# Patient Record
Sex: Female | Born: 1985 | Race: Black or African American | Hispanic: No | Smoking: Former smoker
Health system: Southern US, Community
[De-identification: ages and names within clinical notes are randomized; demographics above are authoritative.]

## PROBLEM LIST (undated history)

## (undated) ENCOUNTER — Inpatient Hospital Stay (HOSPITAL_COMMUNITY): Payer: Managed Care, Other (non HMO)

## (undated) DIAGNOSIS — F419 Anxiety disorder, unspecified: Secondary | ICD-10-CM

## (undated) DIAGNOSIS — F32A Depression, unspecified: Secondary | ICD-10-CM

## (undated) DIAGNOSIS — R569 Unspecified convulsions: Secondary | ICD-10-CM

## (undated) DIAGNOSIS — O139 Gestational [pregnancy-induced] hypertension without significant proteinuria, unspecified trimester: Secondary | ICD-10-CM

## (undated) DIAGNOSIS — F319 Bipolar disorder, unspecified: Secondary | ICD-10-CM

## (undated) DIAGNOSIS — F209 Schizophrenia, unspecified: Secondary | ICD-10-CM

## (undated) HISTORY — PX: ADENOIDECTOMY: SUR15

## (undated) HISTORY — DX: Schizophrenia, unspecified: F20.9

## (undated) HISTORY — DX: Bipolar disorder, unspecified: F31.9

---
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
## 2007-03-05 ENCOUNTER — Emergency Department (HOSPITAL_COMMUNITY): Admission: EM | Admit: 2007-03-05 | Discharge: 2007-03-05 | Payer: Self-pay | Admitting: Emergency Medicine

## 2007-08-20 ENCOUNTER — Emergency Department (HOSPITAL_COMMUNITY): Admission: EM | Admit: 2007-08-20 | Discharge: 2007-08-21 | Payer: Self-pay | Admitting: Emergency Medicine

## 2008-01-02 ENCOUNTER — Inpatient Hospital Stay (HOSPITAL_COMMUNITY): Admission: AD | Admit: 2008-01-02 | Discharge: 2008-01-02 | Payer: Self-pay | Admitting: Obstetrics & Gynecology

## 2008-08-13 ENCOUNTER — Inpatient Hospital Stay (HOSPITAL_COMMUNITY): Admission: AD | Admit: 2008-08-13 | Discharge: 2008-08-13 | Payer: Self-pay | Admitting: Obstetrics

## 2008-08-18 ENCOUNTER — Inpatient Hospital Stay (HOSPITAL_COMMUNITY): Admission: AD | Admit: 2008-08-18 | Discharge: 2008-08-19 | Payer: Self-pay | Admitting: Obstetrics

## 2008-08-19 ENCOUNTER — Inpatient Hospital Stay (HOSPITAL_COMMUNITY): Admission: AD | Admit: 2008-08-19 | Discharge: 2008-08-22 | Payer: Self-pay | Admitting: Obstetrics

## 2008-09-04 ENCOUNTER — Inpatient Hospital Stay (HOSPITAL_COMMUNITY): Admission: AD | Admit: 2008-09-04 | Discharge: 2008-09-08 | Payer: Self-pay | Admitting: Obstetrics

## 2009-02-24 ENCOUNTER — Emergency Department (HOSPITAL_COMMUNITY): Admission: EM | Admit: 2009-02-24 | Discharge: 2009-02-24 | Payer: Self-pay | Admitting: Emergency Medicine

## 2009-03-22 ENCOUNTER — Emergency Department (HOSPITAL_COMMUNITY): Admission: EM | Admit: 2009-03-22 | Discharge: 2009-03-22 | Payer: Self-pay | Admitting: Emergency Medicine

## 2009-06-02 ENCOUNTER — Emergency Department (HOSPITAL_COMMUNITY): Admission: EM | Admit: 2009-06-02 | Discharge: 2009-06-02 | Payer: Self-pay | Admitting: Emergency Medicine

## 2009-07-02 ENCOUNTER — Emergency Department (HOSPITAL_COMMUNITY): Admission: EM | Admit: 2009-07-02 | Discharge: 2009-07-02 | Payer: Self-pay | Admitting: Emergency Medicine

## 2009-08-22 ENCOUNTER — Emergency Department (HOSPITAL_COMMUNITY): Admission: EM | Admit: 2009-08-22 | Discharge: 2009-08-22 | Payer: Self-pay | Admitting: Emergency Medicine

## 2009-10-18 ENCOUNTER — Emergency Department (HOSPITAL_COMMUNITY): Admission: EM | Admit: 2009-10-18 | Discharge: 2009-10-18 | Payer: Self-pay | Admitting: Emergency Medicine

## 2010-03-23 ENCOUNTER — Emergency Department (HOSPITAL_COMMUNITY): Admission: EM | Admit: 2010-03-23 | Discharge: 2010-03-23 | Payer: Self-pay | Admitting: Emergency Medicine

## 2010-06-28 ENCOUNTER — Emergency Department (HOSPITAL_COMMUNITY): Admission: EM | Admit: 2010-06-28 | Discharge: 2010-06-28 | Payer: Self-pay | Admitting: Emergency Medicine

## 2010-08-21 ENCOUNTER — Emergency Department (HOSPITAL_COMMUNITY): Admission: EM | Admit: 2010-08-21 | Discharge: 2010-08-21 | Payer: Self-pay | Admitting: Emergency Medicine

## 2010-09-13 ENCOUNTER — Emergency Department (HOSPITAL_COMMUNITY): Admission: EM | Admit: 2010-09-13 | Discharge: 2010-09-13 | Payer: Self-pay | Admitting: Emergency Medicine

## 2010-10-14 ENCOUNTER — Emergency Department (HOSPITAL_COMMUNITY)
Admission: EM | Admit: 2010-10-14 | Discharge: 2010-10-14 | Payer: Self-pay | Source: Home / Self Care | Admitting: Emergency Medicine

## 2010-11-07 ENCOUNTER — Emergency Department (HOSPITAL_COMMUNITY)
Admission: EM | Admit: 2010-11-07 | Discharge: 2010-11-07 | Payer: Self-pay | Source: Home / Self Care | Admitting: Emergency Medicine

## 2010-12-11 ENCOUNTER — Emergency Department (HOSPITAL_COMMUNITY)
Admission: EM | Admit: 2010-12-11 | Discharge: 2010-12-11 | Discharge status: Against Medical Advice | Payer: Self-pay | Attending: Emergency Medicine | Admitting: Emergency Medicine

## 2010-12-11 DIAGNOSIS — R197 Diarrhea, unspecified: Secondary | ICD-10-CM | POA: Insufficient documentation

## 2011-01-05 ENCOUNTER — Emergency Department (HOSPITAL_COMMUNITY)
Admission: EM | Admit: 2011-01-05 | Discharge: 2011-01-06 | Disposition: A | Discharge status: Home / Self Care | Payer: Self-pay | Attending: Emergency Medicine | Admitting: Emergency Medicine

## 2011-01-05 DIAGNOSIS — Z0389 Encounter for observation for other suspected diseases and conditions ruled out: Secondary | ICD-10-CM | POA: Insufficient documentation

## 2011-01-14 LAB — RAPID STREP SCREEN (MED CTR MEBANE ONLY): Streptococcus, Group A Screen (Direct): NEGATIVE

## 2011-03-15 NOTE — H&P (Signed)
NAME:  Kathy Bird, Kathy Bird                  ACCOUNT NO.:  [id number]   MEDICAL RECORD NO.:  [id number]          PATIENT TYPE:  INP   LOCATION:  9156                          FACILITY:  [facility]   PHYSICIAN:  Roseanna Rainbow, M.D.DATE OF BIRTH:  20-Dec-1985   DATE OF ADMISSION:  09/04/2008  DATE OF DISCHARGE:                              HISTORY & PHYSICAL   CHIEF COMPLAINT:  The patient is a 25 year old several days post  discharge from her recent delivery, now with a new-onset headache.   HISTORY OF PRESENT ILLNESS:  Please see the above.  She denies any other  neurological symptoms.  Her recent delivery was uncomplicated.   PAST MEDICAL HISTORY:  There is a history of a seizure disorder.   SOCIAL HISTORY:  She is a current smoker.  She denies any drug abuse or  alcohol use.   PAST GYNECOLOGIC HISTORY:  Noncontributory.   MEDICATIONS:  Please see the medication reconciliation form.   REVIEW OF SYSTEMS:  NEUROLOGIC:  Headache.  No visual disturbances.  GI:  No epigastric pain.  PULMONARY:  No shortness of breath.   PHYSICAL EXAMINATION:  VITAL SIGNS:  Blood pressures 150-160/100-110.  GENERAL:  Mild distress.   PIH panel normal.   ASSESSMENT:  Postpartum pregnancy-induced hypertension with blood  pressure criteria for severe pregnancy-induced hypertension.   PLAN:  Admission.  Magnesium sulfate for seizure prophylaxis.  We will  start p.o. labetalol and give IV labetalol as needed for severe blood  pressure elevations.  Modified bed rest.      Roseanna Rainbow, M.D.  Electronically Signed     LAJ/MEDQ  D:  09/05/2008  T:  09/05/2008  Job:  [id number]   cc:   Kathreen Cosier, M.D.  Fax: [phone]

## 2011-03-18 NOTE — Discharge Summary (Signed)
NAME:  Kathy Bird, Kathy Bird                  ACCOUNT NO.:  [id number]   MEDICAL RECORD NO.:  [id number]          PATIENT TYPE:  INP   LOCATION:  9306                          FACILITY:  [facility]   PHYSICIAN:  Kathreen Cosier, M.D.DATE OF BIRTH:  23-Dec-1985   DATE OF ADMISSION:  09/04/2008  DATE OF DISCHARGE:  09/08/2008                               DISCHARGE SUMMARY   The patient is a 25 year old who was delivered and then readmitted  because of elevated blood pressures.  On admission, her blood pressures  were 150 to 160 over 100 to 110 and the PIH panel was normal.  She was  admitted and received magnesium sulfate 4 g loading, 2 g per hour.  She  was started on p.o. labetalol 200 mg p.o. b.i.d.  By September 05, 2008,  highest diastolic was 89 and labetalol was increased the night before it  was 3 times a day.  Magnesium was discontinued on September 06, 2008, and  highest blood pressure was 140/82.  On September 07, 2008, her blood  pressure was 129/59 to 158/99 and she was restarted on her Norvasc 10 mg  p.o. daily that she had been taking prior to pregnancy.  By September 08, 2008, her diastolics ranged between 71 to 84.  She was discharged home  on Norvasc 10 mg p.o. daily, labetalol 200 mg every 8 hours, to see me  in 1 week for blood pressure check.   DISCHARGE DIAGNOSIS:  Status post readmission for pregnancy-induced  hypertension postdelivery.           ______________________________  Kathreen Cosier, M.D.     BAM/MEDQ  D:  10/08/2008  T:  10/08/2008  Job:  [id number]

## 2011-04-29 ENCOUNTER — Inpatient Hospital Stay (HOSPITAL_COMMUNITY)
Admission: AD | Admit: 2011-04-29 | Discharge: 2011-04-29 | Disposition: A | Discharge status: Home / Self Care | Payer: Self-pay | Source: Ambulatory Visit | Attending: Obstetrics & Gynecology | Admitting: Obstetrics & Gynecology

## 2011-04-29 DIAGNOSIS — O210 Mild hyperemesis gravidarum: Secondary | ICD-10-CM

## 2011-04-29 DIAGNOSIS — R197 Diarrhea, unspecified: Secondary | ICD-10-CM | POA: Insufficient documentation

## 2011-04-29 LAB — URINALYSIS, ROUTINE W REFLEX MICROSCOPIC
Nitrite: NEGATIVE
Protein, ur: NEGATIVE mg/dL
Specific Gravity, Urine: 1.02 (ref 1.005–1.030)
Urobilinogen, UA: 1 mg/dL (ref 0.0–1.0)

## 2011-04-29 LAB — URINE MICROSCOPIC-ADD ON

## 2011-04-29 LAB — POCT PREGNANCY, URINE
Preg Test, Ur: POSITIVE
Preg Test, Ur: POSITIVE

## 2011-05-24 ENCOUNTER — Inpatient Hospital Stay (INDEPENDENT_AMBULATORY_CARE_PROVIDER_SITE_OTHER)
Admission: RE | Admit: 2011-05-24 | Discharge: 2011-05-24 | Disposition: A | Discharge status: Home / Self Care | Payer: Self-pay | Source: Ambulatory Visit | Attending: Emergency Medicine | Admitting: Emergency Medicine

## 2011-05-24 DIAGNOSIS — R109 Unspecified abdominal pain: Secondary | ICD-10-CM

## 2011-06-13 ENCOUNTER — Encounter (HOSPITAL_COMMUNITY): Payer: Self-pay

## 2011-06-13 ENCOUNTER — Inpatient Hospital Stay (HOSPITAL_COMMUNITY)
Admission: AD | Admit: 2011-06-13 | Discharge: 2011-06-13 | Disposition: A | Discharge status: Home / Self Care | Payer: Self-pay | Source: Ambulatory Visit | Attending: Obstetrics & Gynecology | Admitting: Obstetrics & Gynecology

## 2011-06-13 DIAGNOSIS — F411 Generalized anxiety disorder: Secondary | ICD-10-CM | POA: Insufficient documentation

## 2011-06-13 DIAGNOSIS — F419 Anxiety disorder, unspecified: Secondary | ICD-10-CM

## 2011-06-13 HISTORY — DX: Unspecified convulsions: R56.9

## 2011-06-13 LAB — CBC
HCT: 35.3 % — ABNORMAL LOW (ref 36.0–46.0)
Hemoglobin: 11 g/dL — ABNORMAL LOW (ref 12.0–15.0)
MCH: 26.4 pg (ref 26.0–34.0)
MCHC: 31.2 g/dL (ref 30.0–36.0)
MCV: 84.9 fL (ref 78.0–100.0)
RDW: 14.3 % (ref 11.5–15.5)

## 2011-06-13 LAB — COMPREHENSIVE METABOLIC PANEL
Albumin: 4 g/dL (ref 3.5–5.2)
Alkaline Phosphatase: 65 U/L (ref 39–117)
BUN: 10 mg/dL (ref 6–23)
Creatinine, Ser: 0.6 mg/dL (ref 0.50–1.10)
GFR calc Af Amer: >60 mL/min (ref >60)
Glucose, Bld: 92 mg/dL (ref 70–99)
Potassium: 3.8 mEq/L (ref 3.5–5.1)
Total Protein: 7.9 g/dL (ref 6.0–8.3)

## 2011-06-13 LAB — POCT PREGNANCY, URINE: Preg Test, Ur: NEGATIVE

## 2011-06-13 MED ORDER — ALPRAZOLAM 0.5 MG PO TABS: 0.5000 mg | ORAL_TABLET | Freq: Every evening | ORAL | Status: AC | PRN

## 2011-06-13 MED ORDER — SERTRALINE HCL 50 MG PO TABS: 50.0000 mg | ORAL_TABLET | Freq: Every day | ORAL | Status: DC

## 2011-06-13 NOTE — Progress Notes (Signed)
Pt states she had a terminated pregnancy on 7-5. States she did not go back for her follow up appointment. Pt states that every morning she wakes up with a rapid pulse, hearing things, anxiety and nervous and "feels like she is having a heart attack". No pain. Has had some spotting off and on.

## 2011-06-13 NOTE — ED Provider Notes (Signed)
History     No chief complaint on file.  HPI  Pt had TAB on 05/05/11. Since that time has been waking up with feeling of racing heartbeat and shortness of breath accompanied by feeling of anxiety, "I feel like I'm having a having a heart attack". Also happens when out around a lot of people. Is not having these symptoms now. States she has been feeling "down" since abortion. Did not go to follow up appointment because she "didn't want to go back to that place". No bleeding or pain.   OB History    Grav Para Term Preterm Abortions TAB SAB Ect Mult Living   2 1 1  0 1 1 0 0 0 1      Past Medical History  Diagnosis Date  . 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 last seizure when 39yrs old    History reviewed. No pertinent past surgical history.  No family history on file.  History  Substance Use Topics  . Smoking status: Current Everyday Smoker -- 0.2 packs/day for 5 years    Types: Cigarettes  . Smokeless tobacco: Never Used  . Alcohol Use: Yes     socially    Allergies: No Known Allergies  No prescriptions prior to admission    ROS  Negative except as noted in HPI Physical Exam   Blood pressure 132/93, pulse 107, temperature 97.9 F (36.6 C), temperature source Oral, resp. rate 16, height 5' 4.5" (1.638 m), weight 65.681 kg (144 lb 12.8 oz), last menstrual period 06/07/2011, SpO2 100.00%.  Physical Exam  Constitutional: She is oriented to person, place, and time. She appears well-developed and well-nourished. No distress.  Cardiovascular: Normal rate, regular rhythm and normal heart sounds.  Exam reveals no gallop and no friction rub.   No murmur heard. Respiratory: Effort normal and breath sounds normal. No respiratory distress.  Musculoskeletal: Normal range of motion.  Neurological: She is alert and oriented to person, place, and time.  Skin: Skin is warm and dry.  Psychiatric: She has a normal mood and affect. Her behavior is normal. Judgment and thought content normal.     MAU Course  Procedures   Results for orders placed during the hospital encounter of 06/13/11 (from the past 24 hour(s))  CBC     Status: Abnormal   Collection Time   06/13/11  7:07 PM      Component Value Range   WBC 5.6  4.0 - 10.5 (K/uL)   RBC 4.16  3.87 - 5.11 (MIL/uL)   Hemoglobin 11.0 (*) 12.0 - 15.0 (g/dL)   HCT 16.1 (*) 09.6 - 46.0 (%)   MCV 84.9  78.0 - 100.0 (fL)   MCH 26.4  26.0 - 34.0 (pg)   MCHC 31.2  30.0 - 36.0 (g/dL)   RDW 04.5  40.9 - 81.1 (%)   Platelets 326  150 - 400 (K/uL)  COMPREHENSIVE METABOLIC PANEL     Status: Normal   Collection Time   06/13/11  7:07 PM      Component Value Range   Sodium 140  135 - 145 (mEq/L)   Potassium 3.8  3.5 - 5.1 (mEq/L)   Chloride 105  96 - 112 (mEq/L)   CO2 27  19 - 32 (mEq/L)   Glucose, Bld 92  70 - 99 (mg/dL)   BUN 10  6 - 23 (mg/dL)   Creatinine, Ser 9.14  0.50 - 1.10 (mg/dL)   Calcium 78.2  8.4 - 10.5 (mg/dL)   Total Protein  7.9  6.0 - 8.3 (g/dL)   Albumin 4.0  3.5 - 5.2 (g/dL)   AST 14  0 - 37 (U/L)   ALT 10  0 - 35 (U/L)   Alkaline Phosphatase 65  39 - 117 (U/L)   Total Bilirubin 0.4  0.3 - 1.2 (mg/dL)   GFR calc non Af Amer >60  >60 (mL/min)   GFR calc Af Amer >60  >60 (mL/min)  POCT PREGNANCY, URINE     Status: Normal   Collection Time   06/13/11  8:09 PM      Component Value Range   Preg Test, Ur NEGATIVE       Assessment and Plan  Complete Termination Anxiety: Rx Zoloft 50mg  po Qhs, Xanax .5mg  TID prn, bridge to therapeutic FU with me in [facility] 4-6 weeks  Lucilla Petrenko E. 06/13/2011, 8:20 PM

## 2011-06-13 NOTE — ED Provider Notes (Signed)
History     No chief complaint on file.  HPI Pt had TAB on 05/05/11. Since that time has been waking up with feeling of racing heartbeat and shortness of breath accompanied by feeling of anxiety, "I feel like I'm having a having a heart attack". Also happens when out around a lot of people. Is not having these symptoms now. States she has been feeling "down" since abortion. Did not go to follow up appointment because she "didn't want to go back to that place". No bleeding or pain.   OB History    Grav Para Term Preterm Abortions TAB SAB Ect Mult Living   2 1 1  0 1 1 0 0 0 1      Past Medical History  Diagnosis Date  . 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 last seizure when 40yrs old    History reviewed. No pertinent past surgical history.  No family history on file.  History  Substance Use Topics  . Smoking status: Current Everyday Smoker -- 0.2 packs/day for 5 years    Types: Cigarettes  . Smokeless tobacco: Never Used  . Alcohol Use: Yes     socially    Allergies: No Known Allergies  No prescriptions prior to admission    ROS Negative except as noted in HPI Physical Exam   Blood pressure 126/76, pulse 83, temperature 97.9 F (36.6 C), temperature source Oral, resp. rate 20, height 5' 4.5" (1.638 m), weight 65.681 kg (144 lb 12.8 oz), last menstrual period 06/07/2011, SpO2 100.00%.  Physical Exam  Constitutional: She is oriented to person, place, and time. She appears well-developed and well-nourished. No distress.  Cardiovascular: Normal rate, regular rhythm and normal heart sounds.  Exam reveals no gallop and no friction rub.   No murmur heard. Respiratory: Effort normal and breath sounds normal. No respiratory distress.  Musculoskeletal: Normal range of motion.  Neurological: She is alert and oriented to person, place, and time.  Skin: Skin is warm and dry.  Psychiatric: She has a normal mood and affect. Her behavior is normal. Judgment and thought content normal.    MAU  Course  Procedures  Results for orders placed during the hospital encounter of 06/13/11 (from the past 24 hour(s))  CBC     Status: Abnormal   Collection Time   06/13/11  7:07 PM      Component Value Range   WBC 5.6  4.0 - 10.5 (K/uL)   RBC 4.16  3.87 - 5.11 (MIL/uL)   Hemoglobin 11.0 (*) 12.0 - 15.0 (g/dL)   HCT 16.1 (*) 09.6 - 46.0 (%)   MCV 84.9  78.0 - 100.0 (fL)   MCH 26.4  26.0 - 34.0 (pg)   MCHC 31.2  30.0 - 36.0 (g/dL)   RDW 04.5  40.9 - 81.1 (%)   Platelets 326  150 - 400 (K/uL)  COMPREHENSIVE METABOLIC PANEL     Status: Normal   Collection Time   06/13/11  7:07 PM      Component Value Range   Sodium 140  135 - 145 (mEq/L)   Potassium 3.8  3.5 - 5.1 (mEq/L)   Chloride 105  96 - 112 (mEq/L)   CO2 27  19 - 32 (mEq/L)   Glucose, Bld 92  70 - 99 (mg/dL)   BUN 10  6 - 23 (mg/dL)   Creatinine, Ser 9.14  0.50 - 1.10 (mg/dL)   Calcium 78.2  8.4 - 10.5 (mg/dL)   Total Protein 7.9  6.0 -  8.3 (g/dL)   Albumin 4.0  3.5 - 5.2 (g/dL)   AST 14  0 - 37 (U/L)   ALT 10  0 - 35 (U/L)   Alkaline Phosphatase 65  39 - 117 (U/L)   Total Bilirubin 0.4  0.3 - 1.2 (mg/dL)   GFR calc non Af Amer >60  >60 (mL/min)   GFR calc Af Amer >60  >60 (mL/min)  POCT PREGNANCY, URINE     Status: Normal   Collection Time   06/13/11  8:09 PM      Component Value Range   Preg Test, Ur NEGATIVE       Assessment and Plan  Discussed anxiety/panic attacks with patient - encouraged mental health follow up Care transferred to [facility], CNM  Sanaa Zilberman 06/14/2011, 5:02 PM

## 2011-07-14 ENCOUNTER — Encounter: Payer: Self-pay | Admitting: Obstetrics and Gynecology

## 2011-07-25 LAB — WET PREP, GENITAL

## 2011-07-25 LAB — URINALYSIS, ROUTINE W REFLEX MICROSCOPIC
Glucose, UA: NEGATIVE
Hgb urine dipstick: NEGATIVE
Ketones, ur: NEGATIVE
Protein, ur: NEGATIVE
Urobilinogen, UA: 0.2

## 2011-07-25 LAB — HCG, QUANTITATIVE, PREGNANCY: hCG, Beta Chain, Quant, S: 115832 — ABNORMAL HIGH

## 2011-07-25 LAB — CBC
HCT: 33.4 — ABNORMAL LOW
MCV: 78.3
Platelets: 306
RDW: 17 — ABNORMAL HIGH

## 2011-07-25 LAB — POCT PREGNANCY, URINE: Preg Test, Ur: POSITIVE

## 2011-08-01 LAB — CBC
HCT: 24.7 — ABNORMAL LOW
Hemoglobin: 10.1 — ABNORMAL LOW
Hemoglobin: 7.9 — CL
MCV: 79.3
RBC: 3.12 — ABNORMAL LOW
RBC: 4.09
WBC: 8.6

## 2011-08-02 LAB — CBC
HCT: 36.1
Hemoglobin: 11.1 — ABNORMAL LOW
MCHC: 30.7
MCV: 79.7
RBC: 4.53
RDW: 19.2 — ABNORMAL HIGH

## 2011-08-02 LAB — URINALYSIS, ROUTINE W REFLEX MICROSCOPIC
Nitrite: NEGATIVE
Protein, ur: NEGATIVE
Specific Gravity, Urine: 1.025
Urobilinogen, UA: 0.2

## 2011-08-02 LAB — COMPREHENSIVE METABOLIC PANEL
ALT: 15
BUN: 6
CO2: 20
Calcium: 8.9
Creatinine, Ser: 0.51
GFR calc non Af Amer: >60
Glucose, Bld: 92
Sodium: 134 — ABNORMAL LOW
Total Protein: 7.3

## 2011-08-02 LAB — URINE MICROSCOPIC-ADD ON

## 2011-08-02 LAB — LACTATE DEHYDROGENASE: LDH: 257 — ABNORMAL HIGH

## 2011-08-02 LAB — URIC ACID: Uric Acid, Serum: 4.6

## 2011-08-10 LAB — URINALYSIS, ROUTINE W REFLEX MICROSCOPIC
Ketones, ur: NEGATIVE
Nitrite: NEGATIVE
Protein, ur: NEGATIVE
pH: 6.5

## 2011-08-10 LAB — URINE CULTURE

## 2011-08-10 LAB — WET PREP, GENITAL
Trich, Wet Prep: NONE SEEN
WBC, Wet Prep HPF POC: NONE SEEN

## 2011-09-20 ENCOUNTER — Encounter (HOSPITAL_COMMUNITY): Payer: Self-pay | Admitting: Emergency Medicine

## 2011-09-20 ENCOUNTER — Encounter (HOSPITAL_COMMUNITY): Payer: Self-pay | Admitting: *Deleted

## 2011-09-20 ENCOUNTER — Emergency Department (HOSPITAL_COMMUNITY)
Admission: EM | Admit: 2011-09-20 | Discharge: 2011-09-20 | Disposition: A | Discharge status: Home / Self Care | Payer: Self-pay | Attending: Emergency Medicine | Admitting: Emergency Medicine

## 2011-09-20 ENCOUNTER — Emergency Department (HOSPITAL_COMMUNITY)
Admission: EM | Admit: 2011-09-20 | Discharge: 2011-09-20 | Discharge status: Against Medical Advice | Payer: Self-pay | Attending: Emergency Medicine | Admitting: Emergency Medicine

## 2011-09-20 DIAGNOSIS — X58XXXA Exposure to other specified factors, initial encounter: Secondary | ICD-10-CM | POA: Insufficient documentation

## 2011-09-20 DIAGNOSIS — K0889 Other specified disorders of teeth and supporting structures: Secondary | ICD-10-CM

## 2011-09-20 DIAGNOSIS — S025XXA Fracture of tooth (traumatic), initial encounter for closed fracture: Secondary | ICD-10-CM | POA: Insufficient documentation

## 2011-09-20 DIAGNOSIS — S032XXA Dislocation of tooth, initial encounter: Secondary | ICD-10-CM

## 2011-09-20 DIAGNOSIS — K089 Disorder of teeth and supporting structures, unspecified: Secondary | ICD-10-CM | POA: Insufficient documentation

## 2011-09-20 DIAGNOSIS — R6884 Jaw pain: Secondary | ICD-10-CM | POA: Insufficient documentation

## 2011-09-20 MED ORDER — OXYCODONE-ACETAMINOPHEN 5-325 MG PO TABS: ORAL_TABLET | ORAL | Status: AC

## 2011-09-20 MED ORDER — OXYCODONE-ACETAMINOPHEN 5-325 MG PO TABS
2.0000 | ORAL_TABLET | Freq: Once | ORAL | Status: AC
  Administered 2011-09-20: 2 via ORAL
  Filled 2011-09-20: qty 2

## 2011-09-20 MED ORDER — PENICILLIN V POTASSIUM 500 MG PO TABS: 500.0000 mg | ORAL_TABLET | Freq: Four times a day (QID) | ORAL | Status: AC

## 2011-09-20 NOTE — ED Provider Notes (Signed)
History     CSN: [id number] Arrival date & time: 09/20/2011  6:06 AM   First MD Initiated Contact with Patient 09/20/11 [phone]      Chief Complaint  Patient presents with  . Dental Pain    generalized jaw pain     (Consider location/radiation/quality/duration/timing/severity/associated sxs/prior treatment) HPI Comments: Patient states she was unable to sleep last evening because of pain in her upper molar area bilaterally. She states that her molars broke through her skin and she was supposed to get them extracted in the interim her insurance has been dropped gets a job change and she is unable to get her extractions. Since then pieces of both molars have fallen off. She states excruciating throbbing pain is causing her to have a headache. She is able to swallow has no trismus and her palate is soft. Patient denies fevers, night sweats, chills. She has no other current complaints.  Patient is a 25 y.o. female presenting with tooth pain. The history is provided by the patient.  Dental PainPrimary symptoms do not include sore throat.  Additional symptoms do not include: facial swelling, trouble swallowing, drooling and ear pain.    Past Medical History  Diagnosis Date  . 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 last seizure when 64yrs old    History reviewed. No pertinent past surgical history.  History reviewed. No pertinent family history.  History  Substance Use Topics  . Smoking status: Current Everyday Smoker -- 0.2 packs/day for 5 years    Types: Cigarettes  . Smokeless tobacco: Never Used  . Alcohol Use: Yes     socially    OB History    Grav Para Term Preterm Abortions TAB SAB Ect Mult Living   2 1 1  0 1 1 0 0 0 1      Review of Systems  HENT: Positive for dental problem. Negative for ear pain, congestion, sore throat, facial swelling, drooling, mouth sores, trouble swallowing, neck pain, neck stiffness and voice change.   All other systems reviewed and are  negative.    Allergies  Review of patient's allergies indicates no known allergies.  Home Medications   Current Outpatient Rx  Name Route Sig Dispense Refill  . IBUPROFEN 200 MG PO TABS Oral Take 400 mg by mouth every 6 (six) hours as needed. As needed for pain     . NAPROXEN SODIUM 220 MG PO TABS Oral Take 220 mg by mouth 2 (two) times daily with a meal. For pain     . OVER THE COUNTER MEDICATION  OTC Dayquil product     . SERTRALINE HCL 50 MG PO TABS Oral Take 1 tablet (50 mg total) by mouth daily. 30 tablet 1    BP 128/84  Pulse 90  Temp(Src) 97.5 F (36.4 C) (Oral)  Resp 20  SpO2 100%  Physical Exam  Nursing note and vitals reviewed. Constitutional: She is oriented to person, place, and time. She appears well-developed and well-nourished. No distress.  HENT:  Head: Normocephalic and atraumatic. No trismus in the jaw.  Mouth/Throat: Uvula is midline, oropharynx is clear and moist and mucous membranes are normal. Abnormal dentition. No dental abscesses or uvula swelling. No oropharyngeal exudate, posterior oropharyngeal edema, posterior oropharyngeal erythema or tonsillar abscesses.    Eyes: Conjunctivae and EOM are normal.  Neck: Normal range of motion and full passive range of motion without pain. Neck supple.  Cardiovascular: Normal rate and regular rhythm.   Pulmonary/Chest: Effort normal and breath sounds normal.  No stridor.  Musculoskeletal: Normal range of motion.  Lymphadenopathy:       Head (right side): No submental, no submandibular, no tonsillar, no preauricular and no posterior auricular adenopathy present.       Head (left side): No submental, no submandibular, no tonsillar, no preauricular and no posterior auricular adenopathy present.    She has no cervical adenopathy.  Neurological: She is alert and oriented to person, place, and time.  Skin: Skin is warm and dry. No rash noted. She is not diaphoretic.    ED Course  Procedures (including critical care  time)  Labs Reviewed - No data to display No results found.   No diagnosis found.    MDM  Dental pain        Jaci Carrel, [place] 09/20/11 [phone]

## 2011-09-20 NOTE — ED Notes (Signed)
Pt in c/o toothache x2 days, pt states pain is on both sides of her upper jaw in the back

## 2011-09-20 NOTE — ED Provider Notes (Signed)
Medical screening examination/treatment/procedure(s) were performed by non-physician practitioner and as supervising physician I was immediately available for consultation/collaboration.   Vida Roller, MD 09/20/11 [phone]

## 2011-11-21 ENCOUNTER — Emergency Department (HOSPITAL_COMMUNITY)
Admission: EM | Admit: 2011-11-21 | Discharge: 2011-11-21 | Disposition: A | Discharge status: Home / Self Care | Payer: Medicaid Other | Attending: Emergency Medicine | Admitting: Emergency Medicine

## 2011-11-21 ENCOUNTER — Encounter (HOSPITAL_COMMUNITY): Payer: Self-pay

## 2011-11-21 DIAGNOSIS — M542 Cervicalgia: Secondary | ICD-10-CM | POA: Insufficient documentation

## 2011-11-21 DIAGNOSIS — R51 Headache: Secondary | ICD-10-CM | POA: Insufficient documentation

## 2011-11-21 DIAGNOSIS — R6884 Jaw pain: Secondary | ICD-10-CM | POA: Insufficient documentation

## 2011-11-21 DIAGNOSIS — J329 Chronic sinusitis, unspecified: Secondary | ICD-10-CM

## 2011-11-21 DIAGNOSIS — K029 Dental caries, unspecified: Secondary | ICD-10-CM | POA: Insufficient documentation

## 2011-11-21 DIAGNOSIS — R569 Unspecified convulsions: Secondary | ICD-10-CM | POA: Insufficient documentation

## 2011-11-21 MED ORDER — HYDROCODONE-ACETAMINOPHEN 5-325 MG PO TABS
1.0000 | ORAL_TABLET | Freq: Once | ORAL | Status: AC
  Administered 2011-11-21: 1 via ORAL
  Filled 2011-11-21: qty 1

## 2011-11-21 MED ORDER — AMOXICILLIN-POT CLAVULANATE 875-125 MG PO TABS
1.0000 | ORAL_TABLET | ORAL | Status: AC
  Administered 2011-11-21: 1 via ORAL
  Filled 2011-11-21 (×2): qty 1

## 2011-11-21 MED ORDER — AMOXICILLIN-POT CLAVULANATE 875-125 MG PO TABS: 1.0000 | ORAL_TABLET | Freq: Two times a day (BID) | ORAL | Status: AC

## 2011-11-21 MED ORDER — HYDROCODONE-ACETAMINOPHEN 5-325 MG PO TABS: 1.0000 | ORAL_TABLET | ORAL | Status: AC | PRN

## 2011-11-21 NOTE — ED Provider Notes (Signed)
History     CSN: [id number]  Arrival date & time 11/21/11  0039   First MD Initiated Contact with Patient 11/21/11 0047      Chief Complaint  Patient presents with  . Jaw Pain    right side  . Neck Pain    right side    (Consider location/radiation/quality/duration/timing/severity/associated sxs/prior treatment) HPI Comments: Patient here with a several month history of left sided facial and neck pain - states that initially she thought the pain was related to a dental issue - states the pain now involves her entire face (forehead sparing) - she denies throat pain, fever, chills, ear pain or drainage - reports she does have some cavities but has not been able to see a dentist because of insurance issues   Patient is a 26 y.o. female presenting with neck pain. The history is provided by the patient. No language interpreter was used.  Neck Pain  This is a recurrent problem. The current episode started more than 1 week ago. The problem occurs constantly. The problem has not changed since onset.The pain is associated with nothing. There has been no fever. The pain is present in the left side. The quality of the pain is described as stabbing. The pain is at a severity of 10/10. The pain is severe. The pain is the same all the time. Stiffness is present all day. Pertinent negatives include no photophobia, no visual change, no chest pain, no syncope, no numbness, no weight loss, no headaches, no bowel incontinence, no bladder incontinence, no leg pain, no paresis, no tingling and no weakness. She has tried NSAIDs for the symptoms. The treatment provided no relief.    Past Medical History  Diagnosis Date  . 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 last seizure when 76yrs old    History reviewed. No pertinent past surgical history.  No family history on file.  History  Substance Use Topics  . Smoking status: Current Everyday Smoker -- 0.2 packs/day for 5 years    Types: Cigarettes  . Smokeless tobacco:  Never Used  . Alcohol Use: Yes     socially    OB History    Grav Para Term Preterm Abortions TAB SAB Ect Mult Living   2 1 1  0 1 1 0 0 0 1      Review of Systems  Constitutional: Negative for weight loss.  HENT: Positive for congestion, neck pain and sinus pressure. Negative for ear pain, facial swelling, trouble swallowing and dental problem.   Eyes: Negative for photophobia.  Cardiovascular: Negative for chest pain and syncope.  Gastrointestinal: Negative for bowel incontinence.  Genitourinary: Negative for bladder incontinence.  Neurological: Negative for tingling, weakness, numbness and headaches.  All other systems reviewed and are negative.    Allergies  Review of patient's allergies indicates no known allergies.  Home Medications   Current Outpatient Rx  Name Route Sig Dispense Refill  . IBUPROFEN 200 MG PO TABS Oral Take 400 mg by mouth every 6 (six) hours as needed. As needed for pain     . OVER THE COUNTER MEDICATION  OTC Dayquil product     . SERTRALINE HCL 50 MG PO TABS Oral Take 1 tablet (50 mg total) by mouth daily. 30 tablet 1    BP 133/81  Pulse 78  Temp(Src) 97.8 F (36.6 C) (Oral)  Resp 16  SpO2 100%  LMP 11/12/2011  Physical Exam  Nursing note and vitals reviewed. Constitutional: She is oriented to person,  place, and time. She appears well-developed and well-nourished. No distress.  HENT:  Head: Normocephalic and atraumatic.  Right Ear: External ear normal.  Left Ear: External ear normal.  Nose: Mucosal edema present. Right sinus exhibits no maxillary sinus tenderness and no frontal sinus tenderness. Left sinus exhibits maxillary sinus tenderness. Left sinus exhibits no frontal sinus tenderness.  Mouth/Throat: Oropharynx is clear and moist. Dental caries present.         Dental caries  Neck: Normal range of motion. Neck supple.       Left anterior cervical lymphadenopathy  Cardiovascular: Normal rate, regular rhythm and normal heart sounds.   Exam reveals no gallop and no friction rub.   No murmur heard. Pulmonary/Chest: Effort normal and breath sounds normal. No respiratory distress. She exhibits no tenderness.  Abdominal: Soft. Bowel sounds are normal. There is no tenderness.  Musculoskeletal: Normal range of motion.  Lymphadenopathy:    She has cervical adenopathy.  Neurological: She is alert and oriented to person, place, and time. No cranial nerve deficit.  Skin: Skin is warm and dry.  Psychiatric: She has a normal mood and affect. Her behavior is normal. Judgment and thought content normal.    ED Course  Procedures (including critical care time)  Labs Reviewed - No data to display No results found.   Sinusitis    MDM  Though the patient has dental caries, I believe this to be more maxiallary sinusitis.       Izola [place], [place] 11/21/11 0109

## 2011-11-21 NOTE — ED Notes (Signed)
Pt states that she has been here multiple times and dx w/ dental problems. Pt states that this is not a dental problem

## 2011-11-21 NOTE — ED Provider Notes (Signed)
Medical screening examination/treatment/procedure(s) were performed by non-physician practitioner and as supervising physician I was immediately available for consultation/collaboration.  Ethelda Chick, MD 11/21/11 [phone]

## 2011-12-22 ENCOUNTER — Emergency Department (INDEPENDENT_AMBULATORY_CARE_PROVIDER_SITE_OTHER): Payer: Medicaid Other

## 2011-12-22 ENCOUNTER — Emergency Department (INDEPENDENT_AMBULATORY_CARE_PROVIDER_SITE_OTHER)
Admission: EM | Admit: 2011-12-22 | Discharge: 2011-12-22 | Disposition: A | Discharge status: Home / Self Care | Payer: Self-pay | Source: Home / Self Care | Attending: Emergency Medicine | Admitting: Emergency Medicine

## 2011-12-22 ENCOUNTER — Encounter (HOSPITAL_COMMUNITY): Payer: Self-pay | Admitting: Emergency Medicine

## 2011-12-22 DIAGNOSIS — J310 Chronic rhinitis: Secondary | ICD-10-CM

## 2011-12-22 DIAGNOSIS — K047 Periapical abscess without sinus: Secondary | ICD-10-CM

## 2011-12-22 DIAGNOSIS — K044 Acute apical periodontitis of pulpal origin: Secondary | ICD-10-CM

## 2011-12-22 DIAGNOSIS — R51 Headache: Secondary | ICD-10-CM

## 2011-12-22 MED ORDER — DOXYCYCLINE HYCLATE 100 MG PO TABS: 100.0000 mg | ORAL_TABLET | Freq: Two times a day (BID) | ORAL | Status: AC

## 2011-12-22 MED ORDER — TRAMADOL HCL 50 MG PO TABS: 100.0000 mg | ORAL_TABLET | Freq: Three times a day (TID) | ORAL | Status: AC | PRN

## 2011-12-22 MED ORDER — PREDNISONE 5 MG PO KIT: PACK | ORAL | Status: DC

## 2011-12-22 MED ORDER — LORATADINE 10 MG PO TABS: 10.0000 mg | ORAL_TABLET | Freq: Every day | ORAL | Status: DC

## 2011-12-22 NOTE — ED Provider Notes (Signed)
Chief Complaint  Patient presents with  . Facial Pain    History of Present Illness:  Kathy Bird has had a five-month history of recurring left hemicranial pain. The pain of the entire side of the head, face, ear, jaw, and left neck. When this first began in October she went to the emergency room. She was diagnosed with a dental infection and given amoxicillin. She felt a little bit better after that. She was back at the emergency room in December and diagnosed with sinusitis, again given amoxicillin felt better for a while but for the past week she's again felt worse. The pain is worse if she chews, lies on her side, and is tender to touch. Smells also make it worse such as colognes or perfumes the pain is constant and she also notes rhinorrhea, dry nose, with yellowish drainage with sometimes some blood. She denies any diplopia or blurred vision. She hasn't had any numbness, tingling, or weakness of the face, arms, or legs. No difficulty with speech or swallowing. She denies any trouble with ambulation.  Review of Systems:  Other than noted above, the patient denies any of the following symptoms: Systemic:  No fever, chills, fatigue, photophobia, stiff neck. Eye:  No redness, eye pain, discharge, blurred vision, or diplopia. ENT:  No nasal congestion, rhinorrhea, sinus pressure or pain, sneezing, earache, or sore throat. Neuro:  No paresthesias, loss of consciousness, seizure activity, muscle weakness, trouble with coordination or gait, trouble speaking or swallowing. Psych:  No depression, anxiety or trouble sleeping.  PMFSH:  Past medical history, family history, social history, meds, and allergies were reviewed.  Physical Exam:   Vital signs:  BP 129/83  Pulse 88  Temp(Src) 98.2 F (36.8 C) (Oral)  Resp 16  SpO2 100%  LMP 12/02/2011 General:  Alert and oriented.  In no distress. Eye:  Lids and conjunctivas normal.  PERRL,  Full EOMs.  Fundi benign with normal discs and vessels. ENT:  The  entire left hemicranium was tender to palpation including the face, jaw, ear, anterior, and posterior neck. There was no swelling, no masses, no rash.  TMs and canals clear.  No intra oral lesions, pharynx clear, mucous membranes moist, she has poor dentition with multiple carious teeth which are all tender to touch. There is no swelling inside the mouth or of the floor the mouth. Neck:  Supple, full ROM, no tenderness to palpation.  No adenopathy or mass. Neuro:  Alert and orented times 3.  Speech was clear, fluent, and appropriate.  Cranial nerves intact. No pronator drift, muscle strength normal. Finger to nose normal.  DTRs 2+ . Psych:  Normal affect.  Dg Sinuses Complete  12/22/2011  *RADIOLOGY REPORT*  Clinical Data: Sinus pain, pressure.  PARANASAL SINUSES - COMPLETE 3 + VIEW  Comparison: None.  Findings: Paranasal sinuses appear clear.  No air fluid levels to suggest acute sinusitis.  Bony structures unremarkable.  IMPRESSION: No evidence of sinusitis.  Original Report Authenticated By: Cyndie Chime, M.D.     Assessment:   Diagnoses that have been ruled out:  None  Diagnoses that are still under consideration:  None  Final diagnoses:  Dental infection  Rhinitis  Headache - headache could be multifactorial. Her sinus films were negative, but I'm not certain that part of it isn't due to rhinitis, partially to dental infection, and also possibly partially to facial or cranial neuritis. I think she needs followup with a dentist and a neurologist.     Plan:  1.  The following meds were prescribed:   New Prescriptions   DOXYCYCLINE (VIBRA-TABS) 100 MG TABLET    Take 1 tablet (100 mg total) by mouth 2 (two) times daily.   LORATADINE (CLARITIN) 10 MG TABLET    Take 1 tablet (10 mg total) by mouth daily.   PREDNISONE 5 MG KIT    Prednisone 5 mg 6 day dosepack.  Take as directed.   TRAMADOL (ULTRAM) 50 MG TABLET    Take 2 tablets (100 mg total) by mouth every 8 (eight) hours as needed for  pain.   2.  The patient was instructed in symptomatic care and handouts were given. 3.  The patient was told to return if becoming worse in any way, if no better in 3 or 4 days, and given some red flag symptoms that would indicate earlier return.  Follow up:  The patient was told to follow up she was given the name of Dr. Yolonda Kida, a neurologist to followup with, and also from dentistry Dr. Lucky Cowboy.     Roque Lias, MD 12/22/11 2226

## 2011-12-22 NOTE — ED Notes (Signed)
PT HERE WITH LEFT SIDE FACIAL PAIN RADIATING TO EAR AND NECK THAT STARTED X 1 WEEK AGO.PT WAS SEEN HERE X 1 MNTH AGO AND DIAG WITH SINUS INFECTION AND PRESCRIBED ATB AND PAIN MEDS BUT PT STATES NOTHING IS WORKING.PT IS TAKING OTC SUDAFED AND ALEVE CONGESTION MED BUT NO RELIEF.NO N/V/ OR FEVERS REPORTED.

## 2011-12-22 NOTE — Discharge Instructions (Signed)

## 2012-06-17 ENCOUNTER — Encounter (HOSPITAL_COMMUNITY): Payer: Self-pay | Admitting: *Deleted

## 2012-06-17 ENCOUNTER — Emergency Department (HOSPITAL_COMMUNITY)
Admission: EM | Admit: 2012-06-17 | Discharge: 2012-06-18 | Disposition: A | Discharge status: Home / Self Care | Payer: Self-pay | Attending: Emergency Medicine | Admitting: Emergency Medicine

## 2012-06-17 DIAGNOSIS — K029 Dental caries, unspecified: Secondary | ICD-10-CM | POA: Insufficient documentation

## 2012-06-17 DIAGNOSIS — F17200 Nicotine dependence, unspecified, uncomplicated: Secondary | ICD-10-CM | POA: Insufficient documentation

## 2012-06-17 NOTE — ED Notes (Signed)
Pt states that her left upper wisdom tooth has been hurting for the past 2 weeks. Pt tooth does appear blackened.

## 2012-06-18 ENCOUNTER — Emergency Department (HOSPITAL_COMMUNITY)
Admission: EM | Admit: 2012-06-18 | Discharge: 2012-06-18 | Disposition: A | Discharge status: Home / Self Care | Payer: Medicaid Other | Attending: Emergency Medicine | Admitting: Emergency Medicine

## 2012-06-18 DIAGNOSIS — Z0389 Encounter for observation for other suspected diseases and conditions ruled out: Secondary | ICD-10-CM | POA: Insufficient documentation

## 2012-06-18 MED ORDER — PENICILLIN V POTASSIUM 250 MG PO TABS
500.0000 mg | ORAL_TABLET | Freq: Once | ORAL | Status: AC
  Administered 2012-06-18: 500 mg via ORAL
  Filled 2012-06-18: qty 2

## 2012-06-18 MED ORDER — PENICILLIN V POTASSIUM 500 MG PO TABS: 500.0000 mg | ORAL_TABLET | Freq: Four times a day (QID) | ORAL | Status: AC

## 2012-06-18 MED ORDER — TRAMADOL HCL 50 MG PO TABS
50.0000 mg | ORAL_TABLET | Freq: Once | ORAL | Status: AC
  Administered 2012-06-18: 50 mg via ORAL
  Filled 2012-06-18: qty 1

## 2012-06-18 MED ORDER — TRAMADOL HCL 50 MG PO TABS: 50.0000 mg | ORAL_TABLET | Freq: Once | ORAL | Status: AC

## 2012-06-18 NOTE — ED Provider Notes (Signed)
History     CSN: [id number]  Arrival date & time 06/17/12  2345   First MD Initiated Contact with Patient 06/18/12 0009      Chief Complaint  Patient presents with  . Dental Pain    (Consider location/radiation/quality/duration/timing/severity/associated sxs/prior treatment) HPI Comments: Patient is a large cavity in her left lower second molar has become painful over the last  Patient is a 26 y.o. female presenting with tooth pain. The history is provided by the patient.  Dental PainThe primary symptoms include mouth pain. Primary symptoms do not include dental injury, headaches, fever or sore throat. The symptoms began 2 days ago. The symptoms are unchanged.  Additional symptoms do not include: trouble swallowing.    Past Medical History  Diagnosis Date  . 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 last seizure when 35yrs old    History reviewed. No pertinent past surgical history.  History reviewed. No pertinent family history.  History  Substance Use Topics  . Smoking status: Current Everyday Smoker -- 0.2 packs/day for 5 years    Types: Cigarettes  . Smokeless tobacco: Never Used  . Alcohol Use: No     socially    OB History    Grav Para Term Preterm Abortions TAB SAB Ect Mult Living   2 1 1  0 1 1 0 0 0 1      Review of Systems  Constitutional: Negative for fever.  HENT: Positive for dental problem. Negative for sore throat and trouble swallowing.   Neurological: Negative for dizziness and headaches.    Allergies  Review of patient's allergies indicates no known allergies.  Home Medications   Current Outpatient Rx  Name Route Sig Dispense Refill  . IBUPROFEN 200 MG PO TABS Oral Take 800 mg by mouth every 6 (six) hours as needed. As needed for pain      BP 125/82  Pulse 78  Temp 98.1 F (36.7 C) (Oral)  Resp 16  SpO2 100%  Physical Exam  Constitutional: She appears well-developed and well-nourished.  HENT:  Head: Normocephalic.  Mouth/Throat:    Eyes:  Pupils are equal, round, and reactive to light.  Neck: Normal range of motion.  Cardiovascular: Normal rate.   Pulmonary/Chest: Effort normal.  Musculoskeletal: Normal range of motion.  Neurological: She is alert.  Skin: Skin is warm.    ED Course  Procedures (including critical care time)  Labs Reviewed - No data to display No results found.   No diagnosis found.    MDM   Patient really recently had a similar situation with her lower teeth on the right side, and she had a dentist to extract these approximately 2 weeks, ago.  I encouraged her to make an appointment with his dentist in about one week        Arman Filter, NP 06/25/12 [phone]

## 2012-06-18 NOTE — ED Notes (Signed)
The patient is AOx4 and comfortable with her discharge instructions. 

## 2012-06-26 NOTE — ED Provider Notes (Signed)
Medical screening examination/treatment/procedure(s) were performed by non-physician practitioner and as supervising physician I was immediately available for consultation/collaboration.   Tobin Chad, MD 06/26/12 [phone]

## 2012-06-30 ENCOUNTER — Encounter (HOSPITAL_COMMUNITY): Payer: Self-pay | Admitting: Emergency Medicine

## 2012-06-30 ENCOUNTER — Emergency Department (HOSPITAL_COMMUNITY)
Admission: EM | Admit: 2012-06-30 | Discharge: 2012-06-30 | Disposition: A | Discharge status: Home / Self Care | Payer: 59 | Attending: Emergency Medicine | Admitting: Emergency Medicine

## 2012-06-30 DIAGNOSIS — T40605A Adverse effect of unspecified narcotics, initial encounter: Secondary | ICD-10-CM | POA: Insufficient documentation

## 2012-06-30 DIAGNOSIS — T7840XA Allergy, unspecified, initial encounter: Secondary | ICD-10-CM | POA: Insufficient documentation

## 2012-06-30 DIAGNOSIS — Z87891 Personal history of nicotine dependence: Secondary | ICD-10-CM | POA: Insufficient documentation

## 2012-06-30 MED ORDER — PREDNISONE 20 MG PO TABS
60.0000 mg | ORAL_TABLET | Freq: Once | ORAL | Status: AC
Start: 2012-06-30 — End: 2012-06-30
  Administered 2012-06-30: 60 mg via ORAL
  Filled 2012-06-30: qty 3

## 2012-06-30 MED ORDER — DIPHENHYDRAMINE HCL 25 MG PO TABS: 25.0000 mg | ORAL_TABLET | Freq: Four times a day (QID) | ORAL | Status: DC

## 2012-06-30 MED ORDER — RANITIDINE HCL 150 MG PO TABS: 150.0000 mg | ORAL_TABLET | Freq: Two times a day (BID) | ORAL | Status: DC

## 2012-06-30 MED ORDER — IBUPROFEN 400 MG PO TABS
600.0000 mg | ORAL_TABLET | Freq: Once | ORAL | Status: AC
  Administered 2012-06-30: 600 mg via ORAL
  Filled 2012-06-30: qty 1

## 2012-06-30 MED ORDER — EPINEPHRINE 0.3 MG/0.3ML IJ DEVI: 0.3000 mg | INTRAMUSCULAR | Status: DC | PRN

## 2012-06-30 MED ORDER — RANITIDINE HCL 150 MG/10ML PO SYRP
150.0000 mg | ORAL_SOLUTION | ORAL | Status: AC
  Administered 2012-06-30: 150 mg via ORAL
  Filled 2012-06-30: qty 10

## 2012-06-30 MED ORDER — PREDNISONE 50 MG PO TABS: 50.0000 mg | ORAL_TABLET | Freq: Every day | ORAL | Status: DC

## 2012-06-30 MED ORDER — DIPHENHYDRAMINE HCL 25 MG PO CAPS
50.0000 mg | ORAL_CAPSULE | Freq: Once | ORAL | Status: AC
  Administered 2012-06-30: 50 mg via ORAL
  Filled 2012-06-30: qty 2

## 2012-06-30 MED ORDER — HYDROCODONE-ACETAMINOPHEN 5-325 MG PO TABS: 2.0000 | ORAL_TABLET | ORAL | Status: AC | PRN

## 2012-06-30 MED ORDER — IBUPROFEN 600 MG PO TABS: 600.0000 mg | ORAL_TABLET | Freq: Four times a day (QID) | ORAL | Status: AC | PRN

## 2012-06-30 NOTE — ED Notes (Addendum)
Pt reports seen here for toothache recently and given prescription medications. 2 days ago pt began with itching, rash, swelling to top lip and tongue. Pt denies shortness of breath or tongue swelling at present. Pt talking in complete sentences without difficulty at present. Pt also reports sinus pain onset yesterday. Pt has been taking sudafed without relief.

## 2012-06-30 NOTE — ED Provider Notes (Signed)
History  This chart was scribed for [name], MD by Erskine Emery. This patient was seen in room TR09C/TR09C and the patient's care was started at 13:10.   CSN: [id number]  Arrival date & time 06/30/12  1217   None     Chief Complaint  Patient presents with  . Rash  . Allergic Reaction  . Oral Swelling    (Consider location/radiation/quality/duration/timing/severity/associated sxs/prior treatment) The history is provided by the patient. No language interpreter was used.  Kathy Bird is a 26 y.o. female who presents to the Emergency Department complaining of an allergic reaction to Tramadol. Pt was seen here for a dental problem last week, and was prescribed Tramadol and pencilin. Pt reports she has been taking the penicillin and that is fine but she reports as soon as she started taking the Tramadol her skin started itching. When she stopped taking the Tramadol her symptoms went away until last night when she began taking it again and now is experiencing some lip swelling, hives on the arms, tongue itching, and facial pain and swelling (mostly on the right) but no difficulty breathing. Pt reports she last took the tramadol at work today and took benadryl around 4 am with only mild relief from symptoms. Pt claims she is having her tooth pulled in 2 weeks. Pt is not driving home but does have work this afternoon.   Past Medical History  Diagnosis Date  . 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 last seizure when 39yrs old    History reviewed. No pertinent past surgical history.  History reviewed. No pertinent family history.  History  Substance Use Topics  . Smoking status: Former Smoker -- 0.2 packs/day for 5 years    Types: Cigarettes    Quit date: 06/23/2012  . Smokeless tobacco: Never Used  . Alcohol Use: No     socially    OB History    Grav Para Term Preterm Abortions TAB SAB Ect Mult Living   2 1 1  0 1 1 0 0 0 1      Review of Systems  Constitutional: Negative for fever and  chills.  HENT: Positive for facial swelling, dental problem and sinus pressure. Negative for trouble swallowing.   Respiratory: Negative for apnea and shortness of breath.     Allergies  Review of patient's allergies indicates no known allergies.  Home Medications   Current Outpatient Rx  Name Route Sig Dispense Refill  . IBUPROFEN 200 MG PO TABS Oral Take 800 mg by mouth every 6 (six) hours as needed. As needed for pain      BP 128/85  Pulse 82  Temp 98.4 F (36.9 C) (Oral)  Resp 18  SpO2 99%  LMP 06/24/2012  Physical Exam  Nursing note and vitals reviewed. Constitutional: She is oriented to person, place, and time. She appears well-developed and well-nourished. No distress.  HENT:  Head: Normocephalic and atraumatic.       Musosa looks normal, no swelling. No tonsolar or uvula swelling. Left lower molar, some denitition abnormalities with hyperpigmentation. No gingiva sweling on the buccal side. No swelling on the lingual side.   Eyes: EOM are normal.  Neck: Neck supple. No tracheal deviation present.  Cardiovascular: Normal rate.   Pulmonary/Chest: Effort normal. No respiratory distress.       Lungs are clean to asuc bilat, no rhonchim, rales, or wheezing. RRR-murmors, no stridor.  ENTL right side of maxcillary sinus is mildly swollen.   Musculoskeletal: Normal range of  motion.  Neurological: She is alert and oriented to person, place, and time.  Skin: Skin is warm and dry.  Psychiatric: She has a normal mood and affect. Her behavior is normal.    ED Course  Procedures (including critical care time) DIAGNOSTIC STUDIES: Oxygen Saturation is 99% on room air, normal by my interpretation.    COORDINATION OF CARE: 13:10--I evaluated the patient and we discussed a treatment plan including pain medication and cessation of tramadol to which the pt agreed. I told the pt that it looks like this is an allergic reaction to Tramadol and she should remember that.    Labs  Reviewed - No data to display No results found.   No diagnosis found.    MDM  Pt comes in with cc of allergic reaction. Right top lip is swollen, but the mucosal exam is unremarkable and there is no respiratory distress or wheezing, stridors. Will treat as allergic rxn.   Medical screening examination/treatment/procedure(s) were conducted by me as a physician. Scribe utilized for documentation purposes only.  [name], MD 06/30/12 1415

## 2012-08-06 ENCOUNTER — Other Ambulatory Visit: Payer: Self-pay | Admitting: General Practice

## 2012-08-08 ENCOUNTER — Inpatient Hospital Stay: Admission: RE | Admit: 2012-08-08 | Payer: 59 | Source: Ambulatory Visit

## 2012-08-12 ENCOUNTER — Encounter (HOSPITAL_COMMUNITY): Payer: Self-pay | Admitting: *Deleted

## 2012-08-12 ENCOUNTER — Emergency Department (HOSPITAL_COMMUNITY)
Admission: EM | Admit: 2012-08-12 | Discharge: 2012-08-12 | Disposition: A | Discharge status: Home / Self Care | Payer: Self-pay | Attending: Emergency Medicine | Admitting: Emergency Medicine

## 2012-08-12 DIAGNOSIS — H9209 Otalgia, unspecified ear: Secondary | ICD-10-CM | POA: Insufficient documentation

## 2012-08-12 DIAGNOSIS — J329 Chronic sinusitis, unspecified: Secondary | ICD-10-CM | POA: Insufficient documentation

## 2012-08-12 DIAGNOSIS — Z87891 Personal history of nicotine dependence: Secondary | ICD-10-CM | POA: Insufficient documentation

## 2012-08-12 MED ORDER — IBUPROFEN 600 MG PO TABS: 600.0000 mg | ORAL_TABLET | Freq: Three times a day (TID) | ORAL | Status: DC | PRN

## 2012-08-12 MED ORDER — IBUPROFEN 400 MG PO TABS
600.0000 mg | ORAL_TABLET | Freq: Once | ORAL | Status: AC
  Administered 2012-08-12: 600 mg via ORAL
  Filled 2012-08-12: qty 1

## 2012-08-12 MED ORDER — SALINE NASAL SPRAY 0.65 % NA SOLN: 1.0000 | NASAL | Status: DC | PRN

## 2012-08-12 MED ORDER — AMOXICILLIN 250 MG PO CAPS: 500.0000 mg | ORAL_CAPSULE | Freq: Two times a day (BID) | ORAL | Status: DC

## 2012-08-12 NOTE — ED Notes (Signed)
Pt is here with right sided sinus issues

## 2012-08-12 NOTE — ED Provider Notes (Signed)
History   This chart was scribed for Kathy Kaplan, MD by Kathy Bird . Kathy patient was seen in room TR09C/TR09C. Patient's care was started at 1332.   CSN: [id number] Arrival date & time 08/12/12  1310  First MD Initiated Contact with Patient 08/12/12 1332      Chief Complaint  Patient presents with  . sinus pain on right side    HPI Comments: Kathy Bird Kathy a 26 y.o. female who presents to [facility] complaining of right sided sinus pain and pressure for Kathy past week. She reports associated right ear pain, headache, neck pain, congestion, rhinorrhea. She reports yellow/green nasal mucous. She states her symptoms are worsening. She denies any coughing. She has taken Sudafed with short-lived relief. No prior medical hx.   Patient Kathy a 26 y.o. female presenting with URI. Kathy history Kathy provided by Kathy patient. No language interpreter was used.  URI Kathy primary symptoms include ear pain. Primary symptoms do not include fever, cough, nausea or vomiting. Kathy current episode started 6 to 7 days ago. This Kathy a new problem. Kathy problem has been gradually worsening.  Kathy right ear Kathy affected.  Symptoms associated with Kathy illness include facial pain, sinus pressure, congestion and rhinorrhea. Kathy illness Kathy not associated with chills. Kathy following treatments were addressed: A decongestant was ineffective.    Past Medical History  Diagnosis Date  . 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 last seizure when 19yrs old    History reviewed. No pertinent past surgical history.  No family history on file.  History  Substance Use Topics  . Smoking status: Former Smoker -- 0.2 packs/day for 5 years    Types: Cigarettes    Quit date: 06/23/2012  . Smokeless tobacco: Never Used  . Alcohol Use: No     socially    OB History    Grav Para Term Preterm Abortions TAB SAB Ect Mult Living   2 1 1  0 1 1 0 0 0 1      Review of Systems  Constitutional: Negative for fever and chills.    HENT: Positive for ear pain, congestion, rhinorrhea, neck pain and sinus pressure.   Respiratory: Negative for cough and shortness of breath.   Gastrointestinal: Negative for nausea and vomiting.  Neurological: Negative for weakness.  All other systems reviewed and are negative.    Allergies  Review of patient's allergies indicates no known allergies.  Home Medications   Current Outpatient Rx  Name Route Sig Dispense Refill  . EPINEPHRINE 0.3 MG/0.3ML IJ DEVI Intramuscular Inject 0.3 mLs (0.3 mg total) into Kathy muscle as needed. 2 Device 1  . IBUPROFEN 200 MG PO TABS Oral Take 800 mg by mouth every 6 (six) hours as needed. As needed for pain    . PSEUDOEPHEDRINE HCL 30 MG PO TABS Oral Take 30 mg by mouth every 4 (four) hours as needed. For congestion    . DIPHENHYDRAMINE HCL 25 MG PO TABS Oral Take 1 tablet (25 mg total) by mouth every 6 (six) hours. 20 tablet 0    BP 132/87  Pulse 94  Temp 97.8 F (36.6 C) (Oral)  Resp 18  SpO2 100%  LMP 07/17/2012  Physical Exam  Nursing note and vitals reviewed. Constitutional: She Kathy oriented to person, place, and time. She appears well-developed and well-nourished. No distress.  HENT:  Head: Normocephalic and atraumatic.  Nose: Rhinorrhea present.  Mouth/Throat: Posterior oropharyngeal erythema present.  Bilaterally ear exam, positive light reflex. No erythema or exudates. Mild tonsillar erythema with swelling. No tonsillar exudates. Left nare has purulent discharge. Right nare Kathy clear.   Eyes: EOM are normal. Pupils are equal, round, and reactive to light.  Neck: Neck supple. No tracheal deviation present.       Anterior cervical adenopathy.   Cardiovascular: Normal rate, regular rhythm and normal heart sounds.   No murmur heard. Pulmonary/Chest: Effort normal and breath sounds normal. No respiratory distress. She has no wheezes. She has no rhonchi. She has no rales.  Musculoskeletal: Normal range of motion.  Lymphadenopathy:     She has cervical adenopathy.  Neurological: She Kathy alert and oriented to person, place, and time.  Skin: Skin Kathy warm and dry.  Psychiatric: She has a normal mood and affect. Her behavior Kathy normal.    ED Course  Procedures (including critical care time)  DIAGNOSTIC STUDIES: Oxygen Saturation Kathy 100% on room air, normal by my interpretation.    COORDINATION OF CARE:  13:35-Discussed planned course of treatment with Kathy patient including Motrin and antibiotics, who Kathy agreeable at this time.   14:00-Medication Orders: Ibuprofen (Advil, Motrin) tablet 600 mg-once.   No diagnosis found.    MDM  Medical screening examination/treatment/procedure(s) were performed by me as Kathy supervising physician. Scribe service was utilized for documentation only.  Pt comes in with cc of sinus pain. Has right sided sinus pain, over Kathy maxilla as well. She also has some ear pain, with no exam evidence of fluid in Kathy middle ear. Likely a sinusitis, possibly bacterial given mucopurulent discharge, now for a week. Will given AB.   Kathy Kaplan, MD 08/12/12 1357

## 2012-08-13 ENCOUNTER — Emergency Department (HOSPITAL_COMMUNITY)
Admission: EM | Admit: 2012-08-13 | Discharge: 2012-08-13 | Disposition: A | Discharge status: Home / Self Care | Payer: 59

## 2012-08-13 NOTE — ED Notes (Signed)
Called patient again and looked outside. No answer. RN [facility] notified.

## 2012-08-13 NOTE — ED Notes (Signed)
Called and checked waiting room and outside. Pt was not around.

## 2012-08-13 NOTE — ED Notes (Signed)
Called for patient again. Still no response. RN Rolly Salter notified.

## 2012-08-18 ENCOUNTER — Emergency Department (HOSPITAL_COMMUNITY)
Admission: EM | Admit: 2012-08-18 | Discharge: 2012-08-18 | Disposition: A | Discharge status: Home / Self Care | Payer: 59 | Attending: Emergency Medicine | Admitting: Emergency Medicine

## 2012-08-18 ENCOUNTER — Ambulatory Visit
Admission: RE | Admit: 2012-08-18 | Discharge: 2012-08-18 | Disposition: A | Discharge status: Home / Self Care | Payer: 59 | Source: Ambulatory Visit | Attending: General Practice | Admitting: General Practice

## 2012-08-18 ENCOUNTER — Encounter (HOSPITAL_COMMUNITY): Payer: Self-pay | Admitting: *Deleted

## 2012-08-18 DIAGNOSIS — Z79899 Other long term (current) drug therapy: Secondary | ICD-10-CM | POA: Insufficient documentation

## 2012-08-18 DIAGNOSIS — J392 Other diseases of pharynx: Secondary | ICD-10-CM | POA: Insufficient documentation

## 2012-08-18 DIAGNOSIS — R51 Headache: Secondary | ICD-10-CM | POA: Insufficient documentation

## 2012-08-18 MED ORDER — METOCLOPRAMIDE HCL 5 MG/ML IJ SOLN
10.0000 mg | Freq: Once | INTRAMUSCULAR | Status: AC
  Administered 2012-08-18: 10 mg via INTRAVENOUS
  Filled 2012-08-18: qty 2

## 2012-08-18 MED ORDER — KETOROLAC TROMETHAMINE 30 MG/ML IJ SOLN
30.0000 mg | Freq: Once | INTRAMUSCULAR | Status: AC
  Administered 2012-08-18: 30 mg via INTRAVENOUS
  Filled 2012-08-18: qty 1

## 2012-08-18 MED ORDER — SODIUM CHLORIDE 0.9 % IV BOLUS (SEPSIS)
1000.0000 mL | Freq: Once | INTRAVENOUS | Status: AC
  Administered 2012-08-18: 1000 mL via INTRAVENOUS

## 2012-08-18 NOTE — ED Notes (Signed)
Pt reports frontal headache x 1 week, states was seen and given rx for antibiotics and pain medication for sinus infection. Pt reports blurred vision to right eye. States had MRI done this afternoon, does not have results. Reports it feels like her sinuses feel dry. Denies and drainage. Denies fever.

## 2012-08-18 NOTE — ED Provider Notes (Signed)
History     CSN: [id number]  Arrival date & time 08/18/12  1653   First MD Initiated Contact with Patient 08/18/12 1924      Chief Complaint  Patient presents with  . Headache    (Consider location/radiation/quality/duration/timing/severity/associated sxs/prior treatment) HPI Pt with about 3 weeks of persistent severe throbbing R frontal headache, radiating into R neck. Seen in the ED a week ago for same and felt to have sinusitis. No improvement with Abx. Seen by a PCP earlier this week who ordered and MRI which was done this morning. She does not know results. Pain is associated with photophobia and scotoma in the R eye.   Past Medical History  Diagnosis Date  . 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 last seizure when 75yrs old    History reviewed. No pertinent past surgical history.  History reviewed. No pertinent family history.  History  Substance Use Topics  . Smoking status: Former Smoker -- 0.2 packs/day for 5 years    Types: Cigarettes    Quit date: 06/23/2012  . Smokeless tobacco: Never Used  . Alcohol Use: No     socially    OB History    Grav Para Term Preterm Abortions TAB SAB Ect Mult Living   2 1 1  0 1 1 0 0 0 1      Review of Systems All other systems reviewed and are negative except as noted in HPI.   Allergies  Review of patient's allergies indicates no known allergies.  Home Medications   Current Outpatient Rx  Name Route Sig Dispense Refill  . AMOXICILLIN 250 MG PO CAPS Oral Take 2 capsules (500 mg total) by mouth 2 (two) times daily. 28 capsule 0  . EPINEPHRINE 0.3 MG/0.3ML IJ DEVI Intramuscular Inject 0.3 mLs (0.3 mg total) into the muscle as needed. 2 Device 1  . IBUPROFEN 600 MG PO TABS Oral Take 1 tablet (600 mg total) by mouth every 8 (eight) hours as needed for pain. 30 tablet 0  . PANTOPRAZOLE SODIUM 40 MG PO TBEC Oral Take 40 mg by mouth daily.    Marland Kitchen PSEUDOEPHEDRINE HCL 30 MG PO TABS Oral Take 30 mg by mouth every 4 (four) hours as needed. For  congestion    . SALINE NASAL SPRAY 0.65 % NA SOLN Nasal Place 1 spray into the nose as needed for congestion. 30 mL 12  . DIPHENHYDRAMINE HCL 25 MG PO TABS Oral Take 1 tablet (25 mg total) by mouth every 6 (six) hours. 20 tablet 0    BP 110/78  Pulse 81  Temp 98 F (36.7 C) (Oral)  Resp 17  SpO2 100%  LMP 07/17/2012  Physical Exam  Nursing note and vitals reviewed. Constitutional: She is oriented to person, place, and time. She appears well-developed and well-nourished.  HENT:  Head: Normocephalic and atraumatic.  Eyes: EOM are normal. Pupils are equal, round, and reactive to light.  Neck: Normal range of motion. Neck supple.  Cardiovascular: Normal rate, normal heart sounds and intact distal pulses.   Pulmonary/Chest: Effort normal and breath sounds normal.  Abdominal: Bowel sounds are normal. She exhibits no distension. There is no tenderness.  Musculoskeletal: Normal range of motion. She exhibits no edema and no tenderness.  Neurological: She is alert and oriented to person, place, and time. She has normal strength. No cranial nerve deficit or sensory deficit.  Skin: Skin is warm and dry. No rash noted.  Psychiatric: She has a normal mood and affect.  ED Course  Procedures (including critical care time)  Labs Reviewed - No data to display Mr Brain Wo Contrast  08/18/2012  *RADIOLOGY REPORT*  Clinical Data: Severe headache  MRI HEAD WITHOUT CONTRAST  Technique:  Multiplanar, multiecho pulse sequences of the brain and surrounding structures were obtained according to standard protocol without intravenous contrast.  Comparison: None.  Findings: Prominent soft tissue in the posterior nasopharynx measures 12 x 24 mm.  This may be a mass lesion or adenoid hypertrophy.  Direct  visualization and  possible biopsy is suggested to rule out nasal pharyngeal carcinoma.  Ventricle size is normal.  Negative for Chiari malformation. Pituitary is normal.  Negative for acute or chronic  infarct.  Cerebral white matter is normal.  Negative for intracranial hemorrhage.  Paranasal sinuses are clear. Mastoid sinuses are clear bilaterally.  IMPRESSION: Normal MRI of the brain.  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  Recommend direct visualization and possible biopsy.   Original Report Authenticated By: Camelia Phenes, M.D.      No diagnosis found.    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 No concern for [facility] or meningitis. Discussed MRI findings with patient and recommended ENT followup.   10:01 PM Headache resolved. Advised ENT followup.       Charles B. Bernette Mayers, MD 08/18/12 2201

## 2012-08-18 NOTE — ED Notes (Signed)
Pt. States "I feel a lot better!". Denies pain. Laughing and talking with friend in room.

## 2012-08-18 NOTE — ED Notes (Signed)
Pt. Here for headache x3 weeks. States had an MRI this afternoon but does not know results. Seen here x2 weeks ago for same, given antibiotics and ibuprofen for sinus infection. States pain in now in occipital region radiating to behind right ear and to right eye. States when HA gets severe, blurred vision in right eye.

## 2012-08-18 NOTE — ED Notes (Signed)
Pt. Alert and oriented x4. Denies pain. Stable condition.

## 2012-08-25 ENCOUNTER — Encounter (HOSPITAL_COMMUNITY): Payer: Self-pay | Admitting: Physical Medicine and Rehabilitation

## 2012-08-25 ENCOUNTER — Emergency Department (HOSPITAL_COMMUNITY)
Admission: EM | Admit: 2012-08-25 | Discharge: 2012-08-26 | Disposition: A | Discharge status: Home / Self Care | Payer: 59 | Attending: Emergency Medicine | Admitting: Emergency Medicine

## 2012-08-25 DIAGNOSIS — R51 Headache: Secondary | ICD-10-CM

## 2012-08-25 DIAGNOSIS — Z8669 Personal history of other diseases of the nervous system and sense organs: Secondary | ICD-10-CM | POA: Insufficient documentation

## 2012-08-25 DIAGNOSIS — Z87891 Personal history of nicotine dependence: Secondary | ICD-10-CM | POA: Insufficient documentation

## 2012-08-25 DIAGNOSIS — J329 Chronic sinusitis, unspecified: Secondary | ICD-10-CM

## 2012-08-25 DIAGNOSIS — Z79899 Other long term (current) drug therapy: Secondary | ICD-10-CM | POA: Insufficient documentation

## 2012-08-25 MED ORDER — DIPHENHYDRAMINE HCL 50 MG/ML IJ SOLN
25.0000 mg | Freq: Once | INTRAMUSCULAR | Status: AC
  Administered 2012-08-25: 25 mg via INTRAVENOUS
  Filled 2012-08-25: qty 1

## 2012-08-25 MED ORDER — IBUPROFEN 200 MG PO TABS
400.0000 mg | ORAL_TABLET | Freq: Once | ORAL | Status: AC
  Administered 2012-08-25: 400 mg via ORAL
  Filled 2012-08-25: qty 2

## 2012-08-25 MED ORDER — KETOROLAC TROMETHAMINE 30 MG/ML IJ SOLN
30.0000 mg | Freq: Once | INTRAMUSCULAR | Status: AC
  Administered 2012-08-25: 30 mg via INTRAVENOUS
  Filled 2012-08-25: qty 1

## 2012-08-25 MED ORDER — METOCLOPRAMIDE HCL 5 MG/ML IJ SOLN
10.0000 mg | Freq: Once | INTRAMUSCULAR | Status: AC
  Administered 2012-08-25: 10 mg via INTRAVENOUS
  Filled 2012-08-25: qty 2

## 2012-08-25 MED ORDER — SALINE SPRAY 0.65 % NA SOLN
1.0000 | NASAL | Status: DC | PRN
  Filled 2012-08-25: qty 44

## 2012-08-25 NOTE — ED Provider Notes (Signed)
History     CSN: [id number]  Arrival date & time 08/25/12  1801   First MD Initiated Contact with Patient 08/25/12 2322      Chief Complaint  Patient presents with  . Headache    (Consider location/radiation/quality/duration/timing/severity/associated sxs/prior treatment) HPI Comments: Patient reports that she has had problems with headaches which are thought to be migraine headaches, treated here in the emergency department previously. She has also been suffering with sinus problems which he describes pressure over the right side of her face and around her on not associated with any visual disturbances. She reports that it is a dry sensation especially on the right side of her nose and down her throat. She describes some discomfort on the right side of her neck and face. She reports she is able to breathe through her nose pretty effectively. She denies a stiff neck or rash. She has been taking ibuprofen with minimal relief. She reports she thinks to sinus problems has trigger a migraine which actually affects more in the left side by her indication. In review of prior ED visits, the patient has been seen for generalized headache as well as sinus problems and recommended to take pseudoephedrine and ibuprofen in the past. Patient is otherwise healthy, has apparently a history of seizures many years ago. She probably does not take any antiepileptics at this time. She denies any trauma, fevers, nasal drainage, sore throat, nausea or vomiting.  Patient is a 26 y.o. female presenting with headaches. The history is provided by the patient.  Headache  Pertinent negatives include no fever, no nausea and no vomiting.    Past Medical History  Diagnosis Date  . 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 last seizure when 19yrs old    History reviewed. No pertinent past surgical history.  History reviewed. No pertinent family history.  History  Substance Use Topics  . Smoking status: Former Smoker -- 0.2 packs/day  for 5 years    Types: Cigarettes    Quit date: 06/23/2012  . Smokeless tobacco: Never Used  . Alcohol Use: No     socially    OB History    Grav Para Term Preterm Abortions TAB SAB Ect Mult Living   2 1 1  0 1 1 0 0 0 1      Review of Systems  Constitutional: Negative for fever and chills.  HENT: Positive for sinus pressure. Negative for congestion, sore throat, rhinorrhea, trouble swallowing and voice change.   Eyes: Negative for photophobia and visual disturbance.  Gastrointestinal: Negative for nausea and vomiting.  Neurological: Positive for headaches.  All other systems reviewed and are negative.    Allergies  Review of patient's allergies indicates no known allergies.  Home Medications   Current Outpatient Rx  Name Route Sig Dispense Refill  . DIPHENHYDRAMINE HCL 25 MG PO CAPS Oral Take 25 mg by mouth every 6 (six) hours as needed. For hives or itching    . EPINEPHRINE 0.3 MG/0.3ML IJ DEVI Intramuscular Inject 0.3 mg into the muscle once. For allergic reaction    . IBUPROFEN 200 MG PO TABS Oral Take 600 mg by mouth every 6 (six) hours as needed. For pain    . PANTOPRAZOLE SODIUM 40 MG PO TBEC Oral Take 40 mg by mouth daily.      BP 133/84  Pulse 77  Temp 98.1 F (36.7 C) (Oral)  Resp 20  SpO2 100%  LMP 07/17/2012  Physical Exam  Nursing note and vitals reviewed. Constitutional:  She is oriented to person, place, and time. She appears well-developed and well-nourished.  HENT:  Head: Normocephalic and atraumatic.  Nose: No mucosal edema or rhinorrhea. Right sinus exhibits maxillary sinus tenderness.  Eyes: EOM are normal. Pupils are equal, round, and reactive to light. No scleral icterus.  Neck: Normal range of motion. Neck supple. No rigidity. Normal range of motion present. No Brudzinski's sign and no Kernig's sign noted.  Cardiovascular: Normal rate.   Pulmonary/Chest: Effort normal. No respiratory distress.  Abdominal: Soft.  Lymphadenopathy:    She has  no cervical adenopathy.  Neurological: She is alert and oriented to person, place, and time. No cranial nerve deficit.  Skin: Skin is warm. No rash noted.    ED Course  Procedures (including critical care time)  Labs Reviewed - No data to display No results found.   1. Headache   2. Sinusitis     Room air saturation is 100% and I interpret this to be normal.   12:31 AM Pt lightly sleepnig, easily rousable, reports HA is much improved.  HA is gone.  Pt is ok going home.    MDM  I reviewed prior epic notes. Patient is not toxic in appearance, smiling appears to be in no apparent distress. My plan is to give her IV medications to treat a suspected migraine headache. An IV of hard he been established by nursing. She'll be given Benadryl, Toradol, Reglan. Otherwise she does not have a fever, purulent nasal drainage, and only mild right maxillary facial tenderness. I think taking over-the-counter decongestants will be appropriate. I will also give her a saline nasal spray for dry sensation that she is complaining of.        Gavin Pound. [place] Fritze, MD 08/26/12 0040

## 2012-08-25 NOTE — ED Notes (Signed)
Pt presents to department for evaluation of R sided headache. Onset Friday afternoon @4 :00pm. Pt states pressure behind R eye. States "I feel like the right side of my head is going to explode." 10/10 pain upon arrival. She is conscious alert and oriented x4. No signs of acute distress noted.

## 2012-08-26 NOTE — Discharge Instructions (Signed)
General Headache Without Cause A headache is pain or discomfort felt around the head or neck area. The specific cause of a headache may not be found. There are many causes and types of headaches. A few common ones are:  Tension headaches.  Migraine headaches.  Cluster headaches.  Chronic daily headaches. HOME CARE INSTRUCTIONS   Keep all follow-up appointments with your caregiver or any specialist referral.  Only take over-the-counter or prescription medicines for pain or discomfort as directed by your caregiver.  Lie down in a dark, quiet room when you have a headache.  Keep a headache journal to find out what may trigger your migraine headaches. For example, write down:  What you eat and drink.  How much sleep you get.  Any change to your diet or medicines.  Try massage or other relaxation techniques.  Put ice packs or heat on the head and neck. Use these 3 to 4 times per day for 15 to 20 minutes each time, or as needed.  Limit stress.  Sit up straight, and do not tense your muscles.  Quit smoking if you smoke.  Limit alcohol use.  Decrease the amount of caffeine you drink, or stop drinking caffeine.  Eat and sleep on a regular schedule.  Get 7 to 9 hours of sleep, or as recommended by your caregiver.  Keep lights dim if bright lights bother you and make your headaches worse. SEEK MEDICAL CARE IF:   You have problems with the medicines you were prescribed.  Your medicines are not working.  You have a change from the usual headache.  You have nausea or vomiting. SEEK IMMEDIATE MEDICAL CARE IF:   Your headache becomes severe.  You have a fever.  You have a stiff neck.  You have loss of vision.  You have muscular weakness or loss of muscle control.  You start losing your balance or have trouble walking.  You feel faint or pass out.  You have severe symptoms that are different from your first symptoms. MAKE SURE YOU:   Understand these  instructions.  Will watch your condition.  Will get help right away if you are not doing well or get worse. Document Released: 10/17/2005 Document Revised: 01/09/2012 Document Reviewed: 11/02/2011 Exit[employer] Patient Information 2013 [employer].  

## 2012-09-04 ENCOUNTER — Emergency Department (HOSPITAL_COMMUNITY)
Admission: EM | Admit: 2012-09-04 | Discharge: 2012-09-04 | Discharge status: Against Medical Advice | Payer: 59 | Attending: Emergency Medicine | Admitting: Emergency Medicine

## 2012-09-04 ENCOUNTER — Encounter (HOSPITAL_COMMUNITY): Payer: Self-pay | Admitting: Physical Medicine and Rehabilitation

## 2012-09-04 DIAGNOSIS — R51 Headache: Secondary | ICD-10-CM | POA: Insufficient documentation

## 2012-09-04 NOTE — ED Notes (Signed)
Pt unable to locate at the time.

## 2012-09-04 NOTE — ED Notes (Signed)
Pt presents to department for evaluation of headache and "pressure" sensation behind nose and eyes. Has been seen recently for same, no relief from pain at home with medications. 9/10 pain upon arrival. Pt is conscious alert and oriented x4. No signs of acute distress noted at the time.

## 2012-09-13 ENCOUNTER — Other Ambulatory Visit: Payer: Self-pay | Admitting: Otolaryngology

## 2013-02-19 ENCOUNTER — Encounter (HOSPITAL_COMMUNITY): Payer: Self-pay

## 2013-02-19 ENCOUNTER — Emergency Department (HOSPITAL_COMMUNITY)
Admission: EM | Admit: 2013-02-19 | Discharge: 2013-02-19 | Disposition: A | Discharge status: Home / Self Care | Payer: 59 | Attending: Emergency Medicine | Admitting: Emergency Medicine

## 2013-02-19 DIAGNOSIS — Z8669 Personal history of other diseases of the nervous system and sense organs: Secondary | ICD-10-CM | POA: Insufficient documentation

## 2013-02-19 DIAGNOSIS — K029 Dental caries, unspecified: Secondary | ICD-10-CM | POA: Insufficient documentation

## 2013-02-19 DIAGNOSIS — R1013 Epigastric pain: Secondary | ICD-10-CM

## 2013-02-19 DIAGNOSIS — Z79899 Other long term (current) drug therapy: Secondary | ICD-10-CM | POA: Insufficient documentation

## 2013-02-19 DIAGNOSIS — K089 Disorder of teeth and supporting structures, unspecified: Secondary | ICD-10-CM | POA: Insufficient documentation

## 2013-02-19 DIAGNOSIS — Z87891 Personal history of nicotine dependence: Secondary | ICD-10-CM | POA: Insufficient documentation

## 2013-02-19 MED ORDER — TRAMADOL HCL 50 MG PO TABS: 50.0000 mg | ORAL_TABLET | Freq: Four times a day (QID) | ORAL | Status: DC | PRN

## 2013-02-19 MED ORDER — SUCRALFATE 1 G PO TABS: 1.0000 g | ORAL_TABLET | Freq: Four times a day (QID) | ORAL | Status: DC

## 2013-02-19 MED ORDER — SUCRALFATE 1 G PO TABS
1.0000 g | ORAL_TABLET | Freq: Once | ORAL | Status: AC
  Administered 2013-02-19: 1 g via ORAL
  Filled 2013-02-19: qty 1

## 2013-02-19 NOTE — ED Notes (Signed)
Taking Ibuprofen for dental pain x 4 days. Presents with constant ache in epigastric area of abdomen. No distention assessed.

## 2013-02-19 NOTE — ED Notes (Signed)
Pt was taking lots of ibuprofen for dental pain and now has abd pain for about one week

## 2013-02-19 NOTE — ED Provider Notes (Signed)
History     CSN: [id number]  Arrival date & time 02/19/13  0500   First MD Initiated Contact with Patient 02/19/13 [phone]      Chief Complaint  Patient presents with  . Abdominal Pain  . Dental Pain    (Consider location/radiation/quality/duration/timing/severity/associated sxs/prior treatment) HPI Comments: This is a 27 year old female, who has been taking ibuprofen on a regular basis for the last several weeks to do, to bilateral lower wisdom tooth.  Cavities, now presenting with epigastric pain.  She has tried over-the-counter Pepto-Bismol without relief.  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 female presenting with abdominal pain and tooth pain. The history is provided by the patient.  Abdominal Pain Pain location:  Epigastric Pain quality: aching   Pain radiates to:  Does not radiate Associated symptoms: no chills, no fever, no nausea and no vomiting   Dental PainPrimary symptoms do not include fever.    Past Medical History  Diagnosis Date  . 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 last seizure when 49yrs old    History reviewed. No pertinent past surgical history.  History reviewed. No pertinent family history.  History  Substance Use Topics  . Smoking status: Former Smoker -- 0.25 packs/day for 5 years    Types: Cigarettes    Quit date: 06/23/2012  . Smokeless tobacco: Never Used  . Alcohol Use: No     Comment: socially    OB History   Grav Para Term Preterm Abortions TAB SAB Ect Mult Living   2 1 1  0 1 1 0 0 0 1      Review of Systems  Constitutional: Negative for fever and chills.  HENT: Positive for dental problem.   Gastrointestinal: Positive for abdominal pain. Negative for nausea and vomiting.  All other systems reviewed and are negative.    Allergies  Peanut-containing drug products  Home Medications   Current Outpatient Rx  Name  Route  Sig  Dispense  Refill  . bismuth  subsalicylate (PEPTO BISMOL) 262 MG chewable tablet   Oral   Chew 524 mg by mouth as needed for indigestion.         Marland Kitchen EPINEPHrine (EPIPEN) 0.3 mg/0.3 mL DEVI   Intramuscular   Inject 0.3 mg into the muscle once. For allergic reaction         . ibuprofen (ADVIL,MOTRIN) 200 MG tablet   Oral   Take 600 mg by mouth every 6 (six) hours as needed. For pain         . pantoprazole (PROTONIX) 40 MG tablet   Oral   Take 40 mg by mouth daily.         . sucralfate (CARAFATE) 1 G tablet   Oral   Take 1 tablet (1 g total) by mouth 4 (four) times daily.   12 tablet   0   . traMADol (ULTRAM) 50 MG tablet   Oral   Take 1 tablet (50 mg total) by mouth every 6 (six) hours as needed for pain.   15 tablet   0     BP 123/76  Pulse 87  Temp(Src) 98.6 F (37 C) (Oral)  Resp 18  SpO2 100%  LMP 02/05/2013  Physical Exam  Constitutional: She appears well-developed and well-nourished.  HENT:  Head: Normocephalic.  Mouth/Throat:    Eyes: Pupils are equal, round, and reactive to light.  Neck: Normal  range of motion.  Cardiovascular: Normal rate.   Pulmonary/Chest: Effort normal.  Abdominal: Soft. She exhibits no distension. There is no tenderness.  Musculoskeletal: Normal range of motion.  Neurological: She is alert.  Skin: Skin is warm.    ED Course  Procedures (including critical care time)  Labs Reviewed - No data to display No results found.   1. Epigastric discomfort   2. Dental cavities       MDM  Will give Carafate for her epigastric discomfort         Arman Filter, NP 02/19/13 2004

## 2013-02-20 NOTE — ED Provider Notes (Signed)
Medical screening examination/treatment/procedure(s) were performed by non-physician practitioner and as supervising physician I was immediately available for consultation/collaboration.  Nikaela Coyne, MD 02/20/13 0614 

## 2013-07-28 ENCOUNTER — Encounter (HOSPITAL_COMMUNITY): Payer: Self-pay | Admitting: Emergency Medicine

## 2013-07-28 ENCOUNTER — Emergency Department (HOSPITAL_COMMUNITY)
Admission: EM | Admit: 2013-07-28 | Discharge: 2013-07-28 | Disposition: A | Discharge status: Home / Self Care | Payer: 59 | Attending: Emergency Medicine | Admitting: Emergency Medicine

## 2013-07-28 DIAGNOSIS — K047 Periapical abscess without sinus: Secondary | ICD-10-CM

## 2013-07-28 DIAGNOSIS — K0889 Other specified disorders of teeth and supporting structures: Secondary | ICD-10-CM

## 2013-07-28 DIAGNOSIS — Z8669 Personal history of other diseases of the nervous system and sense organs: Secondary | ICD-10-CM | POA: Insufficient documentation

## 2013-07-28 DIAGNOSIS — Z87891 Personal history of nicotine dependence: Secondary | ICD-10-CM | POA: Insufficient documentation

## 2013-07-28 DIAGNOSIS — R110 Nausea: Secondary | ICD-10-CM | POA: Insufficient documentation

## 2013-07-28 DIAGNOSIS — K044 Acute apical periodontitis of pulpal origin: Secondary | ICD-10-CM | POA: Insufficient documentation

## 2013-07-28 MED ORDER — AMOXICILLIN 500 MG PO CAPS: 500.0000 mg | ORAL_CAPSULE | Freq: Three times a day (TID) | ORAL | Status: DC

## 2013-07-28 NOTE — ED Notes (Signed)
Pt lt tooth pain for 2 weeks now. Seen pcp given norco and thinks that it is infected and did not get antibiotics.

## 2013-07-28 NOTE — ED Provider Notes (Signed)
CSN: [id number]     Arrival date & time 07/28/13  1237 History  This chart was scribed for non-physician practitioner, Johnnette Gourd, PA-C working with Flint Melter, MD by Greggory Stallion, ED scribe. This patient was seen in room WTR5/WTR5 and the patient's care was started at 12:47 PM.   Chief Complaint  Patient presents with  . Dental Pain   The history is provided by the patient. No language interpreter was used.   HPI Comments: Kathy Bird is a 27 y.o. female who presents to the Emergency Department complaining of gradual onset, constant left lower dental pain that radiates into her head that started 2 weeks ago. Chewing worsens the pain. Pt went to her PCP and was given Norco and she states it makes her nauseous. She has tried ibuprofen with no relief. Pt denies difficulty swallowing, fever and chills. She does not have a Education officer, community.   Past Medical History  Diagnosis Date  . 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 last seizure when 39yrs old   No past surgical history on file. No family history on file. History  Substance Use Topics  . Smoking status: Former Smoker -- 0.25 packs/day for 5 years    Types: Cigarettes    Quit date: 06/23/2012  . Smokeless tobacco: Never Used  . Alcohol Use: No     Comment: socially   OB History   Grav Para Term Preterm Abortions TAB SAB Ect Mult Living   2 1 1  0 1 1 0 0 0 1     Review of Systems  Constitutional: Negative for fever and chills.  HENT: Positive for dental problem. Negative for trouble swallowing.   All other systems reviewed and are negative.    Allergies  Peanut-containing drug products  Home Medications   Current Outpatient Rx  Name  Route  Sig  Dispense  Refill  . bismuth subsalicylate (PEPTO BISMOL) 262 MG chewable tablet   Oral   Chew 524 mg by mouth as needed for indigestion.         Marland Kitchen EPINEPHrine (EPIPEN) 0.3 mg/0.3 mL DEVI   Intramuscular   Inject 0.3 mg into the muscle once. For allergic reaction         . ibuprofen  (ADVIL,MOTRIN) 200 MG tablet   Oral   Take 600 mg by mouth every 6 (six) hours as needed. For pain         . pantoprazole (PROTONIX) 40 MG tablet   Oral   Take 40 mg by mouth daily.         . sucralfate (CARAFATE) 1 G tablet   Oral   Take 1 tablet (1 g total) by mouth 4 (four) times daily.   12 tablet   0   . traMADol (ULTRAM) 50 MG tablet   Oral   Take 1 tablet (50 mg total) by mouth every 6 (six) hours as needed for pain.   15 tablet   0    BP 117/62  Pulse 88  Temp(Src) 98.2 F (36.8 C) (Oral)  Resp 20  SpO2 99%  Physical Exam  Nursing note and vitals reviewed. Constitutional: She is oriented to person, place, and time. She appears well-developed and well-nourished. No distress.  HENT:  Head: Normocephalic and atraumatic.  Mouth/Throat: Oropharynx is clear and moist.  Left third molar has marked tooth decay with mild surrounding erythema and edema. No abscess. Tender to palpation.   Eyes: Conjunctivae and EOM are normal.  Neck: Normal range of  motion. Neck supple.  Left submandibular adenopathy.   Cardiovascular: Normal rate, regular rhythm and normal heart sounds.   Pulmonary/Chest: Effort normal and breath sounds normal. No respiratory distress.  Musculoskeletal: Normal range of motion. She exhibits no edema.  Neurological: She is alert and oriented to person, place, and time. No sensory deficit.  Skin: Skin is warm and dry.  Psychiatric: She has a normal mood and affect. Her behavior is normal.    ED Course  Procedures (including critical care time)  DIAGNOSTIC STUDIES: Oxygen Saturation is 99% on RA, normal by my interpretation.    COORDINATION OF CARE: 12:50 PM-Discussed treatment plan which includes antibiotic and continuing pain medication at home with pt at bedside and pt agreed to plan. Advised pt to follow up with a dentist.   Labs Review Labs Reviewed - No data to display Imaging Review No results found.  MDM   1. Dental infection   2.  Pain, dental     Dental pain associated with dental infection. No evidence of dental abscess. Patient is afebrile, non toxic appearing and swallowing secretions well. I gave patient referral to dentist and stressed the importance of dental follow up for ultimate management of dental pain. Rx for amoxicillin. She has norco at home. Patient voices understanding and is agreeable to plan.   I personally performed the services described in this documentation, which was scribed in my presence. The recorded information has been reviewed and is accurate.   Trevor Mace, PA-C 07/28/13 1256

## 2013-07-28 NOTE — ED Provider Notes (Signed)
Medical screening examination/treatment/procedure(s) were performed by non-physician practitioner and as supervising physician I was immediately available for consultation/collaboration.  Flint Melter, MD 07/28/13 1556

## 2013-07-28 NOTE — ED Notes (Signed)
Pt escorted to discharge window. Pt verbalized understanding discharge instructions. In no acute distress.  

## 2013-11-11 ENCOUNTER — Encounter (HOSPITAL_BASED_OUTPATIENT_CLINIC_OR_DEPARTMENT_OTHER): Payer: Self-pay | Admitting: Emergency Medicine

## 2013-11-11 ENCOUNTER — Emergency Department (HOSPITAL_BASED_OUTPATIENT_CLINIC_OR_DEPARTMENT_OTHER)
Admission: EM | Admit: 2013-11-11 | Discharge: 2013-11-11 | Disposition: A | Discharge status: Home / Self Care | Payer: 59 | Attending: Emergency Medicine | Admitting: Emergency Medicine

## 2013-11-11 DIAGNOSIS — Z8669 Personal history of other diseases of the nervous system and sense organs: Secondary | ICD-10-CM | POA: Insufficient documentation

## 2013-11-11 DIAGNOSIS — Z79899 Other long term (current) drug therapy: Secondary | ICD-10-CM | POA: Insufficient documentation

## 2013-11-11 DIAGNOSIS — F17200 Nicotine dependence, unspecified, uncomplicated: Secondary | ICD-10-CM | POA: Insufficient documentation

## 2013-11-11 DIAGNOSIS — J029 Acute pharyngitis, unspecified: Secondary | ICD-10-CM | POA: Insufficient documentation

## 2013-11-11 DIAGNOSIS — K120 Recurrent oral aphthae: Secondary | ICD-10-CM | POA: Insufficient documentation

## 2013-11-11 DIAGNOSIS — Z792 Long term (current) use of antibiotics: Secondary | ICD-10-CM | POA: Insufficient documentation

## 2013-11-11 MED ORDER — MAGIC MOUTHWASH W/LIDOCAINE: 5.0000 mL | Freq: Four times a day (QID) | ORAL | Status: DC | PRN

## 2013-11-11 MED ORDER — DEXAMETHASONE SODIUM PHOSPHATE 10 MG/ML IJ SOLN
10.0000 mg | Freq: Once | INTRAMUSCULAR | Status: AC
  Administered 2013-11-11: 10 mg via INTRAMUSCULAR
  Filled 2013-11-11: qty 1

## 2013-11-11 NOTE — Discharge Instructions (Signed)
Oral Ulcers °Oral ulcers are painful, shallow sores around the lining of the mouth. They can affect the gums, the inside of the lips and the cheeks (sores on the outside of the lips and on the face are different). They typically first occur in school aged children and teenagers. Oral ulcers may also be called canker sores or cold sores. °CAUSES  °Canker sores and cold sores can be caused by many factors including: °· Infection. °· Injury. °· Sun exposure. °· Medications. °· Emotional stress. °· Food allergies. °· Vitamin deficiencies. °· Toothpastes containing sodium lauryl sulfate. °The Herpes Virus can be the cause of mouth ulcers. The first infection can be severe and cause 10 or more ulcers on the gums, tongue and lips with fever and difficulty in swallowing. This infection usually occurs between the ages of 1 and 3 years.  °SYMPTOMS  °The typical sore is about ¼ inch (6 mm) in size, is an oval or round ulcer with red borders. °DIAGNOSIS  °Your caregiver can diagnose simple oral ulcers by examination. Additional testing is usually not required.  °TREATMENT  °Treatment is aimed at pain relief. Generally, oral ulcers resolve by themselves within 1 to 2 weeks without medication and are not contagious unless caused by Herpes (and other viruses). Antibiotics are not effective with mouth sores. Avoid direct contact with others until the ulcer is completely healed. See your caregiver for follow-up care as recommended. Also: °· Offer a soft diet. °· Encourage plenty of fluids to prevent dehydration. Popsicles and milk shakes can be helpful. °· Avoid acidic and salty foods and drinks such as orange juice. °· Infants and young children will often refuse to drink because of pain. Using a teaspoon, cup or syringe to give small amounts of fluids frequently can help prevent dehydration. °· Cold compresses on the face may help reduce pain. °· Pain medication can help control soreness. °· A solution of diphenhydramine mixed  with a liquid antacid can be useful to decrease the soreness of ulcers. Consult a caregiver for the dosing. °· Liquids or ointments with a numbing ingredient may be helpful when used as recommended. °· Older children and teenagers can rinse their mouth with a salt-water mixture (1/2 teaspoonof salt in 8 ounces of water) four times a day. This treatment is uncomfortable but may reduce the time the ulcers are present. °· There are many over the counter throat lozenges and medications available for oral ulcers. There effectiveness has not been studied. °· Consult your medical caregiver prior to using homeopathic treatments for oral ulcers. °SEEK MEDICAL CARE IF:  °· You think your child needs to be seen. °· The pain worsens and you cannot control it. °· There are 4 or more ulcers. °· The lips and gums begin to bleed and crust. °· A single mouth ulcer is near a tooth that is causing a toothache or pain. °· Your child has a fever, swollen face, or swollen glands. °· The ulcers began after starting a medication. °· Mouth ulcers keep re-occurring or last more than 2 weeks. °· You think your child is not taking adequate fluids. °SEEK IMMEDIATE MEDICAL CARE IF:  °· Your child has a high fever. °· Your child is unable to swallow or becomes dehydrated. °· Your child looks or acts very ill. °· An ulcer caused by a chemical your child accidentally put in their mouth. °Document Released: 11/24/2004 Document Revised: 01/09/2012 Document Reviewed: 07/09/2009 °[employer]® Patient Information ©2014 [employer]. ° °

## 2013-11-11 NOTE — ED Notes (Signed)
Pt c/o ate scallops yesterday and had tongue swelling , pt c/o today of mouth tenderness

## 2013-11-11 NOTE — ED Notes (Signed)
PA at bedside.

## 2013-11-11 NOTE — ED Provider Notes (Signed)
CSN: [id number]     Arrival date & time 11/11/13  1742 History   First MD Initiated Contact with Patient 11/11/13 1803     Chief Complaint  Patient presents with  . mouth pain    (Consider location/radiation/quality/duration/timing/severity/associated sxs/prior Treatment) HPI Comments: Patient is otherwise healthy 28 year old female who presents to the ED after eating scallops yesterday.  She states that she has the sensation that her tongue was swelling and she took some benadryl which has not helped.  She reports now with several painful lesions on her tongue and mouth soreness.  She denies fever, chills, throat swelling, chest pain, shortness of breath, nausea or vomiting, or dental pain.  Patient is a 28 y.o. female presenting with mouth sores. The history is provided by the patient. No language interpreter was used.  Mouth Lesions Location:  Tongue Quality:  Ulcerous and painful Pain details:    Quality:  Sharp, shooting and sore   Severity:  Severe   Duration:  24 hours   Timing:  Constant   Progression:  Worsening Onset quality:  Sudden Severity:  Moderate Duration:  24 hours Progression:  Worsening Chronicity:  New Context: change in diet   Context: not a change in medications, not medications, not a possible infection, not stress and not trauma   Relieved by:  Nothing Worsened by:  Nothing tried Ineffective treatments:  Topical solutions Associated symptoms: sore throat   Associated symptoms: no congestion, no dental pain, no ear pain, no fever, no malaise, no neck pain, no rash, no rhinorrhea and no swollen glands     Past Medical History  Diagnosis Date  . 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 last seizure when 70yrs old   History reviewed. No pertinent past surgical history. History reviewed. No pertinent family history. History  Substance Use Topics  . Smoking status: Current Every Day Smoker -- 0.25 packs/day for 5 years    Types: Cigarettes    Last Attempt to Quit:  06/23/2012  . Smokeless tobacco: Never Used  . Alcohol Use: No     Comment: socially   OB History   Grav Para Term Preterm Abortions TAB SAB Ect Mult Living   2 1 1  0 1 1 0 0 0 1     Review of Systems  Constitutional: Negative for fever.  HENT: Positive for mouth sores and sore throat. Negative for congestion, ear pain and rhinorrhea.   Musculoskeletal: Negative for neck pain.  Skin: Negative for rash.  All other systems reviewed and are negative.    Allergies  Peanut-containing drug products  Home Medications   Current Outpatient Rx  Name  Route  Sig  Dispense  Refill  . diphenhydrAMINE (BENADRYL) 25 MG tablet   Oral   Take 50 mg by mouth every 6 (six) hours as needed.         Marland Kitchen ibuprofen (ADVIL,MOTRIN) 200 MG tablet   Oral   Take 600 mg by mouth every 6 (six) hours as needed. For pain         . amoxicillin (AMOXIL) 500 MG capsule   Oral   Take 1 capsule (500 mg total) by mouth 3 (three) times daily.   21 capsule   0   . bismuth subsalicylate (PEPTO BISMOL) 262 MG chewable tablet   Oral   Chew 524 mg by mouth as needed for indigestion.         Marland Kitchen EPINEPHrine (EPIPEN) 0.3 mg/0.3 mL DEVI   Intramuscular  Inject 0.3 mg into the muscle once. For allergic reaction         . pantoprazole (PROTONIX) 40 MG tablet   Oral   Take 40 mg by mouth daily.         . sucralfate (CARAFATE) 1 G tablet   Oral   Take 1 tablet (1 g total) by mouth 4 (four) times daily.   12 tablet   0   . traMADol (ULTRAM) 50 MG tablet   Oral   Take 1 tablet (50 mg total) by mouth every 6 (six) hours as needed for pain.   15 tablet   0    BP 118/75  Pulse 107  Temp(Src) 99.1 F (37.3 C)  Resp 16  Ht 5\' 4"  (1.626 m)  Wt 130 lb (58.968 kg)  BMI 22.30 kg/m2  SpO2 100%  LMP 10/13/2013 Physical Exam  Nursing note and vitals reviewed. Constitutional: She is oriented to person, place, and time. She appears well-developed and well-nourished. No distress.  HENT:  Head:  Atraumatic.  Right Ear: External ear normal.  Left Ear: External ear normal.  Nose: Nose normal.  Mouth/Throat: Oropharynx is clear and moist and mucous membranes are normal. Oral lesions present. No trismus in the jaw. No oropharyngeal exudate.    Multiple apthous ulcers  Eyes: Conjunctivae are normal. Pupils are equal, round, and reactive to light. No scleral icterus.  Neck: Normal range of motion. Neck supple.  Pulmonary/Chest: Effort normal.  Musculoskeletal: Normal range of motion. She exhibits no edema and no tenderness.  Lymphadenopathy:    She has no cervical adenopathy.  Neurological: She is alert and oriented to person, place, and time. She exhibits normal muscle tone. Coordination normal.  Skin: Skin is warm and dry. No rash noted. No erythema. No pallor.  Psychiatric: She has a normal mood and affect. Her behavior is normal. Judgment and thought content normal.    ED Course  Procedures (including critical care time) Labs Review Labs Reviewed - No data to display Imaging Review No results found.  EKG Interpretation   None      Medications  dexamethasone (DECADRON) injection 10 mg (10 mg Intramuscular Given 11/11/13 1820)    MDM  Apthous ulcers   Patient here with mouth ulcers after eating scallops.  No evidence to suggest ludwigs angina, PTA, dental abscess.   Izola PriceFrances C. Marisue HumbleSanford, PA-C 11/11/13 1851

## 2013-11-12 NOTE — ED Provider Notes (Signed)
Medical screening examination/treatment/procedure(s) were performed by non-physician practitioner and as supervising physician I was immediately available for consultation/collaboration.  EKG Interpretation   None         Audree CamelScott T Marilyn Wing, MD 11/12/13 [id number]

## 2013-11-14 ENCOUNTER — Encounter (HOSPITAL_COMMUNITY): Payer: Self-pay | Admitting: Emergency Medicine

## 2013-11-14 ENCOUNTER — Emergency Department (HOSPITAL_COMMUNITY)
Admission: EM | Admit: 2013-11-14 | Discharge: 2013-11-15 | Disposition: A | Discharge status: Home / Self Care | Payer: 59 | Attending: Emergency Medicine | Admitting: Emergency Medicine

## 2013-11-14 DIAGNOSIS — L509 Urticaria, unspecified: Secondary | ICD-10-CM | POA: Insufficient documentation

## 2013-11-14 DIAGNOSIS — L988 Other specified disorders of the skin and subcutaneous tissue: Secondary | ICD-10-CM | POA: Insufficient documentation

## 2013-11-14 DIAGNOSIS — Y9389 Activity, other specified: Secondary | ICD-10-CM | POA: Insufficient documentation

## 2013-11-14 DIAGNOSIS — T61771A Other fish poisoning, accidental (unintentional), initial encounter: Secondary | ICD-10-CM | POA: Insufficient documentation

## 2013-11-14 DIAGNOSIS — Y929 Unspecified place or not applicable: Secondary | ICD-10-CM | POA: Insufficient documentation

## 2013-11-14 DIAGNOSIS — R1310 Dysphagia, unspecified: Secondary | ICD-10-CM | POA: Insufficient documentation

## 2013-11-14 DIAGNOSIS — T7840XA Allergy, unspecified, initial encounter: Secondary | ICD-10-CM

## 2013-11-14 DIAGNOSIS — F17200 Nicotine dependence, unspecified, uncomplicated: Secondary | ICD-10-CM | POA: Insufficient documentation

## 2013-11-14 DIAGNOSIS — IMO0002 Reserved for concepts with insufficient information to code with codable children: Secondary | ICD-10-CM

## 2013-11-14 DIAGNOSIS — Z8669 Personal history of other diseases of the nervous system and sense organs: Secondary | ICD-10-CM | POA: Insufficient documentation

## 2013-11-14 MED ORDER — DIPHENHYDRAMINE HCL 50 MG/ML IJ SOLN
25.0000 mg | Freq: Once | INTRAMUSCULAR | Status: AC
  Administered 2013-11-14: 25 mg via INTRAVENOUS
  Filled 2013-11-14: qty 1

## 2013-11-14 MED ORDER — SODIUM CHLORIDE 0.9 % IV BOLUS (SEPSIS)
1000.0000 mL | Freq: Once | INTRAVENOUS | Status: AC
  Administered 2013-11-14: 1000 mL via INTRAVENOUS

## 2013-11-14 MED ORDER — FAMOTIDINE IN NACL 20-0.9 MG/50ML-% IV SOLN
20.0000 mg | Freq: Once | INTRAVENOUS | Status: AC
  Administered 2013-11-14: 20 mg via INTRAVENOUS
  Filled 2013-11-14: qty 50

## 2013-11-14 MED ORDER — METHYLPREDNISOLONE SODIUM SUCC 125 MG IJ SOLR
125.0000 mg | Freq: Once | INTRAMUSCULAR | Status: AC
  Administered 2013-11-14: 125 mg via INTRAVENOUS
  Filled 2013-11-14: qty 2

## 2013-11-14 NOTE — ED Notes (Signed)
Pt presents with hives and swelling under left eye, pt reports hives inside mouth and on lips; pt has visibly swollen lips; pt unable to part lips and unable to speak above a muffle sound; pt states she has hives on back as well; pt states while lying in bed last evening she felt tightness in throat; pt unable to eat; Pain level stated at level 10.

## 2013-11-14 NOTE — ED Notes (Signed)
Pt ate shrimp on Sunday and immediately after began to have hives and tongue felt swollen and she began to notice white patches on it. She went to [place] and was given a steroid shot and mouthwash but she reports her symptoms are not resolving. Pt states it feels hard to swallow but she can breathe easily. She has been taking benadryl daily, last dose 11pm last night

## 2013-11-14 NOTE — ED Provider Notes (Signed)
CSN: [id number]     Arrival date & time 11/14/13  1419 History   First MD Initiated Contact with Patient 11/14/13 2208     Chief Complaint  Patient presents with  . Allergic Reaction   (Consider location/radiation/quality/duration/timing/severity/associated sxs/prior Treatment) HPI Comments: Patient is a 28 year old who presents with allergic reaction that started 4 days ago after she ate shrimp and scallops. Symptoms started gradually and progressively worsened since the onset. Patient reports tongue and throat swelling. Patient went to [facility] after the ingestions and was given a steroid shot and magic mouthwash. Patient has been taking benadryl daily since the ingestion which have not been helping. Patient has developed hives since the ED visit and leukoplakia. No aggravating/alleviating factors. No other associated symptoms.   Patient is a 28 y.o. female presenting with allergic reaction.  Allergic Reaction Presenting symptoms: difficulty swallowing     Past Medical History  Diagnosis Date  . 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 last seizure when 33yrs old   History reviewed. No pertinent past surgical history. History reviewed. No pertinent family history. History  Substance Use Topics  . Smoking status: Current Every Day Smoker -- 0.25 packs/day for 5 years    Types: Cigarettes    Last Attempt to Quit: 06/23/2012  . Smokeless tobacco: Never Used  . Alcohol Use: No     Comment: socially   OB History   Grav Para Term Preterm Abortions TAB SAB Ect Mult Living   2 1 1  0 1 1 0 0 0 1     Review of Systems  Constitutional: Negative for fever, chills and fatigue.  HENT: Positive for trouble swallowing.   Eyes: Negative for visual disturbance.  Respiratory: Negative for shortness of breath.   Cardiovascular: Negative for chest pain and palpitations.  Gastrointestinal: Negative for nausea, vomiting, abdominal pain and diarrhea.  Genitourinary: Negative for dysuria and  difficulty urinating.  Musculoskeletal: Negative for arthralgias and neck pain.  Skin: Negative for color change.  Neurological: Negative for dizziness and weakness.  Psychiatric/Behavioral: Negative for dysphoric mood.    Allergies  Peanut-containing drug products and Shellfish allergy  Home Medications   Current Outpatient Rx  Name  Route  Sig  Dispense  Refill  . Alum & Mag Hydroxide-Simeth (MAGIC MOUTHWASH W/LIDOCAINE) SOLN   Oral   Take 5 mLs by mouth 4 (four) times daily as needed for mouth pain.   120 mL   0   . diphenhydrAMINE (BENADRYL) 25 MG tablet   Oral   Take 50 mg by mouth every 6 (six) hours as needed for allergies.          Marland Kitchen ibuprofen (ADVIL,MOTRIN) 200 MG tablet   Oral   Take 800 mg by mouth every 6 (six) hours as needed for moderate pain. For pain         . Multiple Vitamin (MULTIVITAMIN WITH MINERALS) TABS tablet   Oral   Take 1 tablet by mouth daily.          BP 126/76  Pulse 105  Temp(Src) 98.5 F (36.9 C) (Oral)  Resp 20  SpO2 100%  LMP 10/13/2013 Physical Exam  Nursing note and vitals reviewed. Constitutional: She is oriented to person, place, and time. She appears well-developed and well-nourished. No distress.  HENT:  Head: Normocephalic and atraumatic.  Leukoplakia of tongue.   Eyes: Conjunctivae and EOM are normal.  Neck: Normal range of motion.  Cardiovascular: Normal rate and regular rhythm.  Exam  reveals no gallop and no friction rub.   No murmur heard. Pulmonary/Chest: Effort normal and breath sounds normal. She has no wheezes. She has no rales. She exhibits no tenderness.  Abdominal: Soft. She exhibits no distension. There is no tenderness. There is no rebound and no guarding.  Musculoskeletal: Normal range of motion.  Neurological: She is alert and oriented to person, place, and time. Coordination normal.  Speech is goal-oriented. Moves limbs without ataxia.   Skin: Skin is warm and dry.  Scattered urticarial lesions over  generalized body.   Psychiatric: She has a normal mood and affect. Her behavior is normal.    ED Course  Procedures (including critical care time) Labs Review Labs Reviewed - No data to display Imaging Review No results found.  EKG Interpretation   None       MDM   1. Allergic reaction   2. Leukoplakia     10:59 PM Patient will have IV solumedrol, benadryl and pepcid. Patient is slightly tachycardic with remaining vitals stable. Patient denies SOB or throat closing.   12:09 AM Patient feeling better. Patient will be discharged with prednisone taper, tramadol for pain, and nystatin for oral leukoplakia. Patient advised to return with worsening or concerning symptoms. Vitals stable and patient afebrile.   Emilia BeckKaitlyn Davidmichael Zarazua, PA-C 11/15/13 [phone]

## 2013-11-15 MED ORDER — PREDNISONE 20 MG PO TABS: 40.0000 mg | ORAL_TABLET | Freq: Every day | ORAL | Status: DC

## 2013-11-15 MED ORDER — NYSTATIN 100000 UNIT/ML MT SUSP: 500000.0000 [IU] | Freq: Four times a day (QID) | OROMUCOSAL | Status: DC

## 2013-11-15 MED ORDER — TRAMADOL HCL 50 MG PO TABS: 50.0000 mg | ORAL_TABLET | Freq: Four times a day (QID) | ORAL | Status: DC | PRN

## 2013-11-15 NOTE — Discharge Instructions (Signed)
Take prednisone and nystatin as directed until gone. Take Tramadol as needed for pain. Refer to attached documents for more information. Return to the ED with worsening or concerning symptoms.

## 2013-11-15 NOTE — ED Provider Notes (Signed)
Medical screening examination/treatment/procedure(s) were performed by non-physician practitioner and as supervising physician I was immediately available for consultation/collaboration.  EKG Interpretation   None      ]   Dagmar HaitWilliam Deveon Kisiel, MD 11/15/13 [phone]

## 2014-05-18 ENCOUNTER — Encounter (HOSPITAL_COMMUNITY): Payer: Self-pay | Admitting: Emergency Medicine

## 2014-05-18 ENCOUNTER — Emergency Department (HOSPITAL_COMMUNITY)
Admission: EM | Admit: 2014-05-18 | Discharge: 2014-05-18 | Disposition: A | Discharge status: Home / Self Care | Payer: Medicaid - Out of State | Attending: Emergency Medicine | Admitting: Emergency Medicine

## 2014-05-18 DIAGNOSIS — R51 Headache: Secondary | ICD-10-CM | POA: Diagnosis present

## 2014-05-18 DIAGNOSIS — H53149 Visual discomfort, unspecified: Secondary | ICD-10-CM | POA: Insufficient documentation

## 2014-05-18 DIAGNOSIS — Z79899 Other long term (current) drug therapy: Secondary | ICD-10-CM | POA: Diagnosis not present

## 2014-05-18 DIAGNOSIS — F17200 Nicotine dependence, unspecified, uncomplicated: Secondary | ICD-10-CM | POA: Diagnosis not present

## 2014-05-18 DIAGNOSIS — J019 Acute sinusitis, unspecified: Secondary | ICD-10-CM | POA: Diagnosis not present

## 2014-05-18 DIAGNOSIS — R519 Headache, unspecified: Secondary | ICD-10-CM

## 2014-05-18 DIAGNOSIS — IMO0002 Reserved for concepts with insufficient information to code with codable children: Secondary | ICD-10-CM | POA: Diagnosis not present

## 2014-05-18 DIAGNOSIS — R52 Pain, unspecified: Secondary | ICD-10-CM | POA: Diagnosis not present

## 2014-05-18 MED ORDER — OXYMETAZOLINE HCL 0.05 % NA SOLN: 1.0000 | Freq: Two times a day (BID) | NASAL | Status: DC

## 2014-05-18 MED ORDER — SODIUM CHLORIDE 0.9 % IV BOLUS (SEPSIS)
500.0000 mL | Freq: Once | INTRAVENOUS | Status: AC
  Administered 2014-05-18: 500 mL via INTRAVENOUS

## 2014-05-18 MED ORDER — METOCLOPRAMIDE HCL 5 MG/ML IJ SOLN
10.0000 mg | Freq: Once | INTRAMUSCULAR | Status: AC
  Administered 2014-05-18: 10 mg via INTRAVENOUS
  Filled 2014-05-18: qty 2

## 2014-05-18 MED ORDER — DIPHENHYDRAMINE HCL 50 MG/ML IJ SOLN
25.0000 mg | Freq: Once | INTRAMUSCULAR | Status: AC
  Administered 2014-05-18: 25 mg via INTRAVENOUS
  Filled 2014-05-18: qty 1

## 2014-05-18 NOTE — ED Provider Notes (Signed)
CSN: [id number]     Arrival date & time 05/18/14  1021 History   First MD Initiated Contact with Patient 05/18/14 1055     Chief Complaint  Patient presents with  . Headache     (Consider location/radiation/quality/duration/timing/severity/associated sxs/prior Treatment) HPI Comments: Patient presents with complaint of four-day history of left headache, sinus pressure and facial pain. She has had revision in her left eye no vision loss. Pain began gradually and has been constant. She has taken many tablets of 200 mg ibuprofen in the past 2 days. It has not helped. She has photophobia and nasal congestion. No fever. No neck pain or trouble moving her neck. She denies head injury or trauma. No weakness, tingling, numbness in her arms or legs. Patient denies signs of stroke including: facial droop, slurred speech, aphasia, imbalance/trouble walking. The onset of this condition was acute. The course is constant. Aggravating factors: none. Alleviating factors: none.     Patient is a 28 y.o. female presenting with headaches. The history is provided by the patient.  Headache Associated symptoms: congestion and sinus pressure   Associated symptoms: no fever, no nausea, no neck pain, no neck stiffness, no numbness, no photophobia and no vomiting     Past Medical History  Diagnosis Date  . 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 last seizure when 89yrs old   Past Surgical History  Procedure Laterality Date  . Adenoidectomy     History reviewed. No pertinent family history. History  Substance Use Topics  . Smoking status: Current Every Day Smoker -- 5 years    Types: Cigarettes  . Smokeless tobacco: Never Used  . Alcohol Use: No     Comment: socially   OB History   Grav Para Term Preterm Abortions TAB SAB Ect Mult Living   2 1 1  0 1 1 0 0 0 1     Review of Systems  Constitutional: Negative for fever.  HENT: Positive for congestion and sinus pressure. Negative for dental problem and rhinorrhea.    Eyes: Negative for photophobia, discharge, redness and visual disturbance.  Respiratory: Negative for shortness of breath.   Cardiovascular: Negative for chest pain.  Gastrointestinal: Negative for nausea and vomiting.  Musculoskeletal: Negative for gait problem, neck pain and neck stiffness.  Skin: Negative for rash.  Neurological: Positive for headaches. Negative for syncope, speech difficulty, weakness, light-headedness and numbness.  Psychiatric/Behavioral: Negative for confusion.    Allergies  Peanut-containing drug products and Shellfish allergy  Home Medications   Prior to Admission medications   Medication Sig Start Date End Date Taking? Authorizing Provider  Alum & Mag Hydroxide-Simeth (MAGIC MOUTHWASH W/LIDOCAINE) SOLN Take 5 mLs by mouth 4 (four) times daily as needed for mouth pain. 11/11/13   Izola PriceFrances C. Sanford, PA-C  diphenhydrAMINE (BENADRYL) 25 MG tablet Take 50 mg by mouth every 6 (six) hours as needed for allergies.     Historical Provider, MD  ibuprofen (ADVIL,MOTRIN) 200 MG tablet Take 800 mg by mouth every 6 (six) hours as needed for moderate pain. For pain    Historical Provider, MD  Multiple Vitamin (MULTIVITAMIN WITH MINERALS) TABS tablet Take 1 tablet by mouth daily.    Historical Provider, MD  nystatin (MYCOSTATIN) 100000 UNIT/ML suspension Take 5 mLs (500,000 Units total) by mouth 4 (four) times daily. 11/15/13   Kaitlyn Szekalski, PA-C  predniSONE (DELTASONE) 20 MG tablet Take 2 tablets (40 mg total) by mouth daily. Take 40 mg by mouth daily for 3 days, then 20mg  by  mouth daily for 3 days, then 10mg  daily for 3 days 11/15/13   Emilia Beck, PA-C  traMADol (ULTRAM) 50 MG tablet Take 1 tablet (50 mg total) by mouth every 6 (six) hours as needed. 11/15/13   Kaitlyn Szekalski, PA-C   BP 114/70  Pulse 95  Temp(Src) 98.3 F (36.8 C) (Oral)  Resp 16  SpO2 100%  LMP 04/23/2014  Physical Exam  Nursing note and vitals reviewed. Constitutional: She is oriented  to person, place, and time. She appears well-developed and well-nourished.  HENT:  Head: Normocephalic and atraumatic.  Right Ear: Tympanic membrane, external ear and ear canal normal.  Left Ear: Tympanic membrane, external ear and ear canal normal.  Nose: No mucosal edema or rhinorrhea. Right sinus exhibits no maxillary sinus tenderness and no frontal sinus tenderness. Left sinus exhibits maxillary sinus tenderness and frontal sinus tenderness.  Mouth/Throat: Uvula is midline, oropharynx is clear and moist and mucous membranes are normal.  Eyes: Conjunctivae, EOM and lids are normal. Pupils are equal, round, and reactive to light. Right eye exhibits no nystagmus. Left eye exhibits no nystagmus.  Neck: Normal range of motion. Neck supple.  No meningismus.   Cardiovascular: Normal rate and regular rhythm.   Pulmonary/Chest: Effort normal and breath sounds normal.  Abdominal: Soft. There is no tenderness.  Musculoskeletal:       Cervical back: She exhibits normal range of motion, no tenderness and no bony tenderness.  Neurological: She is alert and oriented to person, place, and time. She has normal strength and normal reflexes. No cranial nerve deficit or sensory deficit. She displays a negative Romberg sign. Coordination and gait normal. GCS eye subscore is 4. GCS verbal subscore is 5. GCS motor subscore is 6.  Skin: Skin is warm and dry.  Psychiatric: She has a normal mood and affect.    ED Course  Procedures (including critical care time) Labs Review Labs Reviewed - No data to display  Imaging Review No results found.   EKG Interpretation None      11:04 AM Patient seen and examined. Work-up initiated. Medications ordered.   Vital signs reviewed and are as follows: Filed Vitals:   05/18/14 1048  BP: 114/70  Pulse: 95  Temp: 98.3 F (36.8 C)  Resp: 16   12:12 PM HA resolved. Patient requests to go home.   Will give Afrin for sinusitis. She can use over-the-counter  decongestants if she so chooses. Encouraged patient to rest and hydrate well for the remainder of today. Return with worsening symptoms, severe headache, persistent vomiting, confusion. Patient verbalizes understanding and agrees with plan.    MDM   Final diagnoses:  Acute nonintractable headache, unspecified headache type  Acute sinusitis, recurrence not specified, unspecified location   Patient without high-risk features of headache including: sudden onset/thunderclap HA, no similar headache in past, altered mental status, accompanying seizure, headache with exertion, age > 98, history of immunocompromise, neck or shoulder pain, fever, use of anticoagulation, family history of spontaneous SAH, concomitant drug use, toxic exposure.   Patient has a normal complete neurological exam, normal vital signs, normal level of consciousness, no signs of meningismus, is well-appearing/non-toxic appearing, no signs of trauma, no pain over the temporal arteries.   Suspect headache related to sinusitis as patient has L sinus pressure on exam.   Imaging with CT/MRI not indicated given history and physical exam findings.   No dangerous or life-threatening conditions suspected or identified by history, physical exam, and by work-up. No indications for hospitalization identified.  Mildly excessive use of ibuprofen -- up to 8000mg  over 48 hrs. Max allowable is 6400mg  over that period. She does not endorse single ingestion over 100 mg/kg. She has no symptoms of NSAID overdose.     Renne Crigler, PA-C 05/18/14 1219

## 2014-05-18 NOTE — ED Notes (Signed)
Pt c/o increasing L side anterior and posterior headache and blurred vision in L eye x 4-5 days.  Pain score 9/10.  Pt reports taking ibuprofen w/o relief.  Sts pain is relieved, if she applies pressure to areas.   Sts "it feels like sinus pressure."

## 2014-05-18 NOTE — Discharge Instructions (Signed)
Please read and follow all provided instructions.  Your diagnoses today include:  1. Acute nonintractable headache, unspecified headache type   2. Acute sinusitis, recurrence not specified, unspecified location     Tests performed today include:  Vital signs. See below for your results today.   Medications:  In the Emergency Department you received:  Reglan - antinausea/headache medication  Benadryl - antihistamine to counteract potential side effects of reglan     Oxymetazoline - nasal spray for congestion. Do not use for more than 3 days because this medicine can cause rebound congestion.   Take any prescribed medications only as directed.  Additional information:  Follow any educational materials contained in this packet.  You are having a headache. No specific cause was found today for your headache. It may have been a migraine or other cause of headache. Stress, anxiety, fatigue, and depression are common triggers for headaches.   Your headache today does not appear to be life-threatening or require hospitalization, but often the exact cause of headaches is not determined in the emergency department. Therefore, follow-up with your doctor is very important to find out what may have caused your headache and whether or not you need any further diagnostic testing or treatment.   Sometimes headaches can appear benign (not harmful), but then more serious symptoms can develop which should prompt an immediate re-evaluation by your doctor or the emergency department.  BE VERY CAREFUL not to take multiple medicines containing Tylenol (also called acetaminophen). Doing so can lead to an overdose which can damage your liver and cause liver failure and possibly death.   Follow-up instructions: Please follow-up with your primary care provider in the next 3 days for further evaluation of your symptoms.   Return instructions:   Please return to the Emergency Department if you experience  worsening symptoms.  Return if the medications do not resolve your headache, if it recurs, or if you have multiple episodes of vomiting or cannot keep down fluids.  Return if you have a change from the usual headache.  RETURN IMMEDIATELY IF you:  Develop a sudden, severe headache  Develop confusion or become poorly responsive or faint  Develop a fever above 100.70F or problem breathing  Have a change in speech, vision, swallowing, or understanding  Develop new weakness, numbness, tingling, incoordination in your arms or legs  Have a seizure  Please return if you have any other emergent concerns.  Additional Information:  Your vital signs today were: BP 114/70   Pulse 95   Temp(Src) 98.3 F (36.8 C) (Oral)   Resp 16   SpO2 100%   LMP 04/23/2014 If your blood pressure (BP) was elevated above 135/85 this visit, please have this repeated by your doctor within one month. --------------

## 2014-05-21 NOTE — ED Provider Notes (Signed)
Medical screening examination/treatment/procedure(s) were performed by non-physician practitioner and as supervising physician I was immediately available for consultation/collaboration.   Mohamud Mrozek T Jilliann Subramanian, MD 05/21/14 1709 

## 2014-05-24 ENCOUNTER — Emergency Department (HOSPITAL_COMMUNITY)
Admission: EM | Admit: 2014-05-24 | Discharge: 2014-05-24 | Disposition: A | Discharge status: Home / Self Care | Payer: Medicaid - Out of State | Attending: Emergency Medicine | Admitting: Emergency Medicine

## 2014-05-24 ENCOUNTER — Encounter (HOSPITAL_COMMUNITY): Payer: Self-pay | Admitting: Emergency Medicine

## 2014-05-24 DIAGNOSIS — Z792 Long term (current) use of antibiotics: Secondary | ICD-10-CM | POA: Insufficient documentation

## 2014-05-24 DIAGNOSIS — Z8669 Personal history of other diseases of the nervous system and sense organs: Secondary | ICD-10-CM | POA: Insufficient documentation

## 2014-05-24 DIAGNOSIS — F17200 Nicotine dependence, unspecified, uncomplicated: Secondary | ICD-10-CM | POA: Insufficient documentation

## 2014-05-24 DIAGNOSIS — K047 Periapical abscess without sinus: Secondary | ICD-10-CM | POA: Insufficient documentation

## 2014-05-24 DIAGNOSIS — Z79899 Other long term (current) drug therapy: Secondary | ICD-10-CM | POA: Diagnosis not present

## 2014-05-24 DIAGNOSIS — R519 Headache, unspecified: Secondary | ICD-10-CM

## 2014-05-24 DIAGNOSIS — R51 Headache: Secondary | ICD-10-CM | POA: Diagnosis not present

## 2014-05-24 MED ORDER — HYDROCODONE-ACETAMINOPHEN 5-325 MG PO TABS: ORAL_TABLET | ORAL | Status: DC

## 2014-05-24 MED ORDER — SODIUM CHLORIDE 0.9 % IV BOLUS (SEPSIS)
1000.0000 mL | Freq: Once | INTRAVENOUS | Status: AC
  Administered 2014-05-24: 1000 mL via INTRAVENOUS

## 2014-05-24 MED ORDER — AMOXICILLIN 500 MG PO CAPS: 500.0000 mg | ORAL_CAPSULE | Freq: Three times a day (TID) | ORAL | Status: DC

## 2014-05-24 MED ORDER — DIPHENHYDRAMINE HCL 50 MG/ML IJ SOLN
25.0000 mg | Freq: Once | INTRAMUSCULAR | Status: AC
  Administered 2014-05-24: 25 mg via INTRAVENOUS
  Filled 2014-05-24: qty 1

## 2014-05-24 MED ORDER — BUPIVACAINE-EPINEPHRINE (PF) 0.5% -1:200000 IJ SOLN
1.8000 mL | Freq: Once | INTRAMUSCULAR | Status: AC
  Administered 2014-05-24: 1.8 mL
  Filled 2014-05-24: qty 1.8

## 2014-05-24 MED ORDER — DEXAMETHASONE SODIUM PHOSPHATE 10 MG/ML IJ SOLN
10.0000 mg | Freq: Once | INTRAMUSCULAR | Status: AC
  Administered 2014-05-24: 10 mg via INTRAVENOUS
  Filled 2014-05-24: qty 1

## 2014-05-24 MED ORDER — METOCLOPRAMIDE HCL 5 MG/ML IJ SOLN
10.0000 mg | Freq: Once | INTRAMUSCULAR | Status: AC
  Administered 2014-05-24: 10 mg via INTRAVENOUS
  Filled 2014-05-24: qty 2

## 2014-05-24 NOTE — ED Notes (Signed)
Pt states she was here on the 19th for headache.  A few days after she was here, she was still having headache but it moved down in her jaw.  Pt woke up 4 days ago w/ rt sided facial swelling.  States she has been taking tylenol and ibuprofen w/ no relief.  Moderate swelling noted to rt side of pt's face.

## 2014-05-24 NOTE — Discharge Instructions (Signed)
Take vicodin for breakthrough pain, do not drink alcohol, drive, care for children or do other critical tasks while taking vicodin. ° °Return to the emergency room for fever, change in vision, redness to the face that rapidly spreads towards the eye, nausea or vomiting, difficulty swallowing or shortness of breath. °  °Apply warm compresses to jaw throughout the day.  ° °Take your antibiotics as directed and to the end of the course. DO NOT drink alcohol when taking metronidazole, it will make you very sick!  ° °Followup with a dentist is very important for ongoing evaluation and management of recurrent dental pain. Return to emergency department for emergent changing or worsening symptoms." ° °Low-cost dental clinic: °**Kathy Bird  at [phone]**  °**Kathy Bird at [phone] [address]**   ° °You may also call [phone] ° °Dental Assistance °If the dentist on-call cannot see you, please use the resources below: ° ° °Patients with Medicaid: [facility] °[address], [phone] °[address], [phone] ° °If unable to pay, or uninsured, contact [facility] ([phone]) or [facility] ([phone] in [place], [phone] in [place]) to become qualified for the adult dental clinic ° °Other Low-Cost Community Dental Services: °[facility]- [address], [place], [place], [address] °   [phone], Ext. [phone] °   2nd and 4th Thursday of the month at 6:30am °   10 clients each day by appointment, can sometimes see walk-in     patients if someone does not show for an appointment °[facility]- [address], [place], [place], [address] °   [phone] °[facility]- [address], [place], [place], [address] °   [phone] ° °[facility]- [phone] °[facility]- [phone] °[facility]- [phone] ° °

## 2014-05-24 NOTE — ED Provider Notes (Signed)
CSN: [id number]     Arrival date & time 05/24/14  0941 History   First MD Initiated Contact with Patient 05/24/14 1008     Chief Complaint  Patient presents with  . Facial Swelling  . Headache     (Consider location/radiation/quality/duration/timing/severity/associated sxs/prior Treatment) HPI  Kathy Bird is a 28 y.o. female complaining of right lower jaw dental swelling worsening over the course of 4 days associated with dental pain. Denies fever/chills, difficulty opening jaw, difficulty swallowing, SOB, gum swelling,  neck swelling. Patient also reports a left-sided occipital parietal headache rated at 8/10, not relieved by Motrin and Tylenol. Patient states this is typical for her headaches also reports some slight blurred vision. Pt denies fever, rash, confusion, cervicalgia, LOC/syncope,N/V, numbness, weakness, dysarthria, ataxia, thunderclap onset, exacerbation with exertion or valsalva, exacerbation in morning, CP, SOB, abdominal pain.   Past Medical History  Diagnosis Date  . 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 Adenoidectomy     No family history on file. History  Substance Use Topics  . Smoking status: Current Every Day Smoker -- 5 years    Types: Cigarettes  . Smokeless tobacco: Never Used  . Alcohol Use: No     Comment: socially   OB History   Grav Para Term Preterm Abortions TAB SAB Ect Mult Living   2 1 1  0 1 1 0 0 0 1     Review of Systems  10 systems reviewed and found to be negative, except as noted in the HPI.   Allergies  Peanut-containing drug products and Shellfish allergy  Home Medications   Prior to Admission medications   Medication Sig Start Date End Date Taking? Authorizing Provider  ibuprofen (ADVIL,MOTRIN) 200 MG tablet Take 800 mg by mouth every 6 (six) hours as needed for moderate pain. For pain   Yes Historical Provider, MD  Multiple Vitamin (MULTIVITAMIN WITH  MINERALS) TABS tablet Take 1 tablet by mouth daily.   Yes Historical Provider, MD  oxymetazoline (AFRIN NASAL SPRAY) 0.05 % nasal spray Place 1 spray into both nostrils 2 (two) times daily. Do not use for more than 3 days. 05/18/14  Yes Renne Crigler, PA-C  amoxicillin (AMOXIL) 500 MG capsule Take 1 capsule (500 mg total) by mouth 3 (three) times daily. 05/24/14   Zailee Vallely, PA-C  HYDROcodone-acetaminophen (NORCO/VICODIN) 5-325 MG per tablet Take 1-2 tablets by mouth every 6 hours as needed for pain. 05/24/14   Jeromy Borcherding, PA-C   BP 131/86  Pulse 87  Temp(Src) 98.1 F (36.7 C) (Oral)  Resp 16  SpO2 100%  LMP 04/22/2014 Physical Exam  Nursing note and vitals reviewed. Constitutional: She is oriented to person, place, and time. She appears well-developed and well-nourished. No distress.  Very long and thick. Eyelashes  HENT:  Head: Normocephalic and atraumatic.  Mouth/Throat: Oropharynx is clear and moist.  Right lower jaw facial swelling. Patient has area of fluctuance at  Tooth 30.  Generally good dentition, no gingival swelling, erythema or tenderness to palpation. Patient is handling their secretions. There is no tenderness to palpation or firmness underneath tongue bilaterally. No trismus.    Eyes: Conjunctivae and EOM are normal. Pupils are equal, round, and reactive to light.  Neck: Normal range of motion. Neck supple.  FROM to C-spine. Pt can touch chin to chest without discomfort. No TTP of midline cervical spine.   Cardiovascular: Normal rate, regular rhythm  and intact distal pulses.   Pulmonary/Chest: Effort normal and breath sounds normal. No stridor. She has no wheezes. She has no rales. She exhibits no tenderness.  Abdominal: Soft. She exhibits no distension and no mass. There is no tenderness. There is no rebound and no guarding.  Musculoskeletal: Normal range of motion.  Neurological: She is alert and oriented to person, place, and time.  II-Visual fields grossly  intact. III/IV/VI-Extraocular movements intact.  Pupils reactive bilaterally. V/VII-Smile symmetric, equal eyebrow raise,  facial sensation intact VIII- Hearing grossly intact IX/X-Normal gag XI-bilateral shoulder shrug XII-midline tongue extension Motor: 5/5 bilaterally with normal tone and bulk Cerebellar: Normal finger-to-nose  and normal heel-to-shin test.   Romberg negative Ambulates with a coordinated gait   Skin: No rash noted.  Psychiatric: She has a normal mood and affect.    ED Course  NERVE BLOCK Date/Time: 05/24/2014 12:34 PM Performed by: Wynetta Emery Authorized by: Wynetta Emery Consent: Verbal consent obtained. Consent given by: patient Required items: required blood products, implants, devices, and special equipment available Patient identity confirmed: verbally with patient Indications: pain relief Body area: face/mouth Nerve: inferior alveolar Laterality: right Patient sedated: no Patient position: sitting Needle gauge: 27 G Location technique: anatomical landmarks Local anesthetic: bupivacaine 0.5% with epinephrine Anesthetic total: 1.8 ml Outcome: pain improved Patient tolerance: Patient tolerated the procedure well with no immediate complications.   INCISION AND DRAINAGE Performed by: Wynetta Emery Consent: Verbal consent obtained. Risks and benefits: risks, benefits and alternatives were discussed Type: abscess  Body area: Right mandibular  Anesthesia: local infiltration  Incision was made with a scalpel.  Local anesthetic: 0.5% bupivacaine with epinephrine   Anesthetic total: 1.8 ml  Complexity: complex Blunt dissection to break up loculations  Drainage: purulent  Drainage amount: Scant   Packing material:   Patient tolerance: Patient tolerated the procedure well with no immediate complications.       (including critical care time) Labs Review Labs Reviewed - No data to display  Imaging Review No results  found.   EKG Interpretation None      MDM   Final diagnoses:  Dental abscess  Nonintractable headache, unspecified chronicity pattern, unspecified headache type    Filed Vitals:   05/24/14 0948 05/24/14 1214  BP: 120/75 131/86  Pulse: 86 87  Temp: 98.1 F (36.7 C)   TempSrc: Oral   Resp: 17 16  SpO2: 100% 100%    Medications  bupivacaine-epinephrine (MARCAINE W/ EPI) 0.5% -1:200000 injection 1.8 mL (1.8 mLs Infiltration Given by Other 05/24/14 1155)  sodium chloride 0.9 % bolus 1,000 mL (0 mLs Intravenous Stopped 05/24/14 1223)  metoCLOPramide (REGLAN) injection 10 mg (10 mg Intravenous Given 05/24/14 1112)  diphenhydrAMINE (BENADRYL) injection 25 mg (25 mg Intravenous Given 05/24/14 1112)  dexamethasone (DECADRON) injection 10 mg (10 mg Intravenous Given 05/24/14 1115)    Kathy Bird is a 28 y.o. female presenting with HA. Presentation is like pts typical HA and non concerning for [facility], ICH, Meningitis, or temporal arteritis. Pt is afebrile with no focal neuro deficits, nuchal rigidity, or change in vision. Pt is to follow up with PCP to discuss prophylactic medication. Pt verbalizes understanding and is agreeable with plan to dc.  She also presents with right mandibular dental abscess. No signs of airway compromise. Patient given dental block 9D performed with scant purulent drainage. Patient started on amoxicillin asked to follow with dentist. Return precautions discussed  Evaluation does not show pathology that would require ongoing emergent intervention or inpatient treatment. Pt  is hemodynamically stable and mentating appropriately. Discussed findings and plan with patient/guardian, who agrees with care plan. All questions answered. Return precautions discussed and outpatient follow up given.   Discharge Medication List as of 05/24/2014 11:59 AM    START taking these medications   Details  amoxicillin (AMOXIL) 500 MG capsule Take 1 capsule (500 mg total) by mouth 3 (three)  times daily., Starting 05/24/2014, Until Discontinued, Print    HYDROcodone-acetaminophen (NORCO/VICODIN) 5-325 MG per tablet Take 1-2 tablets by mouth every 6 hours as needed for pain., Print             Wynetta Emeryicole Miguelina Fore, PA-C 05/24/14 1240

## 2014-05-25 NOTE — ED Provider Notes (Signed)
Medical screening examination/treatment/procedure(s) were performed by non-physician practitioner and as supervising physician I was immediately available for consultation/collaboration.    Joselin Crandell, MD 05/25/14 0800 

## 2014-09-01 ENCOUNTER — Encounter (HOSPITAL_COMMUNITY): Payer: Self-pay | Admitting: Emergency Medicine

## 2014-10-13 ENCOUNTER — Encounter (HOSPITAL_COMMUNITY): Payer: Self-pay | Admitting: Emergency Medicine

## 2014-10-13 ENCOUNTER — Emergency Department (HOSPITAL_COMMUNITY)
Admission: EM | Admit: 2014-10-13 | Discharge: 2014-10-13 | Disposition: A | Discharge status: Home / Self Care | Payer: 59 | Attending: Emergency Medicine | Admitting: Emergency Medicine

## 2014-10-13 DIAGNOSIS — Z79899 Other long term (current) drug therapy: Secondary | ICD-10-CM | POA: Insufficient documentation

## 2014-10-13 DIAGNOSIS — Z720 Tobacco use: Secondary | ICD-10-CM | POA: Insufficient documentation

## 2014-10-13 DIAGNOSIS — Z792 Long term (current) use of antibiotics: Secondary | ICD-10-CM | POA: Insufficient documentation

## 2014-10-13 DIAGNOSIS — K0889 Other specified disorders of teeth and supporting structures: Secondary | ICD-10-CM

## 2014-10-13 DIAGNOSIS — K088 Other specified disorders of teeth and supporting structures: Secondary | ICD-10-CM | POA: Insufficient documentation

## 2014-10-13 MED ORDER — HYDROCODONE-ACETAMINOPHEN 5-325 MG PO TABS: 1.0000 | ORAL_TABLET | Freq: Four times a day (QID) | ORAL | Status: DC | PRN

## 2014-10-13 MED ORDER — PENICILLIN V POTASSIUM 500 MG PO TABS: 500.0000 mg | ORAL_TABLET | Freq: Four times a day (QID) | ORAL | Status: AC

## 2014-10-13 MED ORDER — OXYCODONE-ACETAMINOPHEN 5-325 MG PO TABS
2.0000 | ORAL_TABLET | Freq: Once | ORAL | Status: AC
  Administered 2014-10-13: 2 via ORAL
  Filled 2014-10-13: qty 2

## 2014-10-13 NOTE — ED Notes (Signed)
Patient complains of dental pain x1 week.  Patient has obvious dental carry in right lower back molar.  Patient denies fever and N/V.

## 2014-10-13 NOTE — ED Provider Notes (Signed)
CSN: [id number]     Arrival date & time 10/13/14  1358 History  This chart was scribed for non-physician practitioner, Santiago GladHeather Glenice Ciccone, PA-C working with Mirian MoMatthew Gentry, MD, by Jarvis Morganaylor Ferguson, ED Scribe. This patient was seen in room WTR5/WTR5 and the patient's care was started at 2:40 PM.    Chief Complaint  Patient presents with  . Dental Pain    The history is provided by the patient. No language interpreter was used.    HPI Comments: Kathy Bird is a 28 y.o. female who presents to the Emergency Department complaining of gradually worsening lower right sided dental pain for 1 week. Pt states that she had a previous abscess to her tooth on the lower right side that eventually went away. She notes her tongue feels like razor blades when she swallows but that is able to swallow. She is having associated mild swelling to right jaw and is unable to chew due to pain. She has been taking BC powder, Ibuprofen and doing salt water rinses with no relief. She denies any otalgia, fever, chills, nausea, or vomiting.      Past Medical History  Diagnosis Date  . 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 last seizure when [age]yrs old   Past Surgical History  Procedure Laterality Date  . Adenoidectomy     No family history on file. History  Substance Use Topics  . Smoking status: Current Every Day Smoker -- 5 years    Types: Cigarettes  . Smokeless tobacco: Never Used  . Alcohol Use: No     Comment: socially   OB History    Gravida Para Term Preterm AB TAB SAB Ectopic Multiple Living   2 1 1  0 1 1 0 0 0 1     Review of Systems  Constitutional: Negative for fever and chills.  HENT: Positive for dental problem and facial swelling. Negative for ear pain and trouble swallowing.   Gastrointestinal: Negative for nausea and vomiting.  All other systems reviewed and are negative.     Allergies  Peanut-containing drug products and Shellfish allergy  Home Medications   Prior to Admission medications    Medication Sig Start Date End Date Taking? Authorizing Provider  amoxicillin (AMOXIL) 500 MG capsule Take 1 capsule (500 mg total) by mouth 3 (three) times daily. 05/24/14   Nicole Pisciotta, PA-C  HYDROcodone-acetaminophen (NORCO/VICODIN) 5-325 MG per tablet Take 1-2 tablets by mouth every 6 hours as needed for pain. 05/24/14   Nicole Pisciotta, PA-C  ibuprofen (ADVIL,MOTRIN) 200 MG tablet Take 800 mg by mouth every 6 (six) hours as needed for moderate pain. For pain    Historical Provider, MD  Multiple Vitamin (MULTIVITAMIN WITH MINERALS) TABS tablet Take 1 tablet by mouth daily.    Historical Provider, MD  oxymetazoline (AFRIN NASAL SPRAY) 0.05 % nasal spray Place 1 spray into both nostrils 2 (two) times daily. Do not use for more than 3 days. 05/18/14   Renne CriglerJoshua Geiple, PA-C   Triage Vitals: BP 117/70 mmHg  Pulse 95  Temp(Src) 98 F (36.7 C) (Oral)  Resp 17  SpO2 100%  LMP 09/06/2014  Physical Exam  Constitutional: She is oriented to person, place, and time. She appears well-developed and well-nourished. No distress.  HENT:  Head: Normocephalic and atraumatic.  Right Ear: Tympanic membrane, external ear and ear canal normal.  Left Ear: Tympanic membrane, external ear and ear canal normal.  Mouth/Throat: No trismus in the jaw.  No sublingual tenderness or swelling. Tender to  palpation of right lower gingiva.   Eyes: Conjunctivae and EOM are normal.  Neck: Neck supple. No tracheal deviation present.  Cardiovascular: Normal rate, regular rhythm and normal heart sounds.   Pulmonary/Chest: Effort normal and breath sounds normal. No respiratory distress.  Musculoskeletal: Normal range of motion.  Lymphadenopathy:       Head (right side): No submental and no submandibular adenopathy present.       Head (left side): No submental and no submandibular adenopathy present.  Neurological: She is alert and oriented to person, place, and time.  Skin: Skin is warm and dry.  Psychiatric: She has a  normal mood and affect. Her behavior is normal.  Nursing note and vitals reviewed.   ED Course  Procedures (including critical care time)  DIAGNOSTIC STUDIES: Oxygen Saturation is 100% on RA, normal by my interpretation.    COORDINATION OF CARE: 2:46 PM- Will order Percocet.  Pt advised of plan for treatment and pt agrees.     Labs Review Labs Reviewed - No data to display  Imaging Review No results found.   EKG Interpretation None      MDM   Final diagnoses:  None   Patient with toothache.  No gross abscess.  Exam unconcerning for Ludwig's angina or spread of infection.  Will treat with penicillin and pain medicine.  Urged patient to follow-up with dentist.    I personally performed the services described in this documentation, which was scribed in my presence. The recorded information has been reviewed and is accurate.     Santiago GladHeather Neyland Pettengill, PA-C 10/13/14 1605  Mirian MoMatthew Gentry, MD 10/14/14 [phone]

## 2014-10-13 NOTE — Discharge Instructions (Signed)
You have a dental injury. Use the resource guide listed below to help you find a dentist if you do not already have one to followup with. It is very important that you get evaluated by a dentist as soon as possible. Call tomorrow to schedule an appointment. Use your pain medication as prescribed and do not operate heavy machinery while on pain medication. Note that your pain medication contains acetaminophen (Tylenol) & its is not reccommended that you use additional acetaminophen (Tylenol) while taking this medication. Take your full course of antibiotics. Read the instructions below.  Eat a soft or liquid diet and rinse your mouth out after meals with warm water. You should see a dentist or return here at once if you have increased swelling, increased pain or uncontrolled bleeding from the site of your injury.   SEEK MEDICAL CARE IF:   You have increased pain not controlled with medicines.   You have swelling around your tooth, in your face or neck.   You have bleeding which starts, continues, or gets worse.   You have a fever >101  If you are unable to open your mouth  RESOURCE GUIDE       Emergency Department Resource Guide 1) Find a Doctor and Pay Out of Pocket Although you won't have to find out who is covered by your insurance plan, it is a good idea to ask around and get recommendations. You will then need to call the office and see if the doctor you have chosen will accept you as a new patient and what types of options they offer for patients who are self-pay. Some doctors offer discounts or will set up payment plans for their patients who do not have insurance, but you will need to ask so you aren't surprised when you get to your appointment.  2) Contact Your Local Health Department Not all health departments have doctors that can see patients for sick visits, but many do, so it is worth a call to see if yours does. If you don't know where your local health department is, you  can check in your phone book. The CDC also has a tool to help you locate your state's health department, and many state websites also have listings of all of their local health departments.  3) Find a Walk-in Clinic If your illness is not likely to be very severe or complicated, you may want to try a walk in clinic. These are popping up all over the country in pharmacies, drugstores, and shopping centers. They're usually staffed by nurse practitioners or physician assistants that have been trained to treat common illnesses and complaints. They're usually fairly quick and inexpensive. However, if you have serious medical issues or chronic medical problems, these are probably not your best option.  No Primary Care Doctor: - Call Health Connect at  [phone] - they can help you locate a primary care doctor that  accepts your insurance, provides certain services, etc. - Physician Referral Service- [phone]  Chronic Pain Problems: Organization         Address  Phone   Notes  [facility]  [phone] Patients need to be referred by their primary care doctor.   Medication Assistance: Organization         Address  Phone   Notes  [facility] Medication [facility] [address] [place] [place], [place] [address] [phone] --Must be a resident of [facility] -- Must have NO insurance coverage whatsoever (  no Medicaid/ Medicare, etc.) -- The pt. MUST have a primary care doctor that directs their care regularly and follows them in the community   MedAssist  [phone]   [employer]  [phone]    Agencies that provide inexpensive medical care: Organization         Address  Phone   Notes  [facility]  [phone]) [phone]   [facility]    [phone]   [facility] [address] [place], [place] [address] [phone]) [phone]   [facility] [place] [address], [place] [phone])  [phone]   [facility]anned Parenthood    [phone]   [facility]    [phone]   [facility]  [address], [place] Phone:  [phone], Fax:  [phone]) [phone] Hours of Operation:  9 am - 6 pm, M-F.  Also accepts Medicaid/Medicare and self-pay.  [facility]  [address], [place] Phone: [phone], Fax: [phone]. Hours of Operation:  8:30 am - 5:30 pm, M-F.  Also accepts Medicaid and self-pay.  [facility] [place] [address], [place] Phone: [phone]   [facility] [address], [place], [place] [phone], Ext. [phone] Mondays & Thursdays: 7-9 AM.  First 15 patients are seen on a first come, first serve basis.    Medicaid-accepting [facility] Providers:  Organization         Address  Phone   Notes  [facility] [address], [place] [phone] Also accepts self-pay patients.  [facility] [address], [place]  [phone]) [phone]   [facility] [address], [place] [phone]) [phone]   [facility] [address] [place] [address], [place] [phone]) [phone]   Renaye RakersVeita Bland [address], [place]   [phone] Only accepts [place] Access [place]icaid patients after they have their name applied to their card.   Self-Pay (no insurance) in [facility]:  Organization         Address  Phone   Notes  Sickle Cell Patients, [facility] [address], [place] [phone]   [facility] [address], [place] [phone]) [phone]   [facility] [place]  [address], [place] [phone]   [facility]/Dr. Osei-Bonsu  [address] [place] [address], [place] or [address], [place] [phone] Phone number for both [place] and [place] locations  is the same.  [facility] [address], [place] [phone]   [facility] [place] [address] [place] [address], [place] or [address] [phone]   [facility] [address], [place] [phone]) [phone], phone; [phone](336) [phone], fax Sees patients 1st and 3rd Saturday of every month.  Must not qualify for public or private insurance (i.e. Medicaid, Medicare, [place] Health Choice, Veterans' Benefits)  Household income should be no more than 200% of the poverty level The clinic cannot treat you if you are pregnant or think you are pregnant  Sexually transmitted diseases are not treated at the clinic.    Dental Care: Organization         Address  Phone  Notes  [facility] of Public  Health [facility] [address], [place] [phone] Accepts children up to age 26 who are enrolled in [place]icaid or [place] Health Choice; pregnant women with a Medicaid card; and children who have applied for Medicaid or [place] Health Choice, but were declined, whose parents can pay a reduced fee at time of service.  [facility] of [facility] [place]  [address], [place] [phone]) [phone] Accepts children up to age 26 who are enrolled in [place]icaid or [place] Health Choice; pregnant women with a Medicaid card; and children who have applied for Medicaid or [place] Health Choice, but were declined, whose parents can pay a reduced fee at time of service.  [facility]  [address], [place] [phone]) [phone] Patients are seen by appointment only. Walk-ins are not accepted. [facility] will see patients 26 years of age and older. Monday - Tuesday (8am-5pm) Most Wednesdays (8:30-5pm) $30 per visit, cash only  [facility]  [address], [facility] [phone]) [phone] Patients are seen by appointment only. Walk-ins are not accepted. [facility] will see  patients 26 years of age and older. One Wednesday Evening (Monthly: Volunteer Based).  $30 per visit, cash only  [employer] [facility] [facility]  [phone] for adults; Children under age 634, call Graduate Pediatric Dentistry at [phone]. Children aged 624-14, please call [phone] to request a pediatric application.  Dental services are provided in all areas of dental care including fillings, crowns and bridges, complete and partial dentures, implants, gum treatment, root canals, and extractions. Preventive care is also provided. Treatment is provided to both adults and children. Patients are selected via a lottery and there is often a waiting list.   [facility] [address], [place]  [phone] [email or web address]   [facility] [address], [place], [place] [phone], Ext. [phone] Second and Fourth Thursday of each month, opens at 6:30 AM; Clinic ends at 9 AM.  Patients are seen on a first-come first-served basis, and a limited number are seen during each clinic.   [facility]  [address], [place], [place] [phone]) [phone]   Eligibility Requirements You must have lived in [place], [place], or [place] counties for at least the last three months.   You cannot be eligible for state or federal sponsored National Cityhealthcare insurance, including CIGNAVeterans Administration, [place]icaid, or [employer]Medicare.   You generally cannot be eligible for healthcare insurance through your employer.    How to apply: Eligibility screenings are held every Tuesday and Wednesday afternoon from 1:00 pm until 4:00 pm. You do not need an appointment for the interview!  [facility] [address], [place], [place] [phone]   [facility]  [phone]   [facility]  [phone]   [facility]  [phone]    Behavioral Health Resources in the Community: Intensive Outpatient  Programs Organization         Address  Phone  Notes  [facility] [address], [place], [place] [phone]   [facility] [address], [place], [place] [phone]   [facility]: [facility] [address], [place], [place]  [phone]   [facility] [address],  [place], [place] [phone] or [phone]   Substance Abuse Resources Organization         Address  Phone  Notes  Alcohol and Drug Services  [place]  [phone]   [facility] [place]  [phone]   Chinita Pester  [phone]   Residential & Outpatient Substance Abuse Program  [phone]   Psychological Services Organization         Address  Phone  Notes  [facility] [place]  [place]  [phone]   [place] [address], [place] or [phone]    Mobile Crisis Teams Organization         Address  Phone  Notes  [facility], Mobile Crisis Care Unit  [phone]   [facility]  [address] [place], [place]   [place] [address] [place] [phone]    Self-Help/Support Groups Organization         Address  Phone             Notes  [place] of [place] - variety of support groups  [place] Call for more information  Narcotics Anonymous (NA), [facility] [address], [employer] [place]  2 meetings at this location   Special educational needs teacher         Address  Phone  Notes  ASAP Residential Treatment Rafael Gonzalez,    [place]  [phone]   [facility]  [address] [place] [address], [place], [place]   [place], [place] [phone] Admissions: 8am-3pm M-F  Incentives Substance [place] [address],    [place], [place]  [phone]   [facility] [address] [place], [place], [place]   [facility] [address]  [place], [place]   Insight Programs - Intensive Outpatient [address]een [address], [place], [place]   [facility] ([place] [place] [place].,  [place], [place] [phone] or [phone]   [facility] (RTS) [address] [place], [place] Accepts Medicaid  [facility] [place] [address]  [place] [phone] Substance Abuse/Addiction Treatment   [facility] Organization         Address  Phone  Notes  [facility]  [phone]   Domenic Schwab, PhD [address] Arlis [place], [place]   [phone] or [phone]   [place] Bernie [place], [place] [phone]   [facility] [address], [place], [place] [phone] Insurance/Medicaid/sponsorship through [facility] and Families [address], [address]                                    [place], [place] [phone] [place] [address], [place] [phone]    Dr. Adele Schilder  [phone]   [facility]. 1) [address] [place] 2) [place] 3)  [place] [address], [place] [phone]  [phone]   [place] [phone] or [phone] (After Hours)

## 2014-10-13 NOTE — ED Notes (Signed)
Pt c/o right back dental pain for about week now. Pt states that she has been using mouth rinses and taking ibuprofen at home trying to treat it but not getting any better.  Pt also states that her tongue feels like razor blades when she swallows.

## 2015-03-31 ENCOUNTER — Emergency Department (HOSPITAL_COMMUNITY)
Admission: EM | Admit: 2015-03-31 | Discharge: 2015-03-31 | Disposition: A | Discharge status: Home / Self Care | Payer: Medicaid - Out of State | Attending: Emergency Medicine | Admitting: Emergency Medicine

## 2015-03-31 ENCOUNTER — Encounter (HOSPITAL_COMMUNITY): Payer: Self-pay | Admitting: Emergency Medicine

## 2015-03-31 DIAGNOSIS — K088 Other specified disorders of teeth and supporting structures: Secondary | ICD-10-CM | POA: Insufficient documentation

## 2015-03-31 DIAGNOSIS — Z792 Long term (current) use of antibiotics: Secondary | ICD-10-CM | POA: Insufficient documentation

## 2015-03-31 DIAGNOSIS — Z79899 Other long term (current) drug therapy: Secondary | ICD-10-CM | POA: Insufficient documentation

## 2015-03-31 DIAGNOSIS — K0889 Other specified disorders of teeth and supporting structures: Secondary | ICD-10-CM

## 2015-03-31 DIAGNOSIS — Z720 Tobacco use: Secondary | ICD-10-CM | POA: Insufficient documentation

## 2015-03-31 MED ORDER — PENICILLIN V POTASSIUM 500 MG PO TABS: 500.0000 mg | ORAL_TABLET | Freq: Four times a day (QID) | ORAL | Status: AC

## 2015-03-31 MED ORDER — BUPIVACAINE-EPINEPHRINE (PF) 0.5% -1:200000 IJ SOLN
1.8000 mL | Freq: Once | INTRAMUSCULAR | Status: AC
Start: 2015-03-31 — End: 2015-03-31
  Administered 2015-03-31: 1.8 mL
  Filled 2015-03-31: qty 1.8

## 2015-03-31 NOTE — ED Provider Notes (Signed)
CSN: [id number]     Arrival date & time 03/31/15  1249 History  This chart was scribed for a non-physician practitioner, Oswaldo ConroyVictoria Chord Takahashi, PA-C working with Blane OharaJoshua Zavitz, MD by [place] Peace, ED Scribe. The patient was seen in WTR9/WTR9. The patient's care was started at 3:29 PM.    Chief Complaint  Patient presents with  . Dental Pain      Patient is a 29 y.o. female presenting with tooth pain. The history is provided by the patient. No language interpreter was used.  Dental Pain Associated symptoms: no drooling and no fever     HPI Comments: Kathy Bird is a 29 y.o. female who presents to the Emergency Department complaining of lower right-sided dental pain stemming from which she believes is a recurrent abscess to affected area. Pt reports she has tried "popping" with a pen. She denies any fever, chills, tongue swelling, or drooling. Pt does not have a dentist whom she follows up with regularly. Pt is current everyday smoker.    Past Medical History  Diagnosis Date  . 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 last seizure when [age]yrs old   Past Surgical History  Procedure Laterality Date  . Adenoidectomy     No family history on file. History  Substance Use Topics  . Smoking status: Current Every Day Smoker -- 5 years    Types: Cigarettes  . Smokeless tobacco: Never Used  . Alcohol Use: No     Comment: socially   OB History    Gravida Para Term Preterm AB TAB SAB Ectopic Multiple Living   2 1 1  0 1 1 0 0 0 1     Review of Systems  Constitutional: Negative for fever and chills.  HENT: Positive for dental problem. Negative for drooling.   Gastrointestinal: Negative for nausea and vomiting.      Allergies  Peanut-containing drug products and Shellfish allergy  Home Medications   Prior to Admission medications   Medication Sig Start Date End Date Taking? Authorizing Provider  amoxicillin (AMOXIL) 500 MG capsule Take 1 capsule (500 mg total) by mouth 3 (three) times daily. 05/24/14    Nicole Pisciotta, PA-C  HYDROcodone-acetaminophen (NORCO/VICODIN) 5-325 MG per tablet Take 1-2 tablets by mouth every 6 (six) hours as needed. 10/13/14   Heather Laisure, PA-C  ibuprofen (ADVIL,MOTRIN) 200 MG tablet Take 800 mg by mouth every 6 (six) hours as needed for moderate pain. For pain    Historical Provider, MD  Multiple Vitamin (MULTIVITAMIN WITH MINERALS) TABS tablet Take 1 tablet by mouth daily.    Historical Provider, MD  oxymetazoline (AFRIN NASAL SPRAY) 0.05 % nasal spray Place 1 spray into both nostrils 2 (two) times daily. Do not use for more than 3 days. 05/18/14   Renne CriglerJoshua Geiple, PA-C  penicillin v potassium (VEETID) 500 MG tablet Take 1 tablet (500 mg total) by mouth 4 (four) times daily. 03/31/15 04/07/15  Oswaldo ConroyVictoria Trinita Devlin, PA-C   BP 154/78 mmHg  Pulse 90  Temp(Src) 97.6 F (36.4 C) (Oral)  Resp 18  SpO2 99%  LMP 02/08/2015 Physical Exam  Constitutional: She appears well-developed and well-nourished. No distress.  HENT:  Head: Normocephalic and atraumatic.  Mouth/Throat: Oropharynx is clear and moist. No oropharyngeal exudate.  No trismus or uvula deviation No lip, tongue, facial swelling. No swelling under tongue or tenderness. Patient handling secretions. No drooling. Patient with poor dentition. Patient with tenderness to right lower back molar with mild erythema in this gingiva . Patient with less  than 1 cm fluctuant mass to right lower canine.   Eyes: Conjunctivae are normal. Right eye exhibits no discharge. Left eye exhibits no discharge.  Neck: Normal range of motion. Neck supple.  No neck masses or tenderness.  Cardiovascular: Normal rate and regular rhythm.   Pulmonary/Chest: Effort normal and breath sounds normal. No respiratory distress. She has no wheezes.  Abdominal: Soft. She exhibits no distension. There is no tenderness.  Lymphadenopathy:    She has no cervical adenopathy.  Neurological: She is alert. Coordination normal.  Skin: Skin is warm and dry.  She is not diaphoretic.  Nursing note and vitals reviewed.   ED Course  Dental Date/Time: 03/31/2015 3:56 PM Performed by: Oswaldo Conroy Authorized by: Oswaldo Conroy Consent: Verbal consent obtained. Risks and benefits: risks, benefits and alternatives were discussed Consent given by: patient Patient understanding: patient states understanding of the procedure being performed Patient consent: the patient's understanding of the procedure matches consent given Procedure consent: procedure consent matches procedure scheduled Patient identity confirmed: verbally with patient Time out: Immediately prior to procedure a "time out" was called to verify the correct patient, procedure, equipment, support staff and site/side marked as required. Local anesthesia used: yes Anesthesia: nerve block Local anesthetic: bupivacaine 0.5% with epinephrine Patient tolerance: Patient tolerated the procedure well with no immediate complications Comments: 18g needle I and D of small <1cm abscess to outer left lower canine   (including critical care time) Labs Review Labs Reviewed - No data to display  Imaging Review No results found.   EKG Interpretation None     Medications  bupivacaine-epinephrine (MARCAINE W/ EPI) 0.5% -1:200000 injection 1.8 mL (1.8 mLs Infiltration Given 03/31/15 1544)    3:32 PM- Treatment plan was discussed with patient who verbalizes understanding and agrees.   MDM   Final diagnoses:  Pain, dental   Isn't presenting with dental pain. No drooling. No trismus. No uvula deviation. Exam not consistent with blood with angina. Patient given dental block and IND with 18-gauge needle of small abscess with minimal purulent drainage. Patient reports significant improvement after dental block. She's been given penicillin and are to follow-up with dentist. ED resources provided.  Discussed return precautions with patient. Discussed all results and patient verbalizes  understanding and agrees with plan.  I personally performed the services described in this documentation, which was scribed in my presence. The recorded information has been reviewed and is accurate.   Oswaldo Conroy, PA-C 03/31/15 1630  Blane Ohara, MD 04/04/15 [phone]

## 2015-03-31 NOTE — Discharge Instructions (Signed)
Return to the emergency room with worsening of symptoms, new symptoms or with symptoms that are concerning, especially fevers, drooling, tongue, facial swelling, unable to open mouth fully. Ibuprofen  (2 tablets ) every 5-6 hours for 3-5 days. Please take all of your antibiotics until finished!   You may develop abdominal discomfort or diarrhea from the antibiotic.  You may help offset this with probiotics which you can buy or get in yogurt. Do not eat  or take the probiotics until 2 hours after your antibiotic.  Follow up with dentist as soon as possible. Use below resources.   Emergency Department Resource Guide 1) Find a Doctor and Pay Out of Pocket Although you won't have to find out who is covered by your insurance plan, it is a good idea to ask around and get recommendations. You will then need to call the office and see if the doctor you have chosen will accept you as a new patient and what types of options they offer for patients who are self-pay. Some doctors offer discounts or will set up payment plans for their patients who do not have insurance, but you will need to ask so you aren't surprised when you get to your appointment.  2) Contact Your Local Health Department Not all health departments have doctors that can see patients for sick visits, but many do, so it is worth a call to see if yours does. If you don't know where your local health department is, you can check in your phone book. The CDC also has a tool to help you locate your state's health department, and many state websites also have listings of all of their local health departments.  3) Find a Walk-in Clinic If your illness is not likely to be very severe or complicated, you may want to try a walk in clinic. These are popping up all over the country in pharmacies, drugstores, and shopping centers. They're usually staffed by nurse practitioners or physician assistants that have been trained to treat common illnesses  and complaints. They're usually fairly quick and inexpensive. However, if you have serious medical issues or chronic medical problems, these are probably not your best option.  No Primary Care Doctor: - Call Health Connect at  [phone] - they can help you locate a primary care doctor that  accepts your insurance, provides certain services, etc. - Physician Referral Service- [phone]  Chronic Pain Problems: Organization         Address  Phone   Notes  [facility]  [phone] Patients need to be referred by their primary care doctor.   Medication Assistance: Organization         Address  Phone   Notes  [facility] Medication [facility] [address] [place], [place] [address] [phone] --Must be a resident of [facility] -- Must have NO insurance coverage whatsoever (no Medicaid/ Medicare, etc.) -- The pt. MUST have a primary care doctor that directs their care regularly and follows them in the community   [facility]  [phone]   [employer]  [phone]    Agencies that provide inexpensive medical care: Organization         Address  Phone   Notes  [facility]  [phone]   [facility]    [phone]   [facility] [address] [place], [place] [address] [phone]   [facility]  [address], [place] [phone]) [phone]   Planned Parenthood    [phone](336) [phone]   [facility]    [phone](336) [phone]   [facility]  [address], [place] Phone:  [phone], Fax:  [phone] Hours of Operation:  9 am - 6 pm, M-F.  Also accepts Medicaid/Medicare and self-pay.  [facility]  [address], [place] Phone: [phone](336) [phone], Fax: [phone]. Hours of Operation:  8:30 am - 5:30 pm, M-F.  Also accepts Medicaid and self-pay.  [facility] [place] [address], [place] Phone: [phone]) [phone]   [facility] [address], [place], [place] [phone], Ext. [phone] Mondays & Thursdays: 7-9 AM.  First 15 patients are seen on a first come, first serve basis.    Medicaid-accepting [facility] Providers:  Organization         Address  Phone   Notes  [facility] [address], [place] [phone] Also accepts self-pay patients.  [facility] [address], [place]  [phone](336) [phone]   [facility] [address], [place] [phone](336) [phone]   [facility] [address] [place] [address], [place] [phone]) [phone]   Renaye RakersVeita Bland [address], [place]   [phone] Only accepts [place] Access [place]icaid patients after they have their name applied to their card.   Self-Pay (no insurance) in [facility]:  Organization         Address  Phone   Notes  Sickle Cell Patients, [facility] [address], [place] [phone]) [phone]   [facility] [address], [place] [phone]) [phone]   [facility]  [address], [place] [phone]) [phone]   [facility]/Dr. Osei-Bonsu  [address] [place] [address], [place] or [phone], [place] [phone] Phone number for both [place] and [place] locations is the same.  [facility] [address], [place] [phone]) [phone]   [facility] [place] [address] [place] [address], [place] or [address] [phone]   [facility] [address], [place] [phone](336) [phone], phone; [phone]) [phone], fax Sees patients 1st and 3rd Saturday of every month.  Must not qualify for public or private insurance (i.e. Medicaid, Medicare, [place] Health Choice, Veterans' Benefits)  Household income should be no more than 200% of the poverty level  The clinic cannot treat you if you are pregnant or think you are pregnant  Sexually transmitted diseases are not treated at the clinic.    Dental Care: Organization         Address  Phone  Notes  [facility] of [facility] [address], [place] [phone]) [phone] Accepts children up to age 321 who are enrolled in [place]icaid or [place] Health Choice; pregnant women with a Medicaid card; and children who have applied for Medicaid or [place] Health Choice, but were declined, whose parents can pay a reduced fee at time of service.  [facility] of [facility] [place]  [address], [place] [phone]) [phone] Accepts children up to age 27 who are enrolled in [place]icaid or [place] Health Choice; pregnant women with a Medicaid card; and children who have applied for  Medicaid or [place] Health Choice, but were declined, whose parents can pay a reduced fee at time of service.  [facility]  [address], [place] [phone](336) [phone] Patients are seen by appointment only. Walk-ins are not accepted. [facility] will see patients 27 years of age and older. Monday - Tuesday (8am-5pm) Most Wednesdays (8:30-5pm) $30 per visit, cash only  [facility]  [address], [facility] [phone]) [phone] Patients are seen by appointment only. Walk-ins are not accepted. [facility] will see patients 27 years of age and older. One Wednesday Evening (Monthly: Volunteer Based).  $30 per visit, cash only  [employer]  [phone] for adults; Children under age 894, call Graduate Pediatric Dentistry at [phone]. Children aged 124-14, please call [phone] to request a pediatric application.  Dental services are provided in all areas of dental care including fillings, crowns and bridges, complete and partial dentures, implants, gum treatment, root canals, and extractions. Preventive care is  also provided. Treatment is provided to both adults and children. Patients are selected via a lottery and there is often a waiting list.   [facility] [address], [place]  [phone] [email or web address]   [facility] [address], [place], [place] [phone], Ext. [phone] Second and Fourth Thursday of each month, opens at 6:30 AM; Clinic ends at 9 AM.  Patients are seen on a first-come first-served basis, and a limited number are seen during each clinic.   [facility]  [address], [place], [place] [phone]) [phone]   Eligibility Requirements You must have lived in [place], or [place] counties for at least the last three months.   You cannot be eligible for state or federal sponsored National Cityhealthcare insurance, including CIGNAVeterans Administration, [place]icaid, or [employer] EntertainmentMedicare.   You generally cannot be eligible for healthcare insurance through your employer.    How to apply: Eligibility screenings are held every Tuesday and Wednesday afternoon from 1:00 pm until 4:00 pm. You do not need an appointment for the interview!  [facility] [address], [place], [place] [phone]   [facility]  [phone]   [facility]  [phone]   [facility]  [phone]    Behavioral Health Resources in the Community: Intensive Outpatient Programs Organization         Address  Phone  Notes  [facility] [address], [place], [place] [phone]   [facility] [address], [place], [place] [phone]   [facility] [address], [place], [place]  [phone]   [facility] [address],  [place], [place] [phone] or [phone]   Substance Abuse Resources Organization         Address  Phone  Notes  [facility]  [phone]   [facility]  [phone]   [facility]  [phone]   [facility]  [phone]   Residential & Outpatient Substance Abuse Program  [phone]   Psychological Services Organization         Address  Phone  Notes  [facility]  [phone]- [phone]   [facility]  [phone]- [phone]   [facility] [address], [place] [phone] or [phone]    Mobile Crisis Teams Organization         Address  Phone  Notes  [facility], Mobile Crisis Care Unit  [phone]   [facility]  [address] [place], [place] [phone]   [facility], The[facility]on DeEsch [address] [place] [phone]    Self-Help/Support Groups Organization         Address  Phone             Notes  [facility] [place] - variety of support groups  [phone]- [phone] Call for more information  Narcotics Anonymous (NA), [facility]aring [facility] [address], [place]  2 meetings at this location   Statisticianesidential Treatment Programs Organization         Address  Phone  Notes  [facility] [address],    [place]  [phone]   [facility]  [address], [place] [phone], [place], [place] [phone]   [facility] [address], [place] [phone] Admissions: 8am-3pm M-F  [facility] [address]    [place], [place] [phone]   [facility] [address], [place], [place] [phone]   [facility] [address]  [place], [place] [phone]   [facility] - Intensive Outpatient [address], [place], [place] [phone]   [facility] ([facility] [address]  [place], [place] [phone] or [phone]   [facility] ([facility]) [address] [place], [place] [phone] Accepts Medicaid  [facility] [address]  [place] [phone]  Substance Abuse/Addiction Treatment   [facility] Behavioral Health Resources Organization         Address  Phone  Notes  [facility]  [phone]   Angie FavaJulie Brannon, PhD [address], [address] [place], [place]   [phone] or [phone]   [facility] [place]   [address] [place], [place] [phone](336) [phone]   [facility] [address], [place], [place] [phone](336) [phone] Insurance/Medicaid/sponsorship through [facility]  [facility] Families [address]                                    [place], [place] [phone]) [phone] Therapy/tele-psych/case  [facility] [address]   [place], [place] [phone]    Dr. Lolly MustacheArfeen  [phone]   [facility]  [employer] [facility] 1) [address], [place] 2) [address], [place] 3)  [address], [place] [phone]  [phone]) [phone]   [facility] Child Abuse Hotline [phone]) [phone] or [phone](336) [phone] (After Hours)

## 2015-03-31 NOTE — ED Notes (Signed)
Per patient states right lower bottom tooth infected

## 2015-10-17 ENCOUNTER — Emergency Department (HOSPITAL_COMMUNITY)
Admission: EM | Admit: 2015-10-17 | Discharge: 2015-10-17 | Disposition: A | Discharge status: Home / Self Care | Payer: Medicaid - Out of State | Attending: Emergency Medicine | Admitting: Emergency Medicine

## 2015-10-17 ENCOUNTER — Encounter (HOSPITAL_COMMUNITY): Payer: Self-pay | Admitting: Emergency Medicine

## 2015-10-17 DIAGNOSIS — K029 Dental caries, unspecified: Secondary | ICD-10-CM | POA: Insufficient documentation

## 2015-10-17 DIAGNOSIS — Z792 Long term (current) use of antibiotics: Secondary | ICD-10-CM | POA: Insufficient documentation

## 2015-10-17 DIAGNOSIS — K002 Abnormalities of size and form of teeth: Secondary | ICD-10-CM | POA: Insufficient documentation

## 2015-10-17 DIAGNOSIS — Z79899 Other long term (current) drug therapy: Secondary | ICD-10-CM | POA: Insufficient documentation

## 2015-10-17 DIAGNOSIS — K047 Periapical abscess without sinus: Secondary | ICD-10-CM

## 2015-10-17 DIAGNOSIS — F17210 Nicotine dependence, cigarettes, uncomplicated: Secondary | ICD-10-CM | POA: Insufficient documentation

## 2015-10-17 MED ORDER — IBUPROFEN 400 MG PO TABS
800.0000 mg | ORAL_TABLET | Freq: Once | ORAL | Status: AC
  Administered 2015-10-17: 800 mg via ORAL
  Filled 2015-10-17: qty 2

## 2015-10-17 MED ORDER — PENICILLIN V POTASSIUM 500 MG PO TABS: 500.0000 mg | ORAL_TABLET | Freq: Four times a day (QID) | ORAL | Status: DC

## 2015-10-17 MED ORDER — IBUPROFEN 800 MG PO TABS: 800.0000 mg | ORAL_TABLET | Freq: Three times a day (TID) | ORAL | Status: DC | PRN

## 2015-10-17 MED ORDER — HYDROCODONE-ACETAMINOPHEN 5-325 MG PO TABS: 1.0000 | ORAL_TABLET | Freq: Four times a day (QID) | ORAL | Status: DC | PRN

## 2015-10-17 NOTE — ED Notes (Signed)
Pt. Stated, I've been having facial pain and thinking its a tooth ache or something. The whole right side of my face hurts really bad.  I've taken Ibuprofen, claritin, and oral gel, Nothing helps.

## 2015-10-17 NOTE — Discharge Instructions (Signed)
Return here as needed.  Followup with the dentist provided. rinse with warm water and peroxide 3 times a day °

## 2015-10-17 NOTE — ED Notes (Signed)
PT st's she thinks she has a sinus infection.  C/o pain to right side of face and ear.  St's pain is worse at night. Feels like pressure

## 2015-10-21 NOTE — ED Provider Notes (Signed)
CSN: [id number]     Arrival date & time 10/17/15  1259 History   First MD Initiated Contact with Patient 10/17/15 1505     Chief Complaint  Patient presents with  . Facial Pain     (Consider location/radiation/quality/duration/timing/severity/associated sxs/prior Treatment) HPI  Patient presents to the emergency department with facial pain that she thinks is caused by a toothache.  The patient states that this started several days ago.  She states that nothing seems make the condition better, but palpation of the area makes the pain worse.  Patient states that she does have several teeth that are bothering her.  She denies fever, nausea, vomiting, weakness, dizziness, headache, blurred vision, neck pain, chest pain, shortness of breath or syncope.  The patient states that she did not take any medications prior to arrival Past Medical History  Diagnosis Date  . Seizures (HCC)     as child. last seizure when [age]yrs old   Past Surgical History  Procedure Laterality Date  . Adenoidectomy     No family history on file. Social History  Substance Use Topics  . Smoking status: Current Every Day Smoker -- 5 years    Types: Cigarettes  . Smokeless tobacco: Never Used  . Alcohol Use: No     Comment: socially   OB History    Gravida Para Term Preterm AB TAB SAB Ectopic Multiple Living   2 1 1  0 1 1 0 0 0 1     Review of Systems   All other systems negative except as documented in the HPI. All pertinent positives and negatives as reviewed in the HPI. Allergies  Peanut-containing drug products and Shellfish allergy  Home Medications   Prior to Admission medications   Medication Sig Start Date End Date Taking? Authorizing Provider  amoxicillin (AMOXIL) 500 MG capsule Take 1 capsule (500 mg total) by mouth 3 (three) times daily. 05/24/14   Nicole Pisciotta, PA-C  HYDROcodone-acetaminophen (NORCO/VICODIN) 5-325 MG tablet Take 1 tablet by mouth every 6 (six) hours as needed for moderate  pain. 10/17/15   Charlestine Nighthristopher Faline Langer, PA-C  ibuprofen (ADVIL,MOTRIN) 800 MG tablet Take 1 tablet (800 mg total) by mouth every 8 (eight) hours as needed. 10/17/15   Charlestine Nighthristopher Johanan Skorupski, PA-C  Multiple Vitamin (MULTIVITAMIN WITH MINERALS) TABS tablet Take 1 tablet by mouth daily.    Historical Provider, MD  oxymetazoline (AFRIN NASAL SPRAY) 0.05 % nasal spray Place 1 spray into both nostrils 2 (two) times daily. Do not use for more than 3 days. 05/18/14   Renne CriglerJoshua Geiple, PA-C  penicillin v potassium (VEETID) 500 MG tablet Take 1 tablet (500 mg total) by mouth 4 (four) times daily. 10/17/15   Luberta Grabinski, PA-C   BP 125/85 mmHg  Pulse 83  Temp(Src) 98.3 F (36.8 C) (Oral)  Resp 16  Ht 5' 4.5" (1.638 m)  Wt 65.772 kg  BMI 24.51 kg/m2  SpO2 100%  LMP 10/05/2015 Physical Exam  Constitutional: She appears well-developed and well-nourished. No distress.  HENT:  Head: Normocephalic and atraumatic.  Mouth/Throat: Uvula is midline, oropharynx is clear and moist and mucous membranes are normal. She does not have dentures. No oral lesions. No trismus in the jaw. Abnormal dentition. Dental abscesses and dental caries present. No uvula swelling or lacerations.    Eyes: Pupils are equal, round, and reactive to light.  Neck: Normal range of motion. Neck supple.  Cardiovascular: Normal rate, regular rhythm and normal heart sounds.  Exam reveals no gallop and no friction rub.  No murmur heard. Pulmonary/Chest: Effort normal and breath sounds normal. No respiratory distress.  Skin: Skin is warm and dry. No rash noted. No erythema.  Psychiatric: She has a normal mood and affect. Her behavior is normal.  Nursing note and vitals reviewed.   ED Course  Procedures (including critical care time) Labs Review Labs Reviewed - No data to display  Imaging Review No results found. I have personally reviewed and evaluated these images and lab results as part of my medical decision-making.  Patient be  treated for dental abscess.  Told to return here as needed.  Given dental follow-up.  Patient agrees the plan and all questions were answered   Charlestine Night, PA-C 10/21/15 0532  Melene Plan, DO 10/21/15 [phone]

## 2015-11-06 ENCOUNTER — Emergency Department (HOSPITAL_BASED_OUTPATIENT_CLINIC_OR_DEPARTMENT_OTHER)
Admission: EM | Admit: 2015-11-06 | Discharge: 2015-11-06 | Disposition: A | Discharge status: Home / Self Care | Payer: Medicaid - Out of State | Attending: Emergency Medicine | Admitting: Emergency Medicine

## 2015-11-06 ENCOUNTER — Encounter (HOSPITAL_BASED_OUTPATIENT_CLINIC_OR_DEPARTMENT_OTHER): Payer: Self-pay | Admitting: Emergency Medicine

## 2015-11-06 ENCOUNTER — Emergency Department (HOSPITAL_BASED_OUTPATIENT_CLINIC_OR_DEPARTMENT_OTHER): Payer: Medicaid - Out of State

## 2015-11-06 DIAGNOSIS — F17210 Nicotine dependence, cigarettes, uncomplicated: Secondary | ICD-10-CM | POA: Insufficient documentation

## 2015-11-06 DIAGNOSIS — K029 Dental caries, unspecified: Secondary | ICD-10-CM

## 2015-11-06 DIAGNOSIS — Z79899 Other long term (current) drug therapy: Secondary | ICD-10-CM | POA: Insufficient documentation

## 2015-11-06 DIAGNOSIS — R51 Headache: Secondary | ICD-10-CM | POA: Insufficient documentation

## 2015-11-06 MED ORDER — NAPROXEN 375 MG PO TABS: 375.0000 mg | ORAL_TABLET | Freq: Two times a day (BID) | ORAL | Status: DC

## 2015-11-06 MED ORDER — CLINDAMYCIN HCL 300 MG PO CAPS: 300.0000 mg | ORAL_CAPSULE | Freq: Four times a day (QID) | ORAL | Status: DC

## 2015-11-06 MED ORDER — OXYCODONE-ACETAMINOPHEN 5-325 MG PO TABS
1.0000 | ORAL_TABLET | Freq: Once | ORAL | Status: AC
  Administered 2015-11-06: 1 via ORAL
  Filled 2015-11-06: qty 1

## 2015-11-06 NOTE — ED Notes (Signed)
Pt seen at ed 2 weeks ago, states she can hold cold fluids in her mouth and relieves pain very short term, she has used 2 tubes of oragel in last day, pt has took 4 ibuprofen 2 midol, 2 day quil,

## 2015-11-06 NOTE — Discharge Instructions (Signed)

## 2015-11-06 NOTE — ED Provider Notes (Signed)
CSN: [id number]     Arrival date & time 11/06/15  1109 History   First MD Initiated Contact with Patient 11/06/15 1217     Chief Complaint  Patient presents with  . Dental Pain     (Consider location/radiation/quality/duration/timing/severity/associated sxs/prior Treatment) Patient is a 30 y.o. female presenting with tooth pain. The history is provided by the patient and medical records. No language interpreter was used.  Dental Pain Location:  Generalized Quality:  Pressure-like, throbbing and constant Severity:  Severe Onset quality:  Sudden Duration:  2 weeks Timing:  Constant Progression:  Worsening Chronicity:  New Context: dental caries and poor dentition   Relieved by:  Ice Worsened by:  Pressure Ineffective treatments:  NSAIDs and topical anesthetic gel Associated symptoms: facial pain   Associated symptoms: no fever     Past Medical History  Diagnosis Date  . Seizures (HCC)     as child. last seizure when 36yrs old   Past Surgical History  Procedure Laterality Date  . Adenoidectomy     History reviewed. No pertinent family history. Social History  Substance Use Topics  . Smoking status: Current Every Day Smoker -- 5 years    Types: Cigarettes  . Smokeless tobacco: Never Used  . Alcohol Use: No     Comment: socially   OB History    Gravida Para Term Preterm AB TAB SAB Ectopic Multiple Living   2 1 1  0 1 1 0 0 0 1     Review of Systems  Constitutional: Negative for fever.  All other systems reviewed and are negative.     Allergies  Peanut-containing drug products and Shellfish allergy  Home Medications   Prior to Admission medications   Medication Sig Start Date End Date Taking? Authorizing Provider  Multiple Vitamin (MULTIVITAMIN WITH MINERALS) TABS tablet Take 1 tablet by mouth daily.    Historical Provider, MD  oxymetazoline (AFRIN NASAL SPRAY) 0.05 % nasal spray Place 1 spray into both nostrils 2 (two) times daily. Do not use for more than 3  days. 05/18/14   Renne Crigler, PA-C   BP 141/90 mmHg  Resp 18  Ht 5' 4.5" (1.638 m)  Wt 63.504 kg  BMI 23.67 kg/m2  SpO2 100%  LMP 11/06/2015 Physical Exam  Constitutional: She is oriented to person, place, and time. She appears well-developed and well-nourished.  HENT:  Head: Normocephalic.  Mouth/Throat: No trismus in the jaw. Dental caries present. No oropharyngeal exudate or tonsillar abscesses.    Eyes: Conjunctivae are normal.  Neck: Neck supple.  Cardiovascular: Normal rate and regular rhythm.   Pulmonary/Chest: Effort normal and breath sounds normal.  Abdominal: Soft. Bowel sounds are normal.  Musculoskeletal: She exhibits no edema or tenderness.  Lymphadenopathy:    She has no cervical adenopathy.  Neurological: She is alert and oriented to person, place, and time.  Skin: Skin is warm and dry.  Psychiatric: She has a normal mood and affect.  Nursing note and vitals reviewed.   ED Course  Procedures (including critical care time) Labs Review Labs Reviewed - No data to display  Imaging Review Ct Maxillofacial Wo Cm  11/06/2015  CLINICAL DATA:  Right jaw pain due to tooth for 1 month EXAM: CT MAXILLOFACIAL WITHOUT CONTRAST TECHNIQUE: Multidetector CT imaging of the maxillofacial structures was performed. Multiplanar CT image reconstructions were also generated. A small metallic BB was placed on the right temple in order to reliably differentiate right from left. COMPARISON:  None. FINDINGS: Poor dentition with large cavities  in teeth 4, 15, 16, 17, 18, 30 and 32. There are variable degrees of periapical erosion/abscess associated with these teeth, sparing the fourth tooth. No acute soft tissue findings to suggest subperiosteal abscess. No evidence of floor of mouth inflammation or airway compromise. No sinusitis, bone lesion, or incidental intracranial finding. IMPRESSION: Multiple dental cavities and periapical erosions as numbered above. No visible soft tissue  inflammation. Electronically Signed   By: Marnee SpringJonathon  Watts M.D.   On: 11/06/2015 13:28   I have personally reviewed and evaluated these images and lab results as part of my medical decision-making.   EKG Interpretation None     Radiology results reviewed and shared with patient. Multiple teeth with caries and periapical erosion and abscess. No trismus. Has recently completed penicillin.  Will transition to clindamycin. Anti-inflammatory.  MDM   Final diagnoses:  None    Dental pain. Will need to follow-up with dentistry. Care instructions provided. Return precautions discussed.    Felicie Mornavid Faige Seely, NP 11/06/15 [phone]  Linwood DibblesJon Knapp, MD 11/09/15 [phone]

## 2016-01-15 ENCOUNTER — Encounter (HOSPITAL_BASED_OUTPATIENT_CLINIC_OR_DEPARTMENT_OTHER): Payer: Self-pay

## 2016-01-15 ENCOUNTER — Emergency Department (HOSPITAL_BASED_OUTPATIENT_CLINIC_OR_DEPARTMENT_OTHER)
Admission: EM | Admit: 2016-01-15 | Discharge: 2016-01-15 | Disposition: A | Discharge status: Home / Self Care | Payer: Medicaid - Out of State | Attending: Emergency Medicine | Admitting: Emergency Medicine

## 2016-01-15 DIAGNOSIS — R112 Nausea with vomiting, unspecified: Secondary | ICD-10-CM | POA: Insufficient documentation

## 2016-01-15 DIAGNOSIS — R6883 Chills (without fever): Secondary | ICD-10-CM | POA: Insufficient documentation

## 2016-01-15 DIAGNOSIS — R5383 Other fatigue: Secondary | ICD-10-CM | POA: Insufficient documentation

## 2016-01-15 DIAGNOSIS — F17210 Nicotine dependence, cigarettes, uncomplicated: Secondary | ICD-10-CM | POA: Insufficient documentation

## 2016-01-15 DIAGNOSIS — R1010 Upper abdominal pain, unspecified: Secondary | ICD-10-CM | POA: Insufficient documentation

## 2016-01-15 DIAGNOSIS — R197 Diarrhea, unspecified: Secondary | ICD-10-CM | POA: Insufficient documentation

## 2016-01-15 MED ORDER — ONDANSETRON 4 MG PO TBDP: 4.0000 mg | ORAL_TABLET | Freq: Three times a day (TID) | ORAL | Status: DC | PRN

## 2016-01-15 MED ORDER — SODIUM CHLORIDE 0.9 % IV BOLUS (SEPSIS)
1000.0000 mL | Freq: Once | INTRAVENOUS | Status: AC
  Administered 2016-01-15: 1000 mL via INTRAVENOUS

## 2016-01-15 MED ORDER — ONDANSETRON HCL 4 MG/2ML IJ SOLN
4.0000 mg | Freq: Once | INTRAMUSCULAR | Status: AC
  Administered 2016-01-15: 4 mg via INTRAVENOUS
  Filled 2016-01-15: qty 2

## 2016-01-15 NOTE — ED Notes (Signed)
Pt unable to void at this time. 

## 2016-01-15 NOTE — ED Provider Notes (Signed)
CSN: [id number]     Arrival date & time 01/15/16  1212 History   First MD Initiated Contact with Patient 01/15/16 1223     Chief Complaint  Patient presents with  . Abdominal Pain     Patient is a 30 y.o. female presenting with abdominal pain.  Abdominal Pain Associated symptoms: chills, diarrhea, fatigue, nausea and vomiting   Associated symptoms: no chest pain and no shortness of breath   Patient with nausea vomiting diarrhea. Also upper abdominal pain. Began yesterday. He states she's hungry but is in the sheets anything comes up. She's had some chills. States she does ache all over. She does feel really weak. No sick contacts. The pain is dull and constant. No blood in the emesis or diarrhea. Denies possibility of pregnancy.   Past Medical History  Diagnosis Date  . Seizures (HCC)     as child. last seizure when [age]yrs old   Past Surgical History  Procedure Laterality Date  . Adenoidectomy     No family history on file. Social History  Substance Use Topics  . Smoking status: Current Every Day Smoker -- 5 years    Types: Cigarettes  . Smokeless tobacco: Never Used  . Alcohol Use: Yes     Comment: socially   OB History    Gravida Para Term Preterm AB TAB SAB Ectopic Multiple Living   2 1 1  0 1 1 0 0 0 1     Review of Systems  Constitutional: Positive for chills and fatigue. Negative for activity change and appetite change.  Eyes: Negative for pain.  Respiratory: Negative for chest tightness and shortness of breath.   Cardiovascular: Negative for chest pain and leg swelling.  Gastrointestinal: Positive for nausea, vomiting, abdominal pain and diarrhea.  Genitourinary: Negative for flank pain and vaginal pain.  Musculoskeletal: Negative for back pain and neck stiffness.  Skin: Negative for rash.  Neurological: Negative for weakness, numbness and headaches.  Psychiatric/Behavioral: Negative for behavioral problems.      Allergies  Peanut-containing drug products and  Shellfish allergy  Home Medications   Prior to Admission medications   Medication Sig Start Date End Date Taking? Authorizing Provider  ondansetron (ZOFRAN-ODT) 4 MG disintegrating tablet Take 1 tablet (4 mg total) by mouth every 8 (eight) hours as needed for nausea or vomiting. 01/15/16   Benjiman CoreNathan Cydni Reddoch, MD   BP 108/77 mmHg  Pulse 96  Temp(Src) 98.7 F (37.1 C) (Oral)  Resp 16  Ht 5\' 4"  (1.626 m)  Wt 140 lb (63.504 kg)  BMI 24.02 kg/m2  SpO2 100%  LMP 12/28/2015 Physical Exam  Constitutional: She appears well-developed.  HENT:  Head: Atraumatic.  Eyes: Pupils are equal, round, and reactive to light.  Cardiovascular: Normal rate.   Pulmonary/Chest: Effort normal.  Abdominal: Soft.  Mild upper abdominal tenderness without rebound or guarding.  Musculoskeletal: Normal range of motion.  Neurological: She is alert.  Skin: Skin is warm.    ED Course  Procedures (including critical care time) Labs Review Labs Reviewed  URINALYSIS, ROUTINE W REFLEX MICROSCOPIC (NOT AT [facility])  PREGNANCY, URINE    Imaging Review No results found. I have personally reviewed and evaluated these images and lab results as part of my medical decision-making.   EKG Interpretation None      MDM   Final diagnoses:  Nausea vomiting and diarrhea  Patient nausea vomiting diarrhea. Mild upper abdominal tenderness. Feels much better after IV fluids and Zofran. Denies possibility of pregnancy and has tolerated  orals here. Will discharge home.  Benjiman Core, MD 01/15/16 1340

## 2016-01-15 NOTE — Discharge Instructions (Signed)

## 2016-01-15 NOTE — ED Notes (Signed)
C/o abd pain-n,v,d sinceyesterday-NAD-steady gait

## 2018-07-24 ENCOUNTER — Encounter (HOSPITAL_BASED_OUTPATIENT_CLINIC_OR_DEPARTMENT_OTHER): Payer: Self-pay

## 2018-07-24 ENCOUNTER — Other Ambulatory Visit: Payer: Self-pay

## 2018-07-24 ENCOUNTER — Emergency Department (HOSPITAL_BASED_OUTPATIENT_CLINIC_OR_DEPARTMENT_OTHER)
Admission: EM | Admit: 2018-07-24 | Discharge: 2018-07-24 | Disposition: A | Discharge status: Home / Self Care | Payer: Medicaid - Out of State | Attending: Emergency Medicine | Admitting: Emergency Medicine

## 2018-07-24 DIAGNOSIS — F1193 Opioid use, unspecified with withdrawal: Secondary | ICD-10-CM

## 2018-07-24 DIAGNOSIS — F17210 Nicotine dependence, cigarettes, uncomplicated: Secondary | ICD-10-CM | POA: Insufficient documentation

## 2018-07-24 DIAGNOSIS — Z91010 Allergy to peanuts: Secondary | ICD-10-CM | POA: Insufficient documentation

## 2018-07-24 DIAGNOSIS — F1123 Opioid dependence with withdrawal: Secondary | ICD-10-CM | POA: Insufficient documentation

## 2018-07-24 MED ORDER — LOPERAMIDE HCL 2 MG PO CAPS: 2.0000 mg | ORAL_CAPSULE | Freq: Four times a day (QID) | ORAL | 0 refills | Status: DC | PRN

## 2018-07-24 MED ORDER — ONDANSETRON 4 MG PO TBDP: 4.0000 mg | ORAL_TABLET | ORAL | 0 refills | Status: DC | PRN

## 2018-07-24 MED ORDER — CLONIDINE HCL 0.1 MG PO TABS: ORAL_TABLET | ORAL | 0 refills | Status: DC

## 2018-07-24 NOTE — ED Provider Notes (Signed)
[facility] EMERGENCY DEPARTMENT Provider Note   CSN: [id number] Arrival date & time: 07/24/18  1547     History   Chief Complaint Chief Complaint  Patient presents with  . Withdrawal    HPI Kathy Bird is a 32 y.o. female.  HPI Patient reports she has a long history of taking Vicodin and tramadol for chronic pain.  She reports that a friend of hers died from overdose recently and she wants to quit taking them.  Reports she is going to do it "cold [place]".  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  She reports that she has started to have some abdominal cramping and diarrhea.  He has some nausea but no vomiting.  She reports that she is mostly here because she needs a work excuse.  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 Past Medical History:  Diagnosis Date  . Seizures (HCC)    as child. last seizure when 26yrs old    There are no active problems to display for this patient.   Past Surgical History:  Procedure Laterality Date  . ADENOIDECTOMY       OB History    Gravida  2   Para  1   Term  1   Preterm  0   AB  1   Living  1     SAB  0   TAB  1   Ectopic  0   Multiple  0   Live Births  1            Home Medications    Prior to Admission medications   Medication Sig Start Date End Date Taking? Authorizing Provider  cloNIDine (CATAPRES) 0.1 MG tablet 1 tab po tid x 2 days, then bid x 2 days, then once daily x 2 days 07/24/18   Arby Barrette, MD  loperamide (IMODIUM) 2 MG capsule Take 1 capsule (2 mg total) by mouth 4 (four) times daily as needed for diarrhea or loose stools. 07/24/18   Arby Barrette, MD  ondansetron (ZOFRAN ODT) 4 MG disintegrating tablet Take 1 tablet (4 mg total) by mouth  every 4 (four) hours as needed for nausea or vomiting. 07/24/18   Arby Barrette, MD  ondansetron (ZOFRAN-ODT) 4 MG disintegrating tablet Take 1 tablet (4 mg total) by mouth every 8 (eight) hours as needed for nausea or vomiting. 01/15/16   Benjiman Core, MD    Family History No family history on file.  Social History Social History   Tobacco Use  . Smoking status: Current Every Day Smoker    Years: 5.00    Types: Cigarettes  . Smokeless tobacco: Never Used  Substance Use Topics  . Alcohol use: Yes    Comment: socially  . Drug use: No     Allergies   Peanut-containing drug products and Shellfish allergy   Review of Systems Review of Systems 10 Systems reviewed and are negative for acute change except as noted in the HPI.   Physical Exam Updated Vital Signs BP (!) 151/98 (BP Location: Left Arm)   Pulse 100   Temp 98.5 F (36.9 C) (Oral)   Resp 18   LMP 07/24/2018  SpO2 100%   Physical Exam  Constitutional: She is oriented to person, place, and time. She appears well-developed and well-nourished.  HENT:  Head: Normocephalic and atraumatic.  Eyes: Pupils are equal, round, and reactive to light. EOM are normal.  Neck: Neck supple.  Cardiovascular: Normal rate, regular rhythm, normal heart sounds and intact distal pulses.  Pulmonary/Chest: Effort normal and breath sounds normal.  Abdominal: Soft. Bowel sounds are normal. She exhibits no distension. There is no tenderness.  Musculoskeletal: Normal range of motion. She exhibits no edema.  Neurological: She is alert and oriented to person, place, and time. She has normal strength. Coordination normal. GCS eye subscore is 4. GCS verbal subscore is 5. GCS motor subscore is 6.  Skin: Skin is warm, dry and intact.  Psychiatric: She has a normal mood and affect.     ED Treatments / Results  Labs (all labs ordered are listed, but only abnormal results are displayed) Labs Reviewed - No data to  display  EKG None  Radiology No results found.  Procedures Procedures (including critical care time)  Medications Ordered in ED Medications - No data to display   Initial Impression / Assessment and Plan / ED Course  I have reviewed the triage vital signs and the nursing notes.  Pertinent labs & imaging results that were available during my care of the patient were reviewed by me and considered in my medical decision making (see chart for details).      Final Clinical Impressions(s) / ED Diagnoses   Final diagnoses:  Narcotic withdrawal (HCC)   Patient is clinically well in appearance.  She has normal mental status.  No respiratory distress.  No abdominal pain.  She reports she is discontinuing taking Vicodin and wishes paperwork for outpatient treatment programs.  Patient is given clonidine for opioid withdrawal, Zofran and Imodium for GI symptoms.  She reports she has had nausea and difficulty eating with frequent diarrhea.  Patient does not appear clinically dehydrated at this point time she is counseled on continuing fluid intake and very small amounts of bland food.  Turn precautions reviewed. ED Discharge Orders         Ordered    cloNIDine (CATAPRES) 0.1 MG tablet     07/24/18 1639    ondansetron (ZOFRAN ODT) 4 MG disintegrating tablet  Every 4 hours PRN     07/24/18 1639    loperamide (IMODIUM) 2 MG capsule  4 times daily PRN,   Status:  Discontinued     07/24/18 1639    loperamide (IMODIUM) 2 MG capsule  4 times daily PRN,   Status:  Discontinued     07/24/18 1640    loperamide (IMODIUM) 2 MG capsule  4 times daily PRN     07/24/18 1649           Arby BarrettePfeiffer, Ciani Rutten, MD 07/24/18 1657

## 2018-07-24 NOTE — ED Triage Notes (Signed)
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

## 2018-07-24 NOTE — ED Notes (Signed)
ED Provider at bedside. 

## 2019-04-21 ENCOUNTER — Other Ambulatory Visit: Payer: Self-pay

## 2019-04-21 ENCOUNTER — Encounter (HOSPITAL_COMMUNITY): Payer: Self-pay | Admitting: Emergency Medicine

## 2019-04-21 ENCOUNTER — Encounter (HOSPITAL_COMMUNITY): Payer: Self-pay

## 2019-04-21 ENCOUNTER — Emergency Department (HOSPITAL_COMMUNITY)
Admission: EM | Admit: 2019-04-21 | Discharge: 2019-04-21 | Disposition: A | Discharge status: Other Acute Inpatient Hospital | Payer: Medicaid Other | Attending: Emergency Medicine | Admitting: Emergency Medicine

## 2019-04-21 ENCOUNTER — Inpatient Hospital Stay (HOSPITAL_COMMUNITY)
Admission: AD | Admit: 2019-04-21 | Discharge: 2019-04-24 | DRG: 885 | Disposition: A | Discharge status: Home / Self Care | Payer: Federal, State, Local not specified - Other | Attending: Psychiatry | Admitting: Psychiatry

## 2019-04-21 DIAGNOSIS — F17210 Nicotine dependence, cigarettes, uncomplicated: Secondary | ICD-10-CM | POA: Diagnosis present

## 2019-04-21 DIAGNOSIS — Z91010 Allergy to peanuts: Secondary | ICD-10-CM | POA: Diagnosis not present

## 2019-04-21 DIAGNOSIS — Z91013 Allergy to seafood: Secondary | ICD-10-CM | POA: Diagnosis not present

## 2019-04-21 DIAGNOSIS — Y999 Unspecified external cause status: Secondary | ICD-10-CM | POA: Insufficient documentation

## 2019-04-21 DIAGNOSIS — T404X2A Poisoning by other synthetic narcotics, intentional self-harm, initial encounter: Secondary | ICD-10-CM | POA: Diagnosis not present

## 2019-04-21 DIAGNOSIS — F1020 Alcohol dependence, uncomplicated: Secondary | ICD-10-CM | POA: Diagnosis present

## 2019-04-21 DIAGNOSIS — F332 Major depressive disorder, recurrent severe without psychotic features: Secondary | ICD-10-CM | POA: Diagnosis present

## 2019-04-21 DIAGNOSIS — T43642A Poisoning by ecstasy, intentional self-harm, initial encounter: Secondary | ICD-10-CM | POA: Insufficient documentation

## 2019-04-21 DIAGNOSIS — R45850 Homicidal ideations: Secondary | ICD-10-CM | POA: Insufficient documentation

## 2019-04-21 DIAGNOSIS — X838XXA Intentional self-harm by other specified means, initial encounter: Secondary | ICD-10-CM | POA: Diagnosis not present

## 2019-04-21 DIAGNOSIS — F410 Panic disorder [episodic paroxysmal anxiety] without agoraphobia: Secondary | ICD-10-CM | POA: Diagnosis present

## 2019-04-21 DIAGNOSIS — F1110 Opioid abuse, uncomplicated: Secondary | ICD-10-CM | POA: Diagnosis present

## 2019-04-21 DIAGNOSIS — Z23 Encounter for immunization: Secondary | ICD-10-CM

## 2019-04-21 DIAGNOSIS — Z818 Family history of other mental and behavioral disorders: Secondary | ICD-10-CM

## 2019-04-21 DIAGNOSIS — T402X2A Poisoning by other opioids, intentional self-harm, initial encounter: Secondary | ICD-10-CM | POA: Diagnosis not present

## 2019-04-21 DIAGNOSIS — R45851 Suicidal ideations: Secondary | ICD-10-CM | POA: Diagnosis not present

## 2019-04-21 DIAGNOSIS — T481X2A Poisoning by skeletal muscle relaxants [neuromuscular blocking agents], intentional self-harm, initial encounter: Secondary | ICD-10-CM | POA: Diagnosis not present

## 2019-04-21 DIAGNOSIS — Z20828 Contact with and (suspected) exposure to other viral communicable diseases: Secondary | ICD-10-CM | POA: Diagnosis present

## 2019-04-21 DIAGNOSIS — Y929 Unspecified place or not applicable: Secondary | ICD-10-CM | POA: Diagnosis not present

## 2019-04-21 DIAGNOSIS — T1491XA Suicide attempt, initial encounter: Secondary | ICD-10-CM

## 2019-04-21 DIAGNOSIS — Y9389 Activity, other specified: Secondary | ICD-10-CM | POA: Diagnosis not present

## 2019-04-21 DIAGNOSIS — T450X2A Poisoning by antiallergic and antiemetic drugs, intentional self-harm, initial encounter: Secondary | ICD-10-CM | POA: Insufficient documentation

## 2019-04-21 DIAGNOSIS — T50902A Poisoning by unspecified drugs, medicaments and biological substances, intentional self-harm, initial encounter: Secondary | ICD-10-CM

## 2019-04-21 DIAGNOSIS — G4700 Insomnia, unspecified: Secondary | ICD-10-CM | POA: Diagnosis present

## 2019-04-21 LAB — CBC WITH DIFFERENTIAL/PLATELET
Abs Immature Granulocytes: 0.01 10*3/uL (ref 0.00–0.07)
Basophils Absolute: 0 10*3/uL (ref 0.0–0.1)
Basophils Relative: 0 %
Eosinophils Absolute: 0.1 10*3/uL (ref 0.0–0.5)
Eosinophils Relative: 3 %
HCT: 32.3 % — ABNORMAL LOW (ref 36.0–46.0)
Hemoglobin: 9.3 g/dL — ABNORMAL LOW (ref 12.0–15.0)
Immature Granulocytes: 0 %
Lymphocytes Relative: 34 %
Lymphs Abs: 1.2 10*3/uL (ref 0.7–4.0)
MCH: 22.5 pg — ABNORMAL LOW (ref 26.0–34.0)
MCHC: 28.8 g/dL — ABNORMAL LOW (ref 30.0–36.0)
MCV: 78 fL — ABNORMAL LOW (ref 80.0–100.0)
Monocytes Absolute: 0.3 10*3/uL (ref 0.1–1.0)
Monocytes Relative: 9 %
Neutro Abs: 1.9 10*3/uL (ref 1.7–7.7)
Neutrophils Relative %: 54 %
Platelets: 340 10*3/uL (ref 150–400)
RBC: 4.14 MIL/uL (ref 3.87–5.11)
RDW: 17.9 % — ABNORMAL HIGH (ref 11.5–15.5)
WBC: 3.5 10*3/uL — ABNORMAL LOW (ref 4.0–10.5)
nRBC: 0 % (ref 0.0–0.2)

## 2019-04-21 LAB — RAPID URINE DRUG SCREEN, HOSP PERFORMED
Amphetamines: POSITIVE — AB
Barbiturates: NOT DETECTED
Benzodiazepines: NOT DETECTED
Cocaine: NOT DETECTED
Opiates: NOT DETECTED
Tetrahydrocannabinol: NOT DETECTED

## 2019-04-21 LAB — COMPREHENSIVE METABOLIC PANEL
ALT: 26 U/L (ref 0–44)
AST: 24 U/L (ref 15–41)
Albumin: 3.8 g/dL (ref 3.5–5.0)
Alkaline Phosphatase: 40 U/L (ref 38–126)
Anion gap: 14 (ref 5–15)
BUN: 8 mg/dL (ref 6–20)
CO2: 21 mmol/L — ABNORMAL LOW (ref 22–32)
Calcium: 9.2 mg/dL (ref 8.9–10.3)
Chloride: 104 mmol/L (ref 98–111)
Creatinine, Ser: 0.64 mg/dL (ref 0.44–1.00)
GFR calc Af Amer: >60 mL/min (ref >60)
GFR calc non Af Amer: >60 mL/min (ref >60)
Glucose, Bld: 124 mg/dL — ABNORMAL HIGH (ref 70–99)
Potassium: 3.1 mmol/L — ABNORMAL LOW (ref 3.5–5.1)
Sodium: 139 mmol/L (ref 135–145)
Total Bilirubin: 0.4 mg/dL (ref 0.3–1.2)
Total Protein: 7.4 g/dL (ref 6.5–8.1)

## 2019-04-21 LAB — LACTIC ACID, PLASMA
Lactic Acid, Venous: 3.4 mmol/L (ref 0.5–1.9)
Lactic Acid, Venous: 0.9 mmol/L (ref 0.5–1.9)

## 2019-04-21 LAB — ACETAMINOPHEN LEVEL
Acetaminophen (Tylenol), Serum: <10 ug/mL — ABNORMAL LOW (ref 10–30)
Acetaminophen (Tylenol), Serum: <10 ug/mL — ABNORMAL LOW (ref 10–30)

## 2019-04-21 LAB — ETHANOL: Alcohol, Ethyl (B): 94 mg/dL — ABNORMAL HIGH (ref <10)

## 2019-04-21 LAB — SALICYLATE LEVEL: Salicylate Lvl: <7 mg/dL (ref 2.8–30.0)

## 2019-04-21 LAB — MAGNESIUM: Magnesium: 2 mg/dL (ref 1.7–2.4)

## 2019-04-21 LAB — I-STAT BETA HCG BLOOD, ED (MC, WL, AP ONLY): I-stat hCG, quantitative: <5 m[IU]/mL (ref <5)

## 2019-04-21 LAB — SARS CORONAVIRUS 2 BY RT PCR (HOSPITAL ORDER, PERFORMED IN ~~LOC~~ HOSPITAL LAB): SARS Coronavirus 2: NEGATIVE

## 2019-04-21 MED ORDER — LOPERAMIDE HCL 2 MG PO CAPS: 2.0000 mg | ORAL_CAPSULE | ORAL | Status: DC | PRN

## 2019-04-21 MED ORDER — CLONIDINE HCL 0.1 MG PO TABS
0.1000 mg | ORAL_TABLET | Freq: Four times a day (QID) | ORAL | Status: AC
  Administered 2019-04-21 – 2019-04-23 (×8): 0.1 mg via ORAL
  Filled 2019-04-21 (×12): qty 1

## 2019-04-21 MED ORDER — ALUM & MAG HYDROXIDE-SIMETH 200-200-20 MG/5ML PO SUSP: 30.0000 mL | ORAL | Status: DC | PRN

## 2019-04-21 MED ORDER — PNEUMOCOCCAL VAC POLYVALENT 25 MCG/0.5ML IJ INJ: 0.5000 mL | INJECTION | INTRAMUSCULAR | Status: DC

## 2019-04-21 MED ORDER — SODIUM CHLORIDE 0.9 % IV BOLUS
1000.0000 mL | Freq: Once | INTRAVENOUS | Status: AC
  Administered 2019-04-21: 1000 mL via INTRAVENOUS

## 2019-04-21 MED ORDER — CLONIDINE HCL 0.1 MG PO TABS
0.1000 mg | ORAL_TABLET | ORAL | Status: DC
  Administered 2019-04-24: 0.1 mg via ORAL
  Filled 2019-04-21 (×4): qty 1

## 2019-04-21 MED ORDER — ZIPRASIDONE MESYLATE 20 MG IM SOLR: 20.0000 mg | Freq: Four times a day (QID) | INTRAMUSCULAR | Status: DC | PRN

## 2019-04-21 MED ORDER — CLONIDINE HCL 0.1 MG PO TABS
0.1000 mg | ORAL_TABLET | Freq: Every day | ORAL | Status: DC
  Filled 2019-04-21: qty 1

## 2019-04-21 MED ORDER — ONDANSETRON 4 MG PO TBDP: 4.0000 mg | ORAL_TABLET | Freq: Four times a day (QID) | ORAL | Status: DC | PRN

## 2019-04-21 MED ORDER — HYDROXYZINE HCL 25 MG PO TABS: 25.0000 mg | ORAL_TABLET | Freq: Four times a day (QID) | ORAL | Status: DC | PRN

## 2019-04-21 MED ORDER — ONDANSETRON HCL 4 MG/2ML IJ SOLN
4.0000 mg | Freq: Once | INTRAMUSCULAR | Status: AC
  Administered 2019-04-21: 4 mg via INTRAVENOUS
  Filled 2019-04-21: qty 2

## 2019-04-21 MED ORDER — TRAZODONE HCL 50 MG PO TABS
50.0000 mg | ORAL_TABLET | Freq: Every evening | ORAL | Status: DC | PRN
  Administered 2019-04-22 – 2019-04-23 (×2): 50 mg via ORAL
  Filled 2019-04-21 (×2): qty 1

## 2019-04-21 MED ORDER — POTASSIUM CHLORIDE CRYS ER 20 MEQ PO TBCR
20.0000 meq | EXTENDED_RELEASE_TABLET | Freq: Once | ORAL | Status: AC
  Administered 2019-04-21: 20 meq via ORAL
  Filled 2019-04-21 (×2): qty 1

## 2019-04-21 MED ORDER — VITAMIN B-1 100 MG PO TABS
100.0000 mg | ORAL_TABLET | Freq: Every day | ORAL | Status: DC
  Administered 2019-04-21 – 2019-04-24 (×4): 100 mg via ORAL
  Filled 2019-04-21 (×8): qty 1

## 2019-04-21 MED ORDER — POTASSIUM CHLORIDE CRYS ER 20 MEQ PO TBCR
40.0000 meq | EXTENDED_RELEASE_TABLET | Freq: Once | ORAL | Status: AC
  Administered 2019-04-21: 40 meq via ORAL
  Filled 2019-04-21: qty 2

## 2019-04-21 MED ORDER — DICYCLOMINE HCL 20 MG PO TABS: 20.0000 mg | ORAL_TABLET | Freq: Four times a day (QID) | ORAL | Status: DC | PRN

## 2019-04-21 MED ORDER — NAPROXEN 500 MG PO TABS
500.0000 mg | ORAL_TABLET | Freq: Two times a day (BID) | ORAL | Status: DC | PRN
  Administered 2019-04-22: 500 mg via ORAL
  Filled 2019-04-21 (×2): qty 1

## 2019-04-21 MED ORDER — MAGNESIUM HYDROXIDE 400 MG/5ML PO SUSP: 30.0000 mL | Freq: Every day | ORAL | Status: DC | PRN

## 2019-04-21 MED ORDER — FOLIC ACID 1 MG PO TABS
1.0000 mg | ORAL_TABLET | Freq: Every day | ORAL | Status: DC
  Administered 2019-04-22 – 2019-04-24 (×3): 1 mg via ORAL
  Filled 2019-04-21 (×6): qty 1

## 2019-04-21 MED ORDER — METHOCARBAMOL 500 MG PO TABS
500.0000 mg | ORAL_TABLET | Freq: Three times a day (TID) | ORAL | Status: DC | PRN
  Administered 2019-04-22 – 2019-04-23 (×2): 500 mg via ORAL
  Filled 2019-04-21 (×3): qty 1

## 2019-04-21 MED ORDER — ACETAMINOPHEN 325 MG PO TABS
650.0000 mg | ORAL_TABLET | Freq: Four times a day (QID) | ORAL | Status: DC | PRN
  Administered 2019-04-21: 650 mg via ORAL
  Filled 2019-04-21: qty 2

## 2019-04-21 MED ORDER — CHLORDIAZEPOXIDE HCL 25 MG PO CAPS
25.0000 mg | ORAL_CAPSULE | Freq: Four times a day (QID) | ORAL | Status: DC | PRN
  Administered 2019-04-21: 25 mg via ORAL
  Filled 2019-04-21: qty 1

## 2019-04-21 MED ORDER — HYDROXYZINE HCL 25 MG PO TABS
25.0000 mg | ORAL_TABLET | Freq: Three times a day (TID) | ORAL | Status: DC | PRN
  Administered 2019-04-22 – 2019-04-23 (×2): 25 mg via ORAL
  Filled 2019-04-21: qty 1
  Filled 2019-04-21: qty 10
  Filled 2019-04-21: qty 1

## 2019-04-21 NOTE — ED Notes (Signed)
Pt sts she feels sleepy, nauseas and dizzy. Emesis bag given. RN made aware

## 2019-04-21 NOTE — ED Notes (Signed)
[employer] called to transport pt over to [facility]

## 2019-04-21 NOTE — ED Notes (Signed)
TTS in progress 

## 2019-04-21 NOTE — ED Notes (Signed)
Pt wanded by security. 

## 2019-04-21 NOTE — ED Notes (Signed)
Patient verbalizes understanding of discharge instructions. Opportunity for questioning and answers were provided. Armband removed by staff, pt discharged from ED to [facility] with [employer].
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
## 2019-04-21 NOTE — ED Notes (Signed)
[employer] here to transport patient to [facility]. [employer] given IVC paperwork.

## 2019-04-21 NOTE — Progress Notes (Addendum)
D   Pt reports some withdrawal symptoms to include aches and pains all over and anxiety   She has been in the dayroom most of the shift   Her behavior is appropriate  She endorses depression A   Verbal support given   Medications administered and effectiveness monitored    Q 15 min checks R   Pt is safe at this time  [place] NOVEL CORONAVIRUS (COVID-19) DAILY CHECK-OFF SYMPTOMS - answer yes or no to each - every day NO YES  Have you had a fever in the past 24 hours?  . Fever (Temp > 37.80C / 100F) X   Have you had any of these symptoms in the past 24 hours? . New Cough .  Sore Throat  .  Shortness of Breath .  Difficulty Breathing .  Unexplained Body Aches   X   Have you had any one of these symptoms in the past 24 hours not related to allergies?   . Runny Nose .  Nasal Congestion .  Sneezing   X   If you have had runny nose, nasal congestion, sneezing in the past 24 hours, has it worsened?  X   EXPOSURES - check yes or no X   Have you traveled outside the state in the past 14 days?  X   Have you been in contact with someone with a confirmed diagnosis of COVID-19 or PUI in the past 14 days without wearing appropriate PPE?  X   Have you been living in the same home as a person with confirmed diagnosis of COVID-19 or a PUI (household contact)?    X   Have you been diagnosed with COVID-19?    X              What to do next: Answered NO to all: Answered YES to anything:   Proceed with unit schedule Follow the [facility] Inpatient Flowsheet.

## 2019-04-21 NOTE — ED Provider Notes (Signed)
Care assumed at shift change from [place], [place], pending re-evaluation for medical clearance and TTS evaluation. Pt is under IVC. See her note for full HPI and workup. Briefly, pt presenting after intentional overdose of 2 pills of ecstasy, 10 tramadol, 30 Benadryl, 10 unknown muscle relaxer, 1 bottle of brandy, half bottle of wine, and 2 Percocets.  Thought to be ingested yesterday evening.  Patient has been somnolent though arousable to verbal stimuli throughout ED stay.  Hemodynamically stable.  Initial labs reveal hypokalemia of 3.1, replaced orally.  Lactic acidosis is present.  Undetectable aspirin and Tylenol level.  Negative pregnancy.  Plan to monitor, once patient is more alert, can medically clear for TTS evaluation.  Physical Exam  BP (!) 123/92   Pulse 96   Temp 98.1 F (36.7 C) (Oral)   Resp 15   SpO2 100%   Physical Exam Vitals signs and nursing note reviewed.  Constitutional:      General: She is not in acute distress.    Appearance: She is well-developed.  HENT:     Head: Normocephalic and atraumatic.  Eyes:     Conjunctiva/sclera: Conjunctivae normal.  Cardiovascular:     Rate and Rhythm: Normal rate.  Pulmonary:     Effort: Pulmonary effort is normal.  Abdominal:     Palpations: Abdomen is soft.  Skin:    General: Skin is warm.  Neurological:     Mental Status: She is alert.  Psychiatric:        Behavior: Behavior normal.     ED Course/Procedures   Clinical Course as of Apr 20 848  Sun Apr 21, 2019  0218 I spoke with poison control.  Patient has taken 2 pills of ecstasy, 10 tramadol, 30 Benadryl, 10 unknown muscle relaxer, 1 bottle of E+J, half a bottle of wine and 2 Percocets over the course of the day.  They report that there is a possibility of seizures.  Recommend observing her until she is back at normal baseline.  Coingestion labs in addition to a lactic acid and a magnesium level.  Benzos if needed for seizures, IV fluids if needed for hypotension.   [EH]  0231 Patients mother Jones Broom says that she is in the process of being divorced.  She met someone and they had it go bad today.   Mom reports that she is bipolar, thinks she is manic right now.  Mom reports that she called her and said I am going to bring you an envelope and bring my child to stay with you.  She asked her what she has done, told mom took 4 ultram, 4 benadryl and 4 sleeping pills with wine.  Threw up.  Had seizures as a child.     [EH]  0422 Patient evaluated, she is sleeping in no distress.  She awakens to her name being called, is able to tell me her name and that she feels okay.   [EH]  [id number] Patient is awake and requesting to go home.  I had a conversation with patient, I discussed events that occurred overnight.  States she does not recall much.  Discussed with patient that she is currently under IVC and plan now is for psychiatric evaluation.  She verbalized understanding.  She is also reporting nausea and headache.   [JR]    Clinical Course User Index [EH] Lorin Glass, PA-C [JR] , [place] N, PA-C    Procedures  MDM  Repeat Tylenol level remains undetectable.  Repeat lactic acid is within  normal limits.  Patient is now alert and cooperative.  She is made aware of her current IVC.  She is agreeable to plan at this time.  Requested some medication for nausea, EKG and repeat EKG without evidence of QT prolongation.  Dosed with 1 dose of Zofran..  Patient is medically cleared for TTS evaluation and disposition.      , [place] N, PA-C 04/21/19 1249    Merryl Hacker, MD 04/23/19 [phone]

## 2019-04-21 NOTE — Tx Team (Signed)
Initial Treatment Plan 04/21/2019 4:23 PM Kathy Bird [id number]    PATIENT STRESSORS: Marital or family conflict Substance abuse   PATIENT STRENGTHS: Capable of independent living Communication skills Physical Health Supportive family/friends   PATIENT IDENTIFIED PROBLEMS:     "I want to be able to cope with day to day"     "hopelessness"     "just feel sad"         DISCHARGE CRITERIA:  Adequate post-discharge living arrangements Improved stabilization in mood, thinking, and/or behavior Reduction of life-threatening or endangering symptoms to within safe limits Verbal commitment to aftercare and medication compliance  PRELIMINARY DISCHARGE PLAN: Attend aftercare/continuing care group Outpatient therapy Return to previous living arrangement  PATIENT/FAMILY INVOLVEMENT: This treatment plan has been presented to and reviewed with the patient, Kathy Bird,  The patient and family has been given the opportunity to ask questions and make suggestions.  Waymond Cera, RN 04/21/2019, 4:23 PM

## 2019-04-21 NOTE — BH Assessment (Addendum)
Tele Assessment Note   Patient Name: Kathy Bird Kotarski MRN: [id number] Referring Physician:   Location of Patient: [facility] ED Location of Provider: [facility]  Kathy Bird Deyarmin is an 33 y.o. female.  The pt came in after overdosing on 2 pills of ecstasy, 10 tramadol, 30 Benadryl, 10 unknown muscle relaxer, 1 bottle of E+J, half a bottle of wine and 2 Percocets over the course of the day.  The pt stated she is having a hard time with her divorce.  Notes from the PA state the pt left her husband a month ago due to him having a child with another woman.  She denies any previous mental health treatment.  She stated she has been feeling this depressed for about 4 months and dealing with depression, since she was in high school.  The pt is also having thoughts of killing her husband.  She denies having a plan.  She doesn't have a history of violence or killing anyone.       The pt currently lives with her brother and child. She is working full time.  The pt denies self harm, legal issues, history of abuse and hallucinations.  She is working 2 hours a night and has a poor appetite.  She stated she feels depressed, has little interest in pleasurable things, feels bad about herself, has problems concentrating, increased crying spells and is more irritable.  The pt denies SA.  Her UDS is positive for amphetamines.  Pt is dressed in scrubs. She is alert and oriented x4. Pt speaks in a clear tone, at moderate volume and normal pace. Eye contact is good. Pt's mood is depressed. Thought process is coherent and relevant. There is no indication Pt is currently responding to internal stimuli or experiencing delusional thought content.?Pt was cooperative throughout assessment.     Diagnosis: F33.2 Major depressive disorder, Recurrent episode, Severe  Past Medical History:  Past Medical History:  Diagnosis Date  . Seizures (HCC)    as child. last seizure when [age]yrs old    Past Surgical History:  Procedure  Laterality Date  . ADENOIDECTOMY      Family History: No family history on file.  Social History:  reports that she has been smoking cigarettes. She has smoked for the past 5.00 years. She has never used smokeless tobacco. She reports current alcohol use. She reports that she does not use drugs.  Additional Social History:  Alcohol / Drug Use Pain Medications: See MAR Prescriptions: See MAR Over the Counter: See MAR History of alcohol / drug use?: No history of alcohol / drug abuse Longest period of sobriety (when/how long): NA  CIWA: CIWA-Ar BP: (!) 122/97 Pulse Rate: 99 COWS:    Allergies:  Allergies  Allergen Reactions  . Peanut-Containing Drug Products Hives  . Shellfish Allergy Hives, Itching and Swelling    Tongue swelling also    Home Medications: (Not in a hospital admission)   OB/GYN Status:  No LMP recorded.  General Assessment Data [facility] T[facility] Assessment: In system Is this a Tele or Face-to-Face Assessment?: Tele Assessment Is this an Initial Assessment or a Re-assessment for this encounter?: Initial Assessment Patient Accompanied by:: N/A Language Other than English: No Living Arrangements: Other (Comment)(house) What gender do you identify as?: Female Marital status: Separated Maiden name: Huhn Pregnancy Status: No Living Arrangements: Children, Other relatives Can pt return to current living arrangement?: Yes Admission Status: Involuntary Petitioner: ED Attending Is patient capable of signing voluntary admission?:  No(Pt IVC) Referral Source: Self/Family/Friend Insurance type: Self pay     Crisis Care Plan Living Arrangements: Children, Other relatives Legal Guardian: Other:(Self) Name of Psychiatrist: none Name of Therapist: none  Education Status Is patient currently in school?: No Is the patient employed, unemployed or receiving disability?: Employed  Risk to self with the past 6 months Suicidal Ideation:  Yes-Currently Present Has patient been a risk to self within the past 6 months prior to admission? : Yes Suicidal Intent: Yes-Currently Present Has patient had any suicidal intent within the past 6 months prior to admission? : Yes Is patient at risk for suicide?: Yes Suicidal Plan?: Yes-Currently Present Has patient had any suicidal plan within the past 6 months prior to admission? : Yes Specify Current Suicidal Plan: OD Access to Means: Yes Specify Access to Suicidal Means: pt has pills What has been your use of drugs/alcohol within the last 12 months?: none Previous Attempts/Gestures: No How many times?: 0 Other Self Harm Risks: none Triggers for Past Attempts: None known Intentional Self Injurious Behavior: None Family Suicide History: No Recent stressful life event(s): Divorce Persecutory voices/beliefs?: No Depression: Yes Depression Symptoms: Despondent, Insomnia, Tearfulness, Loss of interest in usual pleasures, Fatigue, Isolating, Feeling worthless/self pity, Feeling angry/irritable Substance abuse history and/or treatment for substance abuse?: No Suicide prevention information given to non-admitted patients: Not applicable  Risk to Others within the past 6 months Homicidal Ideation: Yes-Currently Present Does patient have any lifetime risk of violence toward others beyond the six months prior to admission? : No Thoughts of Harm to Others: Yes-Currently Present Comment - Thoughts of Harm to Others: wants to kill husband Current Homicidal Intent: No Current Homicidal Plan: No Access to Homicidal Means: No Identified Victim: pt's husband History of harm to others?: No Assessment of Violence: On admission Violent Behavior Description: none Does patient have access to weapons?: No Criminal Charges Pending?: No Does patient have a court date: No Is patient on probation?: No  Psychosis Hallucinations: None noted Delusions: None noted  Mental Status  Report Appearance/Hygiene: Unremarkable Eye Contact: Fair Motor Activity: Freedom of movement, Unremarkable Speech: Logical/coherent Level of Consciousness: Alert Mood: Depressed Affect: Depressed Anxiety Level: None Thought Processes: Coherent, Relevant Judgement: Impaired Orientation: Person, Place, Time, Situation Obsessive Compulsive Thoughts/Behaviors: None  Cognitive Functioning Concentration: Normal Memory: Recent Intact, Remote Intact Is patient IDD: No Insight: Poor Impulse Control: Poor Appetite: Poor Have you had any weight changes? : Loss Amount of the weight change? (lbs): (unknown, but pt notices chang in how clothes fit her) Sleep: Decreased Total Hours of Sleep: 2 Vegetative Symptoms: None  ADLScreening [facility] Assessment Services) Patient's cognitive ability adequate to safely complete daily activities?: Yes Patient able to express need for assistance with ADLs?: Yes Independently performs ADLs?: Yes (appropriate for developmental age)  Prior Inpatient Therapy Prior Inpatient Therapy: No  Prior Outpatient Therapy Prior Outpatient Therapy: No Does patient have an ACCT team?: No Does patient have Intensive In-House Services?  : No Does patient have [facility] services? : No Does patient have P4CC services?: No  ADL Screening (condition at time of admission) Patient's cognitive ability adequate to safely complete daily activities?: Yes Patient able to express need for assistance with ADLs?: Yes Independently performs ADLs?: Yes (appropriate for developmental age)       Abuse/Neglect Assessment (Assessment to be complete while patient is alone) Abuse/Neglect Assessment Can Be Completed: Yes Physical Abuse: Denies Verbal Abuse: Denies Sexual Abuse: Denies Exploitation of patient/patient's resources: Denies Self-Neglect: Denies Values / Beliefs Cultural Requests  During Hospitalization: None Spiritual Requests During Hospitalization:  None Consults Spiritual Care Consult Needed: No Social Work Consult Needed: No            Disposition:  Disposition Initial Assessment Completed for this Encounter: Yes  NP T. Money recommends inpatient treatment and the pt is to be [id number].   This service was provided via telemedicine using a 2-way, interactive audio and video technology.  Names of all persons participating in this telemedicine service and their role in this encounter. Name: Lenn SinkDebra Yamin Role: pt  Name:  Role:   Name:  Role:   Name:  Role:     Ottis StainGarvin, Lynwood Kubisiak Jermaine 04/21/2019 9:59 AM

## 2019-04-21 NOTE — ED Notes (Signed)
Mom, tammie- [phone] would like updates

## 2019-04-21 NOTE — ED Notes (Signed)
Pts IVC paperwork faxed to [facility].  

## 2019-04-21 NOTE — ED Notes (Signed)
Patient ambulated to the bathroom with minimal assist

## 2019-04-21 NOTE — ED Notes (Signed)
Pt changed into purple scrubs 

## 2019-04-21 NOTE — ED Triage Notes (Signed)
Patient here from home after calling mother and stating she took 4 Tramadol, 4 sleeping pills that are an unknown substance and some benadryl pills.  She also has some ETOH ( 1/2 bottle of wine) on board.  She kept saying to EMS "it is my choice".  Mother called EMS to transport to ED.

## 2019-04-21 NOTE — ED Notes (Signed)
Pt food tray arrived. Pt not eating at the moment.

## 2019-04-21 NOTE — ED Notes (Signed)
ED TO INPATIENT HANDOFF REPORT  ED Nurse Name and Phone #: Roby LoftsScott  S Name/Age/Gender Langley Adieebra L Paccione 33 y.o. female Room/Bed: 017C/017C  Code Status   Code Status: Not on file  Home/SNF/Other Home Patient oriented to: self, place, time and situation Is this baseline? Yes   Triage Complete: Triage complete  Chief Complaint Overdose  Triage Note Patient here from home after calling mother and stating she took 4 Tramadol, 4 sleeping pills that are an unknown substance and some benadryl pills.  She also has some ETOH ( 1/2 bottle of wine) on board.  She kept saying to EMS "it is my choice".  Mother called EMS to transport to ED.     Allergies Allergies  Allergen Reactions  . Peanut-Containing Drug Products Hives  . Shellfish Allergy Hives, Itching and Swelling    Tongue swelling also    Level of Care/Admitting Diagnosis ED Disposition    None      B Medical/Surgery History Past Medical History:  Diagnosis Date  . Seizures (HCC)    as child. last seizure when [age]yrs old   Past Surgical History:  Procedure Laterality Date  . ADENOIDECTOMY       A IV Location/Drains/Wounds Patient Lines/Drains/Airways Status   Active Line/Drains/Airways    Name:   Placement date:   Placement time:   Site:   Days:   Peripheral IV 04/21/19 Right Wrist   04/21/19    0254    Wrist   less than 1          Intake/Output Last 24 hours  Intake/Output Summary (Last 24 hours) at 04/21/2019 1112 Last data filed at 04/21/2019 0557 Gross per 24 hour  Intake 1000 ml  Output -  Net 1000 ml    Labs/Imaging Results for orders placed or performed during the hospital encounter of 04/21/19 (from the past 48 hour(s))  Comprehensive metabolic panel     Status: Abnormal   Collection Time: 04/21/19  1:40 AM  Result Value Ref Range   Sodium 139 135 - 145 mmol/L   Potassium 3.1 (L) 3.5 - 5.1 mmol/L   Chloride 104 98 - 111 mmol/L   CO2 21 (L) 22 - 32 mmol/L   Glucose, Bld 124 (H) 70 - 99 mg/dL    BUN 8 6 - 20 mg/dL   Creatinine, Ser 6.640.64 0.44 - 1.00 mg/dL   Calcium 9.2 8.9 - 40.310.3 mg/dL   Total Protein 7.4 6.5 - 8.1 g/dL   Albumin 3.8 3.5 - 5.0 g/dL   AST 24 15 - 41 U/L   ALT 26 0 - 44 U/L   Alkaline Phosphatase 40 38 - 126 U/L   Total Bilirubin 0.4 0.3 - 1.2 mg/dL   GFR calc non Af Amer >60 >60 mL/min   GFR calc Af Amer >60 >60 mL/min   Anion gap 14 5 - 15    Comment: Performed at [facility], [address] [place], [place] [address]  Ethanol     Status: Abnormal   Collection Time: 04/21/19  1:40 AM  Result Value Ref Range   Alcohol, Ethyl (B) 94 (H) <10 mg/dL    Comment: (NOTE) Lowest detectable limit for serum alcohol is 10 mg/dL. For medical purposes only. Performed at [facility], [address] [place], [place] [address]   Acetaminophen level     Status: Abnormal   Collection Time: 04/21/19  1:40 AM  Result Value Ref Range   Acetaminophen (Tylenol), Serum <10 (L)  10 - 30 ug/mL    Comment: Performed at [facility], [address] [place], [place] [address]  Salicylate level     Status: None   Collection Time: 04/21/19  1:40 AM  Result Value Ref Range   Salicylate Lvl <7.0 2.8 - 30.0 mg/dL    Comment: Performed at [facility], [address] [place], [place] [address]  CBC with Differential     Status: Abnormal   Collection Time: 04/21/19  1:40 AM  Result Value Ref Range   WBC 3.5 (L) 4.0 - 10.5 K/uL   RBC 4.14 3.87 - 5.11 MIL/uL   Hemoglobin 9.3 (L) 12.0 - 15.0 g/dL   HCT 30.832.3 (L) 65.736.0 - 84.646.0 %   MCV 78.0 (L) 80.0 - 100.0 fL   MCH 22.5 (L) 26.0 - 34.0 pg   MCHC 28.8 (L) 30.0 - 36.0 g/dL   RDW 96.217.9 (H) 95.211.5 - 84.115.5 %   Platelets 340 150 - 400 K/uL   nRBC 0.0 0.0 - 0.2 %   Neutrophils Relative % 54 %   Neutro Abs 1.9 1.7 - 7.7 K/uL   Lymphocytes Relative 34 %   Lymphs Abs 1.2 0.7 - 4.0 K/uL   Monocytes Relative 9 %   Monocytes Absolute 0.3 0.1 - 1.0 K/uL   Eosinophils Relative 3 %   Eosinophils Absolute 0.1 0.0 - 0.5  K/uL   Basophils Relative 0 %   Basophils Absolute 0.0 0.0 - 0.1 K/uL   Immature Granulocytes 0 %   Abs Immature Granulocytes 0.01 0.00 - 0.07 K/uL    Comment: Performed at [facility], [address] [place], [place] [address]  Lactic acid, plasma     Status: Abnormal   Collection Time: 04/21/19  2:24 AM  Result Value Ref Range   Lactic Acid, Venous 3.4 (HH) 0.5 - 1.9 mmol/L    Comment: CRITICAL RESULT CALLED TO, READ BACK BY AND VERIFIED WITH: Barnett AbuWISEMAN T,RN 04/21/19 0354 WAYK Performed at [facility], [address] [place], [place] [address]   I-Stat beta hCG blood, ED     Status: None   Collection Time: 04/21/19  2:34 AM  Result Value Ref Range   I-stat hCG, quantitative <5.0 <5 mIU/mL   Comment 3            Comment:   GEST. AGE      CONC.  (mIU/mL)   <=1 WEEK        5 - 50     2 WEEKS       50 - 500     3 WEEKS       100 - 10,000     4 WEEKS     1,000 - 30,000        FEMALE AND NON-PREGNANT FEMALE:     LESS THAN 5 mIU/mL   SARS Coronavirus 2 (CEPHEID - Performed in [facility] hospital lab), Hosp Order     Status: None   Collection Time: 04/21/19  2:53 AM   Specimen: Nasopharyngeal Swab  Result Value Ref Range   SARS Coronavirus 2 NEGATIVE NEGATIVE    Comment: (NOTE) If result is NEGATIVE SARS-CoV-2 target nucleic acids are NOT DETECTED. The SARS-CoV-2 RNA is generally detectable in upper and lower  respiratory specimens during the acute phase of infection. The lowest  concentration of SARS-CoV-2 viral copies this assay can detect is 250  copies / mL. A negative result does not preclude SARS-CoV-2 infection  and should not be used as the sole basis for treatment or other  patient management decisions.  A negative result may occur with  improper specimen collection / handling, submission of specimen other  than nasopharyngeal swab, presence of viral mutation(s) within the  areas targeted by this assay, and inadequate number of viral copies  (<250 copies /  mL). A negative result must be combined with clinical  observations, patient history, and epidemiological information. If result is POSITIVE SARS-CoV-2 target nucleic acids are DETECTED. The SARS-CoV-2 RNA is generally detectable in upper and lower  respiratory specimens dur ing the acute phase of infection.  Positive  results are indicative of active infection with SARS-CoV-2.  Clinical  correlation with patient history and other diagnostic information is  necessary to determine patient infection status.  Positive results do  not rule out bacterial infection or co-infection with other viruses. If result is PRESUMPTIVE POSTIVE SARS-CoV-2 nucleic acids MAY BE PRESENT.   A presumptive positive result was obtained on the submitted specimen  and confirmed on repeat testing.  While 2019 novel coronavirus  (SARS-CoV-2) nucleic acids may be present in the submitted sample  additional confirmatory testing may be necessary for epidemiological  and / or clinical management purposes  to differentiate between  SARS-CoV-2 and other Sarbecovirus currently known to infect humans.  If clinically indicated additional testing with an alternate test  methodology [phone]([id number]) is advised. The SARS-CoV-2 RNA is generally  detectable in upper and lower respiratory sp ecimens during the acute  phase of infection. The expected result is Negative. Fact Sheet for Patients:  BoilerBrush.com.cyhttps://www.fda.gov/media/136312/download Fact Sheet for Healthcare Providers: https://pope.com/https://www.fda.gov/media/136313/download This test is not yet approved or cleared by the [place] FDA and has been authorized for detection and/or diagnosis of SARS-CoV-2 by FDA under an Emergency Use Authorization (EUA).  This EUA will remain in effect (meaning this test can be used) for the duration of the COVID-19 declaration under Section 564(b)(1) of the Act, 21 U.S.C. section 360bbb-3(b)(1), unless the authorization is terminated or revoked  sooner. Performed at [facility], [address] [place], [place] [address]   Magnesium     Status: None   Collection Time: 04/21/19  3:06 AM  Result Value Ref Range   Magnesium 2.0 1.7 - 2.4 mg/dL    Comment: Performed at [facility], [address] [place], [place] [address]  Urine rapid drug screen (hosp performed)     Status: Abnormal   Collection Time: 04/21/19  7:05 AM  Result Value Ref Range   Opiates NONE DETECTED NONE DETECTED   Cocaine NONE DETECTED NONE DETECTED   Benzodiazepines NONE DETECTED NONE DETECTED   Amphetamines POSITIVE (A) NONE DETECTED   Tetrahydrocannabinol NONE DETECTED NONE DETECTED   Barbiturates NONE DETECTED NONE DETECTED    Comment: (NOTE) DRUG SCREEN FOR MEDICAL PURPOSES ONLY.  IF CONFIRMATION IS NEEDED FOR ANY PURPOSE, NOTIFY LAB WITHIN 5 DAYS. LOWEST DETECTABLE LIMITS FOR URINE DRUG SCREEN Drug Class                     Cutoff (ng/mL) Amphetamine and metabolites    1000 Barbiturate and metabolites    200 Benzodiazepine                 200 Tricyclics and metabolites     300 Opiates and metabolites        300 Cocaine and metabolites        300 THC  50 Performed at [facility], [place] [address], [place], [place] [address]   Lactic acid, plasma     Status: None   Collection Time: 04/21/19  7:14 AM  Result Value Ref Range   Lactic Acid, Venous 0.9 0.5 - 1.9 mmol/L    Comment: Performed at [place] [address] [place], [place] [address]  Acetaminophen level     Status: Abnormal   Collection Time: 04/21/19  8:50 AM  Result Value Ref Range   Acetaminophen (Tylenol), Serum <10 (L) 10 - 30 ug/mL    Comment: (NOTE) Therapeutic concentrations vary significantly. A range of 10-30 ug/mL  may be an effective concentration for many patients. However, some  are best treated at concentrations outside of this range. Acetaminophen concentrations >150 ug/mL at 4 hours after ingestion  and >50  ug/mL at 12 hours after ingestion are often associated with  toxic reactions. Performed at [facility], [place] [address], [place], [place] [address]    No results found.  Pending Labs Unresulted Labs (From admission, onward)   None      Vitals/Pain Today's Vitals   04/21/19 1000 04/21/19 1030 04/21/19 1059 04/21/19 1100  BP: 117/80 109/81  108/90  Pulse: 98   88  Resp: 14 13  14   Temp:      TempSrc:      SpO2: 100%   100%  PainSc:   Asleep     Isolation Precautions No active isolations  Medications Medications  sodium chloride 0.9 % bolus 1,000 mL (0 mLs Intravenous Stopped 04/21/19 0557)  potassium chloride SA (K-DUR) CR tablet 40 mEq (40 mEq Oral Given 04/21/19 0335)  sodium chloride 0.9 % bolus 1,000 mL (0 mLs Intravenous Stopped 04/21/19 0842)  ondansetron (ZOFRAN) injection 4 mg (4 mg Intravenous Given 04/21/19 5093)    Mobility walks High fall risk   Focused Assessments    R Recommendations: See Admitting Provider Note  Report given to:   Additional Notes:

## 2019-04-21 NOTE — ED Provider Notes (Signed)
[facility] Provider Note   CSN: [id number] Arrival date & time: 04/21/19  0127    History   Chief Complaint Chief Complaint  Patient presents with  . Drug Overdose  . Suicidal    HPI Kathy Bird is a 33 y.o. female with a remote past medical history of seizures, who presents today for evaluation after an overdose.  Patient reports that she took 2 pills of ecstasy, 10 tramadol, 30 Benadryl, 10 unknown muscle relaxer, 1 bottle of E+J, half a bottle of wine and 2 Percocets over the course of the day.   She states that her husband left her a month ago and had a baby with another woman.  She states that he made her have an abortion when she was pregnant.  She repeatedly states "it is my choice."  When I asked her to elaborate on that she tells me it is my choice to live or die.  She reportedly called her mother who then called EMS to bring her here.  She repeatedly states about her mother "she was not supposed to call."       HPI  Past Medical History:  Diagnosis Date  . Seizures ([place])    as child. last seizure when 8yr old    There are no active problems to display for this patient.   Past Surgical History:  Procedure Laterality Date  . ADENOIDECTOMY       OB History    Gravida  2   Para  1   Term  1   Preterm  0   AB  1   Living  1     SAB  0   TAB  1   Ectopic  0   Multiple  0   Live Births  1            Home Medications    Prior to Admission medications   Medication Sig Start Date End Date Taking? Authorizing Provider  cloNIDine (CATAPRES) 0.1 MG tablet 1 tab po tid x 2 days, then bid x 2 days, then once daily x 2 days 07/24/18   PCharlesetta Shanks MD  loperamide (IMODIUM) 2 MG capsule Take 1 capsule (2 mg total) by mouth 4 (four) times daily as needed for diarrhea or loose stools. 07/24/18   PCharlesetta Shanks MD  ondansetron (ZOFRAN ODT) 4 MG disintegrating tablet Take 1 tablet (4 mg total) by mouth every  4 (four) hours as needed for nausea or vomiting. 07/24/18   PCharlesetta Shanks MD  ondansetron (ZOFRAN-ODT) 4 MG disintegrating tablet Take 1 tablet (4 mg total) by mouth every 8 (eight) hours as needed for nausea or vomiting. 01/15/16   PDavonna Belling MD    Family History No family history on file.  Social History Social History   Tobacco Use  . Smoking status: Current Every Day Smoker    Years: 5.00    Types: Cigarettes  . Smokeless tobacco: Never Used  Substance Use Topics  . Alcohol use: Yes    Comment: socially  . Drug use: No     Allergies   Peanut-containing drug products and Shellfish allergy   Review of Systems Review of Systems  Unable to perform ROS: Psychiatric disorder     Physical Exam Updated Vital Signs BP (!) 123/92   Pulse 96   Temp 98.1 F (36.7 C) (Oral)   Resp 15   SpO2 100%   Physical Exam Vitals signs and nursing note reviewed.  Constitutional:      Appearance: She is well-developed.     Comments: Appears emotionally distraught without obvious physical distress  HENT:     Head: Normocephalic and atraumatic.     Mouth/Throat:     Mouth: Mucous membranes are moist.  Eyes:     Conjunctiva/sclera: Conjunctivae normal.     Comments: Pupils are round, reactive to light.  Not pinpoint.  Neck:     Musculoskeletal: Neck supple.  Cardiovascular:     Rate and Rhythm: Regular rhythm. Tachycardia present.     Heart sounds: No murmur.  Pulmonary:     Effort: Pulmonary effort is normal. No respiratory distress.     Breath sounds: Normal breath sounds.  Abdominal:     Palpations: Abdomen is soft.     Tenderness: There is no abdominal tenderness.  Musculoskeletal:     Right lower leg: No edema.     Left lower leg: No edema.  Skin:    General: Skin is warm and dry.  Neurological:     Mental Status: She is alert.     Comments: Patient is somnolent, however will awaken to voice.  When awake she is able to speak, able to provide history about  what she took.  Psychiatric:        Mood and Affect: Affect is tearful.        Behavior: Behavior is agitated.        Thought Content: Thought content includes suicidal ideation. Thought content includes suicidal plan.     Comments: Patient states its her choice if she lives or dies.        ED Treatments / Results  Labs (all labs ordered are listed, but only abnormal results are displayed) Labs Reviewed  COMPREHENSIVE METABOLIC PANEL - Abnormal; Notable for the following components:      Result Value   Potassium 3.1 (*)    CO2 21 (*)    Glucose, Bld 124 (*)    All other components within normal limits  ETHANOL - Abnormal; Notable for the following components:   Alcohol, Ethyl (B) 94 (*)    All other components within normal limits  ACETAMINOPHEN LEVEL - Abnormal; Notable for the following components:   Acetaminophen (Tylenol), Serum <10 (*)    All other components within normal limits  CBC WITH DIFFERENTIAL/PLATELET - Abnormal; Notable for the following components:   WBC 3.5 (*)    Hemoglobin 9.3 (*)    HCT 32.3 (*)    MCV 78.0 (*)    MCH 22.5 (*)    MCHC 28.8 (*)    RDW 17.9 (*)    All other components within normal limits  LACTIC ACID, PLASMA - Abnormal; Notable for the following components:   Lactic Acid, Venous 3.4 (*)    All other components within normal limits  SARS CORONAVIRUS 2 (HOSPITAL ORDER, [place])  SALICYLATE LEVEL  MAGNESIUM  RAPID URINE DRUG SCREEN, HOSP PERFORMED  ACETAMINOPHEN LEVEL  LACTIC ACID, PLASMA  I-STAT BETA HCG BLOOD, ED ([facility], [facility], [facility] ONLY)    EKG None  Radiology No results found.  Procedures Procedures (including critical care time)  Medications Ordered in ED Medications  sodium chloride 0.9 % bolus 1,000 mL (0 mLs Intravenous Stopped 04/21/19 0557)  potassium chloride SA (K-DUR) CR tablet 40 mEq (40 mEq Oral Given 04/21/19 0335)  sodium chloride 0.9 % bolus 1,000 mL (1,000 mLs Intravenous New Bag/Given  04/21/19 0700)     Initial Impression /  Assessment and Plan / ED Course  I have reviewed the triage vital signs and the nursing notes.  Pertinent labs & imaging results that were available during my care of the patient were reviewed by me and considered in my medical decision making (see chart for details).  Clinical Course as of Apr 20 716  Sun Apr 21, 2019  0218 I spoke with poison control.  Patient has taken 2 pills of ecstasy, 10 tramadol, 30 Benadryl, 10 unknown muscle relaxer, 1 bottle of E+J, half a bottle of wine and 2 Percocets over the course of the day.  They report that there is a possibility of seizures.  Recommend observing her until she is back at normal baseline.  Coingestion labs in addition to a lactic acid and a magnesium level.  Benzos if needed for seizures, IV fluids if needed for hypotension.   [EH]  0231 Patients mother Jones Broom says that she is in the process of being divorced.  She met someone and they had it go bad today.   Mom reports that she is bipolar, thinks she is manic right now.  Mom reports that she called her and said I am going to bring you an envelope and bring my child to stay with you.  She asked her what she has done, told mom took 4 ultram, 4 benadryl and 4 sleeping pills with wine.  Threw up.  Had seizures as a child.     [EH]  0422 Patient evaluated, she is sleeping in no distress.  She awakens to her name being called, is able to tell me her name and that she feels okay.   [EH]    Clinical Course User Index [EH] Lorin Glass, PA-C      Patient presents today for evaluation after a intentional overdose.  She took a combination of 2 pills of ecstasy, 10 pills of tramadol, 30 Benadryl, 10 pills of an unknown muscle relaxer, 1 bottle of whiskey, 2 Percocets, and half a bottle of wine.  She states that she is upset that she broke up and does not want to be here.  She is states that it is her choice if she lives or dies.  Patient's mother reports  that patient has recently been going through a divorce.  She had recently met someone new however mother reports that that went poorly today.  Based on her intentional overdose IVC papers were completed.    Patient was initially tachycardic on arrival.  Her lactic acid was obtained after recommendation by poison control.  This was elevated.  I do not suspect that this is to sepsis.  She was given 2 liters IVF, repeat lactic ordered.  She was somnolent however would awaken to her name being called and was able to protect her own airway.    Plan is to observe patient until her mental status is back to baseline then medically clear for TTS consult and psychiatric placement.    At shift change care was transferred to Martinique Robinson PA-C who will follow pending studies, re-evaulate and determine disposition.     Final Clinical Impressions(s) / ED Diagnoses   Final diagnoses:  Suicide attempt [facility])  Polysubstance overdose, intentional self-harm, initial encounter [facility])    ED Discharge Orders    None       Lorin Glass, PA-C 04/21/19 0865    Merryl Hacker, MD 04/29/19 0221

## 2019-04-22 DIAGNOSIS — R45851 Suicidal ideations: Secondary | ICD-10-CM

## 2019-04-22 LAB — BASIC METABOLIC PANEL
Anion gap: 6 (ref 5–15)
BUN: 10 mg/dL (ref 6–20)
CO2: 23 mmol/L (ref 22–32)
Calcium: 9 mg/dL (ref 8.9–10.3)
Chloride: 107 mmol/L (ref 98–111)
Creatinine, Ser: 0.56 mg/dL (ref 0.44–1.00)
GFR calc Af Amer: >60 mL/min (ref >60)
GFR calc non Af Amer: >60 mL/min (ref >60)
Glucose, Bld: 111 mg/dL — ABNORMAL HIGH (ref 70–99)
Potassium: 4.7 mmol/L (ref 3.5–5.1)
Sodium: 136 mmol/L (ref 135–145)

## 2019-04-22 LAB — TSH: TSH: 0.257 u[IU]/mL — ABNORMAL LOW (ref 0.350–4.500)

## 2019-04-22 MED ORDER — ESCITALOPRAM OXALATE 5 MG PO TABS
5.0000 mg | ORAL_TABLET | Freq: Every day | ORAL | Status: DC
  Administered 2019-04-22 – 2019-04-24 (×3): 5 mg via ORAL
  Filled 2019-04-22: qty 1
  Filled 2019-04-22: qty 7
  Filled 2019-04-22 (×4): qty 1

## 2019-04-22 MED ORDER — TRAMADOL HCL 50 MG PO TABS
100.0000 mg | ORAL_TABLET | Freq: Four times a day (QID) | ORAL | Status: DC
  Administered 2019-04-22 – 2019-04-23 (×3): 100 mg via ORAL
  Filled 2019-04-22 (×4): qty 2

## 2019-04-22 MED ORDER — QUETIAPINE FUMARATE 50 MG PO TABS
50.0000 mg | ORAL_TABLET | Freq: Every day | ORAL | Status: DC
  Administered 2019-04-22 – 2019-04-23 (×2): 50 mg via ORAL
  Filled 2019-04-22 (×4): qty 1
  Filled 2019-04-22: qty 7

## 2019-04-22 NOTE — Progress Notes (Addendum)
Kathy Bird observe in room, seen resting in bed with eyes open. She appears flat/isolative on approach. She denies SI/HI/AVH at this time. Pt states she was asleep all day. Pt requested PRNs to help her sleep tonight. She rates pain 8/10; Left knee. Pt was minimal with interaction. Support offered. Will continue with POC.
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
## 2019-04-22 NOTE — Progress Notes (Signed)
D: Patient is complaining of left knee pain due to "an injury at work, and it has not been the same since."  She requested tramadol when she had just received at 1200.  Administered some naproxen for break through pain.  Patient denies any thoughts of self harm today.  She states, "I'm having a good day today.  I really feel better."  Her affect is flat; she appears more concerned with her knee pain today.  She denies any significant withdrawal symptoms.  She is on the clonidine protocol, and she appears to be tolerating it well.  Her affect is flat; restricted.  She has poor insight regarding her overdose.  A: Continue to monitor medication management and MD orders.  Safety checks completed every 15 minutes per protocol.  Offer support and encouragement as needed.  R: Patient is receptive to staff; her behavior is appropriate.
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
## 2019-04-22 NOTE — Progress Notes (Signed)
[facility] NOVEL CORONAVIRUS (COVID-19) DAILY CHECK-OFF SYMPTOMS - answer yes or no to each - every day NO YES  Have you had a fever in the past 24 hours?  . Fever (Temp > 37.80C / 100F) X   Have you had any of these symptoms in the past 24 hours? . New Cough .  Sore Throat  .  Shortness of Breath .  Difficulty Breathing .  Unexplained Body Aches   X   Have you had any one of these symptoms in the past 24 hours not related to allergies?   . Runny Nose .  Nasal Congestion .  Sneezing   X   If you have had runny nose, nasal congestion, sneezing in the past 24 hours, has it worsened?  X   EXPOSURES - check yes or no X   Have you traveled outside the state in the past 14 days?  X   Have you been in contact with someone with a confirmed diagnosis of COVID-19 or PUI in the past 14 days without wearing appropriate PPE?  X   Have you been living in the same home as a person with confirmed diagnosis of COVID-19 or a PUI (household contact)?    X   Have you been diagnosed with COVID-19?    X              What to do next: Answered NO to all: Answered YES to anything:   Proceed with unit schedule Follow the [facility] Inpatient Flowsheet.   Mask was given and worn.  

## 2019-04-22 NOTE — Tx Team (Signed)
Interdisciplinary Treatment and Diagnostic Plan Update  04/22/2019 Time of Session: 09:53am Kathy Bird MRN: [id number]  Principal Diagnosis: <principal problem not specified>  Secondary Diagnoses: Active Problems:   MDD (major depressive disorder), recurrent severe, without psychosis ([place])   Current Medications:  Current Facility-Administered Medications  Medication Dose Route Frequency Provider Last Rate Last Dose  . acetaminophen (TYLENOL) tablet 650 mg  650 mg Oral Q6H PRN Money, Lowry Ram, FNP   650 mg at 04/21/19 1502  . alum & mag hydroxide-simeth (MAALOX/MYLANTA) 200-200-20 MG/5ML suspension 30 mL  30 mL Oral Q4H PRN Money, Lowry Ram, FNP      . chlordiazePOXIDE (LIBRIUM) capsule 25 mg  25 mg Oral QID PRN Sharma Covert, MD   25 mg at 04/21/19 2141  . cloNIDine (CATAPRES) tablet 0.1 mg  0.1 mg Oral QID Sharma Covert, MD   0.1 mg at 04/22/19 1150   Followed by  . [START ON 04/24/2019] cloNIDine (CATAPRES) tablet 0.1 mg  0.1 mg Oral BH-qamhs Clary, Cordie Grice, MD       Followed by  . [START ON 04/26/2019] cloNIDine (CATAPRES) tablet 0.1 mg  0.1 mg Oral QAC breakfast Sharma Covert, MD      . dicyclomine (BENTYL) tablet 20 mg  20 mg Oral Q6H PRN Sharma Covert, MD      . escitalopram (LEXAPRO) tablet 5 mg  5 mg Oral Daily Sharma Covert, MD   5 mg at 04/22/19 1150  . folic acid (FOLVITE) tablet 1 mg  1 mg Oral Daily Sharma Covert, MD   1 mg at 04/22/19 7616  . hydrOXYzine (ATARAX/VISTARIL) tablet 25 mg  25 mg Oral TID PRN Money, Lowry Ram, FNP      . loperamide (IMODIUM) capsule 2-4 mg  2-4 mg Oral PRN Sharma Covert, MD      . magnesium hydroxide (MILK OF MAGNESIA) suspension 30 mL  30 mL Oral Daily PRN Money, Darnelle Maffucci B, FNP      . methocarbamol (ROBAXIN) tablet 500 mg  500 mg Oral Q8H PRN Sharma Covert, MD   500 mg at 04/22/19 0737  . naproxen (NAPROSYN) tablet 500 mg  500 mg Oral BID PRN Sharma Covert, MD   500 mg at 04/22/19 1410  .  ondansetron (ZOFRAN-ODT) disintegrating tablet 4 mg  4 mg Oral Q6H PRN Sharma Covert, MD      . pneumococcal 23 valent vaccine (PNU-IMMUNE) injection 0.5 mL  0.5 mL Intramuscular Tomorrow-1000 Sharma Covert, MD      . QUEtiapine (SEROQUEL) tablet 50 mg  50 mg Oral QHS Sharma Covert, MD      . thiamine (VITAMIN B-1) tablet 100 mg  100 mg Oral Daily Sharma Covert, MD   100 mg at 04/22/19 0827  . traMADol (ULTRAM) tablet 100 mg  100 mg Oral Q6H Sharma Covert, MD   100 mg at 04/22/19 1151  . traZODone (DESYREL) tablet 50 mg  50 mg Oral QHS PRN Money, Lowry Ram, FNP      . ziprasidone (GEODON) injection 20 mg  20 mg Intramuscular Q6H PRN Sharma Covert, MD       PTA Medications: No medications prior to admission.    Patient Stressors: Marital or family conflict Substance abuse  Patient Strengths: Capable of independent living Armed forces logistics/support/administrative officer Physical Health Supportive family/friends  Treatment Modalities: Medication Management, Group therapy, Case management,  1 to 1 session with clinician, Psychoeducation, Recreational therapy.  Physician Treatment Plan for Primary Diagnosis: <principal problem not specified> Long Term Goal(s): Improvement in symptoms so as ready for discharge Improvement in symptoms so as ready for discharge   Short Term Goals: Ability to identify changes in lifestyle to reduce recurrence of condition will improve Ability to verbalize feelings will improve Ability to disclose and discuss suicidal ideas Ability to demonstrate self-control will improve Ability to identify and develop effective coping behaviors will improve Ability to maintain clinical measurements within normal limits will improve Ability to identify triggers associated with substance abuse/mental health issues will improve Ability to identify changes in lifestyle to reduce recurrence of condition will improve Ability to verbalize feelings will improve Ability to  disclose and discuss suicidal ideas Ability to demonstrate self-control will improve Ability to identify and develop effective coping behaviors will improve Ability to maintain clinical measurements within normal limits will improve Ability to identify triggers associated with substance abuse/mental health issues will improve  Medication Management: Evaluate patient's response, side effects, and tolerance of medication regimen.  Therapeutic Interventions: 1 to 1 sessions, Unit Group sessions and Medication administration.  Evaluation of Outcomes: Progressing  Physician Treatment Plan for Secondary Diagnosis: Active Problems:   MDD (major depressive disorder), recurrent severe, without psychosis (HCC)  Long Term Goal(s): Improvement in symptoms so as ready for discharge Improvement in symptoms so as ready for discharge   Short Term Goals: Ability to identify changes in lifestyle to reduce recurrence of condition will improve Ability to verbalize feelings will improve Ability to disclose and discuss suicidal ideas Ability to demonstrate self-control will improve Ability to identify and develop effective coping behaviors will improve Ability to maintain clinical measurements within normal limits will improve Ability to identify triggers associated with substance abuse/mental health issues will improve Ability to identify changes in lifestyle to reduce recurrence of condition will improve Ability to verbalize feelings will improve Ability to disclose and discuss suicidal ideas Ability to demonstrate self-control will improve Ability to identify and develop effective coping behaviors will improve Ability to maintain clinical measurements within normal limits will improve Ability to identify triggers associated with substance abuse/mental health issues will improve     Medication Management: Evaluate patient's response, side effects, and tolerance of medication regimen.  Therapeutic  Interventions: 1 to 1 sessions, Unit Group sessions and Medication administration.  Evaluation of Outcomes: Progressing   RN Treatment Plan for Primary Diagnosis: <principal problem not specified> Long Term Goal(s): Knowledge of disease and therapeutic regimen to maintain health will improve  Short Term Goals: Ability to participate in decision making will improve, Ability to verbalize feelings will improve, Ability to disclose and discuss suicidal ideas, Ability to identify and develop effective coping behaviors will improve and Compliance with prescribed medications will improve  Medication Management: RN will administer medications as ordered by provider, will assess and evaluate patient's response and provide education to patient for prescribed medication. RN will report any adverse and/or side effects to prescribing provider.  Therapeutic Interventions: 1 on 1 counseling sessions, Psychoeducation, Medication administration, Evaluate responses to treatment, Monitor vital signs and CBGs as ordered, Perform/monitor CIWA, COWS, AIMS and Fall Risk screenings as ordered, Perform wound care treatments as ordered.  Evaluation of Outcomes: Progressing   LCSW Treatment Plan for Primary Diagnosis: <principal problem not specified> Long Term Goal(s): Safe transition to appropriate next level of care at discharge, Engage patient in therapeutic group addressing interpersonal concerns.  Short Term Goals: Engage patient in aftercare planning with referrals and resources and Increase skills for wellness  and recovery  Therapeutic Interventions: Assess for all discharge needs, 1 to 1 time with Child psychotherapistocial worker, Explore available resources and support systems, Assess for adequacy in community support network, Educate family and significant other(s) on suicide prevention, Complete Psychosocial Assessment, Interpersonal group therapy.  Evaluation of Outcomes: Progressing   Progress in Treatment: Attending  groups: No. Participating in groups: No. Taking medication as prescribed: Yes. Toleration medication: Yes. Family/Significant other contact made: No, will contact:  will contact if given consent to contact Patient understands diagnosis: Yes. Discussing patient identified problems/goals with staff: Yes. Medical problems stabilized or resolved: Yes. Denies suicidal/homicidal ideation: Yes. Issues/concerns per patient self-inventory: No. Other:   New problem(s) identified: No, Describe:  None  New Short Term/Long Term Goal(s): Medication stabilization, elimination of SI thoughts, and development of a comprehensive mental wellness plan.   Patient Goals:  "To get focused and organized. Learn how to adapt without drugs"  Discharge Plan or Barriers: CSW will continue to follow up for appropriate referrals and possible discharge planning  Reason for Continuation of Hospitalization: Anxiety Depression Medication stabilization  Estimated Length of Stay: 2-3 days  Attendees: Patient: 04/22/2019  Physician:Dr. Landry MellowGreg Clary, MD 04/22/2019  Nursing:Patrice, RN 04/22/2019  RN Care Manager: 04/22/2019  Social Worker:Reianna Batdorf Earlene Plateravis, LCSW 04/22/2019  Recreational Therapist: 04/22/2019  Other: 04/22/2019  Other: 04/22/2019  Other: 04/22/2019      Scribe for Treatment Team: Delphia GratesJasmine M Imogen Maddalena, LCSW 04/22/2019 3:01 PM

## 2019-04-23 DIAGNOSIS — F332 Major depressive disorder, recurrent severe without psychotic features: Principal | ICD-10-CM

## 2019-04-23 MED ORDER — GABAPENTIN 100 MG PO CAPS
100.0000 mg | ORAL_CAPSULE | Freq: Three times a day (TID) | ORAL | Status: DC
  Administered 2019-04-23 – 2019-04-24 (×3): 100 mg via ORAL
  Filled 2019-04-23 (×4): qty 1
  Filled 2019-04-23 (×2): qty 21
  Filled 2019-04-23: qty 1
  Filled 2019-04-23: qty 21

## 2019-04-23 NOTE — [facility] Group Notes (Signed)
Pt did not attend orientation/goals group. 

## 2019-04-23 NOTE — Progress Notes (Signed)
[place] NOVEL CORONAVIRUS (COVID-19) DAILY CHECK-OFF SYMPTOMS - answer yes or no to each - every day NO YES  Have you had a fever in the past 24 hours?  . Fever (Temp > 37.80C / 100F) X   Have you had any of these symptoms in the past 24 hours? . New Cough .  Sore Throat  .  Shortness of Breath .  Difficulty Breathing .  Unexplained Body Aches   X   Have you had any one of these symptoms in the past 24 hours not related to allergies?   . Runny Nose .  Nasal Congestion .  Sneezing   X   If you have had runny nose, nasal congestion, sneezing in the past 24 hours, has it worsened?  X   EXPOSURES - check yes or no X   Have you traveled outside the state in the past 14 days?  X   Have you been in contact with someone with a confirmed diagnosis of COVID-19 or PUI in the past 14 days without wearing appropriate PPE?  X   Have you been living in the same home as a person with confirmed diagnosis of COVID-19 or a PUI (household contact)?    X   Have you been diagnosed with COVID-19?    X              What to do next: Answered NO to all: Answered YES to anything:   Proceed with unit schedule Follow the [facility] Inpatient Flowsheet.   Mask was given but not worn.  

## 2019-04-23 NOTE — Progress Notes (Signed)
Kathy Bird observe in room, seen resting in bed with eyes open. She appears flat/isolative on approach. She denies SI/HI/AVH at this time. Pt states she was asleep all day and feels rested. Pt was minimal with interaction. Support offered. Will continue with POC.
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
## 2019-04-23 NOTE — Progress Notes (Signed)
[facility] MD Progress Note  04/23/2019 3:03 PM Kathy Bird  MRN:  [id number] Subjective: Patient reports she is feeling a lot better than on admission and as she improves she is becoming more focused on discharging soon.  Currently denies suicidal ideations.  She is future oriented and is hoping to return to her family, states she misses her son, also plans to return to work soon.  Thus far is tolerating medications well.  She is currently on Ultram for management of leg pain.  States that "it does not seem to work".  She is also aware that Tramadol can have some abuse potential and at this time is preferring to stop said medication.  Objective: I have discussed case with treatment team and have met with patient.  33 year old female, separated, employed, has a 46 year old son.  Presented to hospital under IVC, after calling her mother and reporting overdose on Ultram, unspecified OTC and Benadryl, as well as alcohol.  Currently patient denies suicidal intent although does acknowledge having been depressed. Of note, she reports opiate abuse over the last several months.  States she has been recurring different opiate analgesics such as oxycodone, percocet, although denies daily use.  She also reports alcohol use in binges. At this time she is not presenting with symptoms of either alcohol or opiate withdrawal.  Her vitals are stable.  There are no tremors, no diaphoresis, she appears calm, comfortable, in no acute distress.  She is also denying significant symptoms of opiate withdrawal, does not endorse nausea, vomiting, diarrhea. Patient describes chronic pain, affecting mostly knees/lower extremities. She is currently on low-dose Seroquel and Lexapro.  She is tolerating these medications well thus far. Visible in dayroom, interacting appropriately with peers, pleasant on approach Labs reviewed-unremarkable, although TSH is decreased at 0.25.  Patient denies any history of thyroid dysfunction Principal  Problem:  Opiate Use Disorder, Opiate Induced Mood Disorder versus MDD Diagnosis: Active Problems:   MDD (major depressive disorder), recurrent severe, without psychosis ([place])  Total Time spent with patient: 20 minutes  Past Psychiatric History:   Past Medical History:  Past Medical History:  Diagnosis Date  . Seizures ([place])    as child. last seizure when 7yr old    Past Surgical History:  Procedure Laterality Date  . ADENOIDECTOMY     Family History: History reviewed. No pertinent family history. Family Psychiatric  History:  Social History:  Social History   Substance and Sexual Activity  Alcohol Use Yes   Comment: socially     Social History   Substance and Sexual Activity  Drug Use No    Social History   Socioeconomic History  . Marital status: Married    Spouse name: Not on file  . Number of children: Not on file  . Years of education: Not on file  . Highest education level: Not on file  Occupational History  . Not on file  Social Needs  . Financial resource strain: Not on file  . Food insecurity    Worry: Not on file    Inability: Not on file  . Transportation needs    Medical: Not on file    Non-medical: Not on file  Tobacco Use  . Smoking status: Current Every Day Smoker    Years: 5.00    Types: Cigarettes  . Smokeless tobacco: Never Used  Substance and Sexual Activity  . Alcohol use: Yes    Comment: socially  . Drug use: No  . Sexual activity: Yes  Birth control/protection: None    Comment: nuvaring  Lifestyle  . Physical activity    Days per week: Not on file    Minutes per session: Not on file  . Stress: Not on file  Relationships  . Social Herbalist on phone: Not on file    Gets together: Not on file    Attends religious service: Not on file    Active member of club or organization: Not on file    Attends meetings of clubs or organizations: Not on file    Relationship status: Not on file  Other Topics Concern  . Not  on file  Social History Narrative  . Not on file   Additional Social History:   Sleep: Fair/improving  Appetite:  Good  Current Medications: Current Facility-Administered Medications  Medication Dose Route Frequency Provider Last Rate Last Dose  . acetaminophen (TYLENOL) tablet 650 mg  650 mg Oral Q6H PRN Money, Lowry Ram, FNP   650 mg at 04/21/19 1502  . alum & mag hydroxide-simeth (MAALOX/MYLANTA) 200-200-20 MG/5ML suspension 30 mL  30 mL Oral Q4H PRN Money, Lowry Ram, FNP      . chlordiazePOXIDE (LIBRIUM) capsule 25 mg  25 mg Oral QID PRN Sharma Covert, MD   25 mg at 04/21/19 2141  . cloNIDine (CATAPRES) tablet 0.1 mg  0.1 mg Oral QID Sharma Covert, MD   0.1 mg at 04/23/19 1155   Followed by  . [START ON 04/24/2019] cloNIDine (CATAPRES) tablet 0.1 mg  0.1 mg Oral BH-qamhs Clary, Cordie Grice, MD       Followed by  . [START ON 04/26/2019] cloNIDine (CATAPRES) tablet 0.1 mg  0.1 mg Oral QAC breakfast Sharma Covert, MD      . dicyclomine (BENTYL) tablet 20 mg  20 mg Oral Q6H PRN Sharma Covert, MD      . escitalopram (LEXAPRO) tablet 5 mg  5 mg Oral Daily Sharma Covert, MD   5 mg at 04/23/19 1155  . folic acid (FOLVITE) tablet 1 mg  1 mg Oral Daily Sharma Covert, MD   1 mg at 04/23/19 1155  . gabapentin (NEURONTIN) capsule 100 mg  100 mg Oral TID Cobos, Myer Peer, MD      . hydrOXYzine (ATARAX/VISTARIL) tablet 25 mg  25 mg Oral TID PRN Money, Lowry Ram, FNP   25 mg at 04/22/19 2126  . loperamide (IMODIUM) capsule 2-4 mg  2-4 mg Oral PRN Sharma Covert, MD      . magnesium hydroxide (MILK OF MAGNESIA) suspension 30 mL  30 mL Oral Daily PRN Money, Lowry Ram, FNP      . methocarbamol (ROBAXIN) tablet 500 mg  500 mg Oral Q8H PRN Sharma Covert, MD   500 mg at 04/22/19 0762  . naproxen (NAPROSYN) tablet 500 mg  500 mg Oral BID PRN Sharma Covert, MD   500 mg at 04/22/19 1410  . ondansetron (ZOFRAN-ODT) disintegrating tablet 4 mg  4 mg Oral Q6H PRN Sharma Covert, MD      . pneumococcal 23 valent vaccine (PNU-IMMUNE) injection 0.5 mL  0.5 mL Intramuscular Tomorrow-1000 Sharma Covert, MD      . QUEtiapine (SEROQUEL) tablet 50 mg  50 mg Oral QHS Sharma Covert, MD   50 mg at 04/22/19 2126  . thiamine (VITAMIN B-1) tablet 100 mg  100 mg Oral Daily Sharma Covert, MD   100 mg at 04/23/19 1155  .  traZODone (DESYREL) tablet 50 mg  50 mg Oral QHS PRN Money, Lowry Ram, FNP   50 mg at 04/22/19 2126  . ziprasidone (GEODON) injection 20 mg  20 mg Intramuscular Q6H PRN Sharma Covert, MD        Lab Results:  Results for orders placed or performed during the hospital encounter of 04/21/19 (from the past 48 hour(s))  TSH     Status: Abnormal   Collection Time: 04/22/19  6:26 AM  Result Value Ref Range   TSH 0.257 (L) 0.350 - 4.500 uIU/mL    Comment: Performed by a 3rd Generation assay with a functional sensitivity of <=0.01 uIU/mL. Performed at [facility], [place] [address] [place], [place] [address]   Basic metabolic panel     Status: Abnormal   Collection Time: 04/22/19  6:26 AM  Result Value Ref Range   Sodium 136 135 - 145 mmol/L   Potassium 4.7 3.5 - 5.1 mmol/L   Chloride 107 98 - 111 mmol/L   CO2 23 22 - 32 mmol/L   Glucose, Bld 111 (H) 70 - 99 mg/dL   BUN 10 6 - 20 mg/dL   Creatinine, Ser 0.56 0.44 - 1.00 mg/dL   Calcium 9.0 8.9 - 10.3 mg/dL   GFR calc non Af Amer >60 >60 mL/min   GFR calc Af Amer >60 >60 mL/min   Anion gap 6 5 - 15    Comment: Performed at [facility], [place] [address] [place], [place] [address]    Blood Alcohol level:  Lab Results  Component Value Date   ETH 94 (H) 26/83/4196    Metabolic Disorder Labs: No results found for: HGBA1C, MPG No results found for: PROLACTIN No results found for: CHOL, TRIG, HDL, CHOLHDL, VLDL, LDLCALC  Physical Findings: AIMS: Facial and Oral Movements Muscles of Facial Expression: None, normal Lips and Perioral Area: None,  normal Jaw: None, normal Tongue: None, normal,Extremity Movements Upper (arms, wrists, hands, fingers): None, normal Lower (legs, knees, ankles, toes): None, normal, Trunk Movements Neck, shoulders, hips: None, normal, Overall Severity Severity of abnormal movements (highest score from questions above): None, normal Incapacitation due to abnormal movements: None, normal Patient's awareness of abnormal movements (rate only patient's report): No Awareness, Dental Status Current problems with teeth and/or dentures?: No Does patient usually wear dentures?: No  CIWA:    COWS:  COWS Total Score: 3  Musculoskeletal: Strength & Muscle Tone: within normal limits-no tremors, no diaphoresis, no restlessness or psychomotor agitation Gait & Station: normal Patient leans: N/A  Psychiatric Specialty Exam: Physical Exam  ROS no chest pain, no shortness of breath, no cough, no fever, no chills, reports bilateral lower extremity pain  Blood pressure 105/76, pulse 84, temperature 98 F (36.7 C), temperature source Oral, resp. rate 16, height _0  (1.626 m), weight 62.6 kg, SpO2 100 %.Body mass index is 23.69 kg/m.  General Appearance: Well Groomed  Eye Contact:  Good  Speech:  Normal Rate  Volume:  Normal  Mood:  Reports mood is improved compared to admission and today presents euthymic  Affect:  Currently appropriate/full in range  Thought Process:  Linear and Descriptions of Associations: Intact  Orientation:  Full (Time, Place, and Person)  Thought Content:  No hallucinations, no delusions  Suicidal Thoughts:  No at this time denies suicidal or self-injurious ideations, also denies any homicidal or violent ideations  Homicidal Thoughts:  No  Memory:  Recent and remote grossly intact  Judgement:  Fair/improving  Insight:  Fair  Psychomotor Activity:  Normal  Concentration:  Concentration: Good and Attention Span: Good  Recall:  Good  Fund of Knowledge:  Good  Language:  Good  Akathisia:   Negative  Handed:  Right  AIMS (if indicated):     Assets:  Desire for Improvement Resilience  ADL's:  Intact  Cognition:  WNL  Sleep:  Number of Hours: 5.75   Assessment-  33 year old female, separated, employed, has a 83 year old son.  Presented to hospital under IVC, after calling her mother and reporting overdose on Ultram, unspecified OTC and Benadryl, as well as alcohol.  Currently patient denies suicidal intent although does acknowledge having been depressed. Of note, she reports opiate abuse over the last several months.  States she has been recurring different opiate analgesics such as oxycodone, percocet, although denies daily use.  She also reports alcohol use in binges.  Today patient describes improving mood and at this time presents euthymic, with a full range of affect.  Denies suicidal ideations.  Currently she is not presenting with symptoms of opiate withdrawal or of alcohol withdrawal, vitals are stable.  She reports motivation in stopping opiate or opiate type medications, including tramadol, which she states she does not feel is effective for her.  Currently on Lexapro and Seroquel, denies side effects. Interested in Neurontin for management of pain and anxiety.  Side effects reviewed. Treatment Plan Summary: Daily contact with patient to assess and evaluate symptoms and progress in treatment, Medication management, Plan Inpatient treatment and Medication as below Encourage group and milieu participation to work on coping skills and symptom reduction Encourage efforts to work on sobriety and abstinence Continue opiate detox protocol with clonidine to minimize symptoms of opiate withdrawal Continue Librium 25 mg every 6 hours PRN alcohol withdrawal as per CIWA score Continue Lexapro at 5 mg daily for depression and anxiety Continue Seroquel at 50 mg nightly for mood, anxiety, insomnia Discontinue trazodone Discontinue Ultram Start Neurontin 100 mg 3 times daily Continue  Robaxin 500 mg every 8 hours as needed spasms Treatment team working on disposition planning options Check T3 and T4 to follow-up on low TSH Jenne Campus, MD 04/23/2019, 3:03 PM

## 2019-04-23 NOTE — Progress Notes (Signed)
Writer notified pt of scheduled meds. Pt was lying down in bed. Write asked pt if she was feeling drowsy due to sleeping a lot. Pt stated "no." Pt did not come to the med room for scheduled meds. Pt is isolative to her room and appears withdrawn.    Pt encouraged to engaged in the milieu. 15 minute checks performed for safety.

## 2019-04-24 DIAGNOSIS — F332 Major depressive disorder, recurrent severe without psychotic features: Principal | ICD-10-CM

## 2019-04-24 LAB — T4, FREE: Free T4: 0.69 ng/dL (ref 0.61–1.12)

## 2019-04-24 MED ORDER — QUETIAPINE FUMARATE 50 MG PO TABS: 50.0000 mg | ORAL_TABLET | Freq: Every day | ORAL | 0 refills | Status: DC

## 2019-04-24 MED ORDER — HYDROXYZINE HCL 25 MG PO TABS: 25.0000 mg | ORAL_TABLET | Freq: Three times a day (TID) | ORAL | 0 refills | Status: DC | PRN

## 2019-04-24 MED ORDER — GABAPENTIN 100 MG PO CAPS: 100.0000 mg | ORAL_CAPSULE | Freq: Three times a day (TID) | ORAL | 0 refills | Status: DC

## 2019-04-24 MED ORDER — ESCITALOPRAM OXALATE 5 MG PO TABS: 5.0000 mg | ORAL_TABLET | Freq: Every day | ORAL | 0 refills | Status: DC

## 2019-04-24 NOTE — Progress Notes (Signed)
  [facility] Adult Case Management Discharge Plan :  Will you be returning to the same living situation after discharge:  Yes,  patient reports she is returning home At discharge, do you have transportation home?: Yes,  patient reports her mother or brother will pick her up Do you have the ability to pay for your medications: No.  Release of information consent forms completed and in the chart;  Patient's signature needed at discharge.  Patient to Follow up at: Follow-up Information    [facility] Follow up on 05/07/2019.   Why: Telephonic hospital follow up appointment is Tuesday, 7/7 at 9:00a.  The provider will contact you day of the appointment.  Contact information: [address] [place] [address] [phone]           Next level of care provider has access to [place] and Suicide Prevention discussed: Yes,  with the patient's friend     Has patient been referred to the Quitline?: N/A patient is not a smoker  Patient has been referred for addiction treatment: N/A  Marylee Floras, [place] 04/24/2019, 1:34 PM

## 2019-04-24 NOTE — [facility] Suicide Risk Assessment (Signed)
[place] INPATIENT:  Family/Significant Other Suicide Prevention Education  Suicide Prevention Education:  Education Completed; Randall An, friend [phone]) has been identified by the patient as the family member/significant other with whom the patient will be residing, and identified as the person(s) who will aid the patient in the event of a mental health crisis (suicidal ideations/suicide attempt).  With written consent from the patient, the family member/significant other has been provided the following suicide prevention education, prior to the and/or following the discharge of the patient.  The suicide prevention education provided includes the following:  Suicide risk factors  Suicide prevention and interventions  National Suicide Hotline telephone number  [facility] assessment telephone number  [facility] Emergency Assistance [place] and/or Residential Mobile Crisis Unit telephone number  Request made of family/significant other to:  Remove weapons (e.g., guns, rifles, knives), all items previously/currently identified as safety concern.    Remove drugs/medications (over-the-counter, prescriptions, illicit drugs), all items previously/currently identified as a safety concern.  The family member/significant other verbalizes understanding of the suicide prevention education information provided.  The family member/significant other agrees to remove the items of safety concern listed above.  Rhona Leavens reports he does not have any questions or concerns regarding the patient's discharge.   Marylee Floras 04/24/2019, 1:35 PM
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
## 2019-04-24 NOTE — [facility] Suicide Risk Assessment (Signed)
[facility] INPATIENT:  Family/Significant Other Suicide Prevention Education  Suicide Prevention Education:  Contact Attempts: with best friend, Randall An [phone]) has been identified by the patient as the family member/significant other with whom the patient will be residing, and identified as the person(s) who will aid the patient in the event of a mental health crisis.  With written consent from the patient, two attempts were made to provide suicide prevention education, prior to and/or following the patient's discharge.  We were unsuccessful in providing suicide prevention education.  A suicide education pamphlet was given to the patient to share with family/significant other.  Date and time of first attempt:04/24/2019 / 9:17am   Marylee Floras 04/24/2019, 9:16 AM

## 2019-04-24 NOTE — [facility] Group Notes (Signed)
Adult Psychoeducational Group Note  Date:  04/24/2019 Time:  9:24 AM  Group Topic/Focus:  Goals Group:   The focus of this group is to help patients establish daily goals to achieve during treatment and discuss how the patient can incorporate goal setting into their daily lives to aide in recovery.  Participation Level:  Active  Participation Quality:  Appropriate  Affect:  Appropriate  Cognitive:  Alert  Insight: Appropriate  Engagement in Group:  Engaged  Modes of Intervention:  Orientation  Additional Comments:  Pt attended and participated in orientation/goals group. Pt goal for today is to get discharged.   Kathy Bird 04/24/2019, 9:24 AM

## 2019-04-24 NOTE — [facility] Suicide Risk Assessment (Signed)
[facility]   Principal Problem:  Depression Discharge Diagnoses: Active Problems:   MDD (major depressive disorder), recurrent severe, without psychosis (HCC)   Total Time spent with patient: 30 minutes  Musculoskeletal: Strength & Muscle Tone: within normal limits Gait & Station: normal Patient leans: N/A  Psychiatric Specialty Exam: ROS no shortness of breath, no cough, no fever or chills, currently reports lower extremity/knee pain is improved .   Blood pressure 92/61, pulse 91, temperature 98.4 F (36.9 C), temperature source Oral, resp. rate 20, height 5\' 4"  (1.626 m), weight 62.6 kg, SpO2 100 %.Body mass index is 23.69 kg/m.  General Appearance: Well Groomed  Eye Contact::  Good  Speech:  Normal Rate409  Volume:  Normal  Mood:  improved, states she is feeling "  a lot better"  Affect:  Appropriate and reactive  Thought Process:  Linear and Descriptions of Associations: Intact  Orientation:  Full (Time, Place, and Person)  Thought Content:  no hallucinations, no delusions, not internally preoccupied   Suicidal Thoughts:  No denies suicidal or self injurious ideations  Homicidal Thoughts:  No denies any homicidal or violent ideations, and specifically denies any homicidal or violent ideations towards husband or the female he is now with  Memory:  recent and remote grosly   Judgement:  Other:  improved   Insight:  improved   Psychomotor Activity:  Normal  Concentration:  Good  Recall:  Good  Fund of Knowledge:Good  Language: Good  Akathisia:  Negative  Handed:  Right  AIMS (if indicated):     Assets:  Desire for Improvement Resilience  Sleep:  Number of Hours: 5.75  Cognition: WNL  ADL's:  Intact   Mental Status Per Nursing Assessment::   On Admission:  Suicidal ideation indicated by patient  Demographic Factors:  33 year old female, currently separated, has a ten year old son, employed   Loss Factors: Separation, recently found out  that her husband has another family she was unaware of  Historical Factors: History of substance abuse, history of mood disorder.   Risk Reduction Factors:   Responsible for children under 31 years of age, Sense of responsibility to family, Employed and Positive coping skills or problem solving skills  Continued Clinical Symptoms:  At this time patient presents alert, attentive, pleasant on approach, calm, reports feeling "a lot better" than on admission.  Affect is appropriate and bright at present.  No thought disorder.  Denies suicidal ideations.  Denies any homicidal or violent ideations.  No hallucinations, no delusions, future oriented.  Looking forward to seeing her child and to return to work..  Behavior on unit in good control, interactive with peers, pleasant on approach. No current symptoms of withdrawal-vitals are stable.  Currently tolerating medications well, denies side effects. Cognitive Features That Contribute To Risk:  No gross cognitive deficits noted upon discharge. Is alert , attentive, and oriented x 3   Suicide Risk:  Mild:  Suicidal ideation of limited frequency, intensity, duration, and specificity.  There are no identifiable plans, no associated intent, mild dysphoria and related symptoms, good self-control (both objective and subjective assessment), few other risk factors, and identifiable protective factors, including available and accessible social support.  Follow-up Information    [facility] Follow up on 05/07/2019.   Why: Telephonic hospital follow up appointment is Tuesday, 7/7 at 9:00a.  The provider will contact you day of the appointment.  Contact information: [address] [place] [address] [phone]  Plan Of Care/Follow-up recommendations:  Activity:  as tolerated Diet:  regular Tests:  NA Other:  See below  Patient is expressing readiness for discharge and is leaving unit in good spirits .  There are no current grounds for  ongoing involuntary commitment.  She plans to return home.  Follow-up as above.   Jenne Campus, MD 04/24/2019, 9:24 AM

## 2019-04-24 NOTE — Progress Notes (Signed)
Patient ID: Kathy Bird, female   DOB: 12-Aug-1986, 33 y.o.   MRN: [id number]   [place] NOVEL CORONAVIRUS (COVID-19) DAILY CHECK-OFF SYMPTOMS - answer yes or no to each - every day NO YES  Have you had a fever in the past 24 hours?  . Fever (Temp > 37.80C / 100F) X   Have you had any of these symptoms in the past 24 hours? . New Cough .  Sore Throat  .  Shortness of Breath .  Difficulty Breathing .  Unexplained Body Aches   X   Have you had any one of these symptoms in the past 24 hours not related to allergies?   . Runny Nose .  Nasal Congestion .  Sneezing   X   If you have had runny nose, nasal congestion, sneezing in the past 24 hours, has it worsened?  X   EXPOSURES - check yes or no X   Have you traveled outside the state in the past 14 days?  X   Have you been in contact with someone with a confirmed diagnosis of COVID-19 or PUI in the past 14 days without wearing appropriate PPE?  X   Have you been living in the same home as a person with confirmed diagnosis of COVID-19 or a PUI (household contact)?    X   Have you been diagnosed with COVID-19?    X              What to do next: Answered NO to all: Answered YES to anything:   Proceed with unit schedule Follow the [facility] Inpatient Flowsheet.

## 2019-04-24 NOTE — Progress Notes (Signed)
Patient ID: Kathy Bird, female   DOB: 06-19-1986, 33 y.o.   MRN: [id number]  Discharge Note  Patient is pleasant and appropriate on approach. Patient expressing interest in discharge so she can get home to her son. Patient denies withdrawal, denies SI/HI and states readiness for discharge.  Written and verbal discharge instructions reviewed with the patient. Patient accepting to information and verbalized understanding with no concerns. All belongings returned to patient from the unit and secured lockers. Patient has completed their Suicide Safety Plan and has been provided Suicide Prevention resources. Patient provided an opportunity to complete and return Patient Satisfaction Survey.   Patient was safely escorted to the lobby for discharge. Patient discharged from [facility] with medication samples, prescriptions sent to pharmacy, personal belongings from unit/locker, follow-up appointment in place and discharge paperwork.

## 2019-04-25 LAB — T3, FREE: T3, Free: 2.1 pg/mL (ref 2.0–4.4)

## 2019-08-24 ENCOUNTER — Ambulatory Visit (HOSPITAL_COMMUNITY)
Admission: EM | Admit: 2019-08-24 | Discharge: 2019-08-24 | Disposition: A | Discharge status: Home / Self Care | Payer: Self-pay | Attending: Family Medicine | Admitting: Family Medicine

## 2019-08-24 ENCOUNTER — Encounter (HOSPITAL_COMMUNITY): Payer: Self-pay

## 2019-08-24 ENCOUNTER — Other Ambulatory Visit: Payer: Self-pay

## 2019-08-24 DIAGNOSIS — E860 Dehydration: Secondary | ICD-10-CM | POA: Insufficient documentation

## 2019-08-24 DIAGNOSIS — R000 Tachycardia, unspecified: Secondary | ICD-10-CM | POA: Insufficient documentation

## 2019-08-24 DIAGNOSIS — R197 Diarrhea, unspecified: Secondary | ICD-10-CM

## 2019-08-24 DIAGNOSIS — Z79899 Other long term (current) drug therapy: Secondary | ICD-10-CM | POA: Insufficient documentation

## 2019-08-24 DIAGNOSIS — K529 Noninfective gastroenteritis and colitis, unspecified: Secondary | ICD-10-CM | POA: Insufficient documentation

## 2019-08-24 DIAGNOSIS — F17210 Nicotine dependence, cigarettes, uncomplicated: Secondary | ICD-10-CM | POA: Insufficient documentation

## 2019-08-24 DIAGNOSIS — R112 Nausea with vomiting, unspecified: Secondary | ICD-10-CM | POA: Insufficient documentation

## 2019-08-24 DIAGNOSIS — Z20828 Contact with and (suspected) exposure to other viral communicable diseases: Secondary | ICD-10-CM | POA: Insufficient documentation

## 2019-08-24 MED ORDER — ONDANSETRON 4 MG PO TBDP
8.0000 mg | ORAL_TABLET | Freq: Once | ORAL | Status: AC
  Administered 2019-08-24: 17:00:00 8 mg via ORAL

## 2019-08-24 MED ORDER — ONDANSETRON 4 MG PO TBDP
ORAL_TABLET | ORAL | Status: AC
  Filled 2019-08-24: qty 1

## 2019-08-24 MED ORDER — ONDANSETRON 8 MG PO TBDP: 8.0000 mg | ORAL_TABLET | Freq: Three times a day (TID) | ORAL | 0 refills | Status: DC | PRN

## 2019-08-24 NOTE — ED Triage Notes (Signed)
Pt present vomiting and diarrhea. Symptoms started  About a week.

## 2019-08-24 NOTE — ED Provider Notes (Signed)
MRN: [id number] DOB: [birth year]  Subjective:   Kathy Bird is a 33 y.o. female presenting for 4-5 day history of persistent worsening diarrhea, vomiting with nausea. Has ~4 episodes of diarrhea, occasional episodes of vomiting.  Prior to her illness she was eating same foods she normally eats, did have some restaurant foods though. Has tried Imodium and Pepto with some relief.  Since becoming ill she has had a difficult time holding anything down, readily has nausea and diarrhea.  Today she is drink very little fluids and has not had anything to eat.  Her employer is requiring that she get tested for COVID-19 prior to return to work.   No current facility-administered medications for this encounter.   Current Outpatient Medications:  .  escitalopram (LEXAPRO) 5 MG tablet, Take 1 tablet (5 mg total) by mouth daily., Disp: 30 tablet, Rfl: 0 .  gabapentin (NEURONTIN) 100 MG capsule, Take 1 capsule (100 mg total) by mouth 3 (three) times daily., Disp: 90 capsule, Rfl: 0 .  hydrOXYzine (ATARAX/VISTARIL) 25 MG tablet, Take 1 tablet (25 mg total) by mouth 3 (three) times daily as needed for anxiety., Disp: 30 tablet, Rfl: 0 .  QUEtiapine (SEROQUEL) 50 MG tablet, Take 1 tablet (50 mg total) by mouth at bedtime., Disp: 30 tablet, Rfl: 0    Allergies  Allergen Reactions  . Peanut-Containing Drug Products Hives  . Shellfish Allergy Hives, Itching and Swelling    Tongue swelling also    Past Medical History:  Diagnosis Date  . Seizures ([place])    as child. last seizure when 55yrs old     Past Surgical History:  Procedure Laterality Date  . ADENOIDECTOMY      Review of Systems  Constitutional: Negative for fever and malaise/fatigue.  HENT: Negative for congestion, ear pain, sinus pain and sore throat.   Eyes: Negative for discharge and redness.  Respiratory: Negative for cough, hemoptysis, shortness of breath and wheezing.   Cardiovascular: Negative for chest pain.  Gastrointestinal:  Positive for diarrhea, nausea and vomiting. Negative for abdominal pain, blood in stool and constipation.  Genitourinary: Negative for dysuria, flank pain and hematuria.  Musculoskeletal: Negative for myalgias.  Skin: Negative for rash.  Neurological: Negative for dizziness, weakness and headaches.  Psychiatric/Behavioral: Negative for depression and substance abuse.    Social History   Tobacco Use  . Smoking status: Current Every Day Smoker    Years: 5.00    Types: Cigarettes  . Smokeless tobacco: Never Used  Substance Use Topics  . Alcohol use: Yes    Comment: socially  . Drug use: No     History reviewed. No pertinent family history.   Objective:   Vitals: BP 126/88 (BP Location: Right Arm)   Pulse (!) 123   Temp 98.7 F (37.1 C) (Skin)   Resp 16   SpO2 100%   Wt Readings from Last 3 Encounters:  01/15/16 140 lb (63.5 kg)  11/06/15 140 lb (63.5 kg)  10/17/15 145 lb (65.8 kg)   Temp Readings from Last 3 Encounters:  08/24/19 98.7 F (37.1 C) (Skin)  04/21/19 98.1 F (36.7 C) (Oral)  07/24/18 98.5 F (36.9 C) (Oral)   BP Readings from Last 3 Encounters:  08/24/19 126/88  04/21/19 108/90  07/24/18 (!) 151/98   Pulse Readings from Last 3 Encounters:  08/24/19 (!) 123  04/21/19 88  07/24/18 100    Pulse was 107 bpm on recheck by PA-Journi Moffa.  Physical Exam Constitutional:      General:  She is not in acute distress.    Appearance: Normal appearance. She is well-developed and normal weight. She is not ill-appearing, toxic-appearing or diaphoretic.  HENT:     Head: Normocephalic and atraumatic.     Right Ear: External ear normal.     Left Ear: External ear normal.     Nose: Nose normal.     Mouth/Throat:     Mouth: Mucous membranes are moist.     Pharynx: Oropharynx is clear.  Eyes:     General: No scleral icterus.    Extraocular Movements: Extraocular movements intact.     Pupils: Pupils are equal, round, and reactive to light.  Cardiovascular:      Rate and Rhythm: Normal rate and regular rhythm.     Heart sounds: Normal heart sounds. No murmur. No friction rub. No gallop.   Pulmonary:     Effort: Pulmonary effort is normal. No respiratory distress.     Breath sounds: Normal breath sounds. No stridor. No wheezing, rhonchi or rales.  Abdominal:     General: Bowel sounds are normal. There is no distension.     Palpations: Abdomen is soft. There is no mass.     Tenderness: There is no abdominal tenderness. There is no right CVA tenderness, left CVA tenderness, guarding or rebound.  Skin:    General: Skin is warm and dry.     Coloration: Skin is not pale.     Findings: No rash.  Neurological:     General: No focal deficit present.     Mental Status: She is alert and oriented to person, place, and time.  Psychiatric:        Mood and Affect: Mood normal.        Behavior: Behavior normal.        Thought Content: Thought content normal.        Judgment: Judgment normal.      Assessment and Plan :   1. Gastroenteritis   2. Nausea vomiting and diarrhea   3. Tachycardia   4. Mild dehydration     Suspect her tachycardia is related to dehydration.  Physical exam findings very reassuring.  We will manage for gastroenteritis with supportive care.  Recommended patient hydrate well, eat light meals and maintain electrolytes.  Will use Zofran and Imodium for nausea, vomiting and diarrhea. Counseled patient on potential for adverse effects with medications prescribed/recommended today, ER and return-to-clinic precautions discussed, patient verbalized understanding.    Wallis Bamberg, [place] 08/24/19 1652

## 2019-08-26 LAB — NOVEL CORONAVIRUS, NAA (HOSP ORDER, SEND-OUT TO REF LAB; TAT 18-24 HRS): SARS-CoV-2, NAA: NOT DETECTED

## 2019-08-27 ENCOUNTER — Telehealth: Payer: Self-pay

## 2019-08-27 NOTE — Telephone Encounter (Signed)
Called and informed patient that test for Covid 19 was NEGATIVE. Discussed signs and symptoms of Covid 19 : fever, chills, respiratory symptoms, cough, ENT symptoms, sore throat, SOB, muscle pain, diarrhea, headache, loss of taste/smell, close exposure to COVID-19 patient. Pt instructed to call PCP if they develop the above signs and sx. Pt also instructed to call 911 if having respiratory issues/distress. Discussed MyChart enrollment. Pt verbalized understanding.  

## 2019-12-28 ENCOUNTER — Other Ambulatory Visit: Payer: Self-pay

## 2019-12-28 ENCOUNTER — Encounter (HOSPITAL_COMMUNITY): Payer: Self-pay

## 2019-12-28 ENCOUNTER — Ambulatory Visit (HOSPITAL_COMMUNITY)
Admission: EM | Admit: 2019-12-28 | Discharge: 2019-12-28 | Disposition: A | Discharge status: Home / Self Care | Payer: Self-pay | Attending: Emergency Medicine | Admitting: Emergency Medicine

## 2019-12-28 DIAGNOSIS — J020 Streptococcal pharyngitis: Secondary | ICD-10-CM

## 2019-12-28 LAB — POCT RAPID STREP A: Streptococcus, Group A Screen (Direct): POSITIVE — AB

## 2019-12-28 MED ORDER — DEXAMETHASONE SODIUM PHOSPHATE 10 MG/ML IJ SOLN
10.0000 mg | Freq: Once | INTRAMUSCULAR | Status: AC
  Administered 2019-12-28: 18:00:00 10 mg via INTRAMUSCULAR

## 2019-12-28 MED ORDER — PENICILLIN G BENZATHINE 1200000 UNIT/2ML IM SUSP
1.2000 10*6.[IU] | Freq: Once | INTRAMUSCULAR | Status: AC
  Administered 2019-12-28: 1.2 10*6.[IU] via INTRAMUSCULAR

## 2019-12-28 MED ORDER — PENICILLIN G BENZATHINE 1200000 UNIT/2ML IM SUSP
INTRAMUSCULAR | Status: AC
  Filled 2019-12-28: qty 2

## 2019-12-28 MED ORDER — IBUPROFEN 600 MG PO TABS: 600.0000 mg | ORAL_TABLET | Freq: Four times a day (QID) | ORAL | 0 refills | Status: DC | PRN

## 2019-12-28 MED ORDER — DEXAMETHASONE SODIUM PHOSPHATE 10 MG/ML IJ SOLN
INTRAMUSCULAR | Status: AC
  Filled 2019-12-28: qty 1

## 2019-12-28 NOTE — Discharge Instructions (Signed)
your rapid strep was positive today so we treated you with antibiotics and steroids.  You do not need any further antibiotics.  1 gram of Tylenol and 600 mg ibuprofen together 3-4 times a day as needed for pain.  Make sure you drink plenty of extra fluids.  Some people find salt water gargles and  Traditional Medicinal's "Throat Coat" tea helpful. Take 5 mL of liquid Benadryl and 5 mL of Maalox. Mix it together, and then hold it in your mouth for as long as you can and then swallow. You may do this 4 times a day.    Below is a list of primary care practices who are taking new patients for you to follow-up with.  [facility] clinic Ground Floor - [facility], [address] [place], [place], [place] [address] [phone]  [facility] at [facility] [address] [place], [place] [address] [phone]  [facility] [address] [place], [place] [address] [phone]  [facility] [phone] [address] [place] [address]  [facility]: [address] [place] [address]  [phone]  [facility]: [address] [place] [address]   [phone]  [facility]: [address] [place] [address]  [phone]  [facility] : [address] [place] [address] [phone]  [facility]: [address] [place] [address] [phone]  [facility]: [address] [place] [address] [phone]  Dr. Oneal Grout [address] [facility] [place] [address]  [phone]  Dr. Jackie Plum, [facility]. [address] [place], [place] [address]  [phone]  Go to www.goodrx.com to look up  your medications. This will give you a list of where you can find your prescriptions at the most affordable prices. Or ask the pharmacist what the cash price is, or if they have any other discount programs available to help make your medication more affordable. This can be less expensive than what you would pay with insurance.

## 2019-12-28 NOTE — ED Provider Notes (Signed)
HPI  SUBJECTIVE:  Patient reports sore throat starting yesterday. Sx worse with swallowing.  Sx better with 400 mg of ibuprofen, salt water rinses.  Symptoms are worse with Listerine. + Fever tmax 104 Positive exudates both tonsils noticed by patient + Swollen neck glands   No neck stiffness  No Cough + nasal congestion, rhinorrhea + Myalgias + Headache No Rash  No loss of smell.  States that she has lost her sense of taste No shortness of breath or difficulty breathing No nausea, vomiting No diarrhea No abdominal pain     No Recent Strep, mono, COVID, flu exposure.  No flu shot this year.   No reflux sxs No Allergy sxs  No Breathing difficulty,  + Sensation of throat swelling shut + Hoarse voice No Drooling No Trismus No abx in past month. All immunizations UTD.  + antipyretic in past 4-6 hrs-400 mg of ibuprofen PTA Past medical history of Covid.  No history of frequent strep, diabetes, asthma, smoking, mono. LMP: 1/25.  Denies the possibility of being pregnant PMD: None   Past Medical History:  Diagnosis Date  . Seizures ([place])    as child. last seizure when 31yrs old    Past Surgical History:  Procedure Laterality Date  . ADENOIDECTOMY      History reviewed. No pertinent family history.  Social History   Tobacco Use  . Smoking status: Current Every Day Smoker    Years: 5.00    Types: Cigarettes  . Smokeless tobacco: Never Used  Substance Use Topics  . Alcohol use: Yes    Comment: socially  . Drug use: No    No current facility-administered medications for this encounter.  Current Outpatient Medications:  .  escitalopram (LEXAPRO) 5 MG tablet, Take 1 tablet (5 mg total) by mouth daily., Disp: 30 tablet, Rfl: 0 .  gabapentin (NEURONTIN) 100 MG capsule, Take 1 capsule (100 mg total) by mouth 3 (three) times daily., Disp: 90 capsule, Rfl: 0 .  hydrOXYzine (ATARAX/VISTARIL) 25 MG tablet, Take 1 tablet (25 mg total) by mouth 3 (three) times daily as  needed for anxiety., Disp: 30 tablet, Rfl: 0 .  ibuprofen (ADVIL) 600 MG tablet, Take 1 tablet (600 mg total) by mouth every 6 (six) hours as needed., Disp: 30 tablet, Rfl: 0 .  ondansetron (ZOFRAN-ODT) 8 MG disintegrating tablet, Take 1 tablet (8 mg total) by mouth every 8 (eight) hours as needed for nausea or vomiting., Disp: 20 tablet, Rfl: 0 .  QUEtiapine (SEROQUEL) 50 MG tablet, Take 1 tablet (50 mg total) by mouth at bedtime., Disp: 30 tablet, Rfl: 0  Allergies  Allergen Reactions  . Peanut-Containing Drug Products Hives  . Shellfish Allergy Hives, Itching and Swelling    Tongue swelling also     ROS  As noted in HPI.   Physical Exam  BP 127/90 (BP Location: Left Arm)   Pulse (!) 122   Temp 98.6 F (37 C) (Oral)   Resp 18   SpO2 100%   Constitutional: Well developed, well nourished, no acute distress Eyes:  EOMI, conjunctiva normal bilaterally HENT: Normocephalic, atraumatic,mucus membranes moist. - nasal congestion +erythematous oropharynx + erythematous, enlarged tonsils - exudates. Uvula midline.  No stridor, neck stiffness Respiratory: Normal inspiratory effort Cardiovascular: Regular tachycardia, no murmurs, rubs, gallops GI: nondistended, nontender. No appreciable splenomegaly skin: No rash, skin intact Lymph: + Extensive anterior cervical LN.  No posterior cervical lymphadenopathy Musculoskeletal: no deformities Neurologic: Alert & oriented x 3, no focal neuro deficits Psychiatric: Speech  and behavior appropriate.  ED Course   Medications  dexamethasone (DECADRON) injection 10 mg (10 mg Intramuscular Given 12/28/19 1823)  penicillin g benzathine (BICILLIN LA) 1200000 UNIT/2ML injection 1.2 Million Units (1.2 Million Units Intramuscular Given 12/28/19 1823)    Orders Placed This Encounter  Procedures  . POCT rapid strep A [facility])    Standing Status:   Standing    Number of Occurrences:   1    No results found for this or any previous visit (from  the past 24 hour(s)). No results found.  ED Clinical Impression  1. Strep pharyngitis      ED Assessment/Plan  afebrile but took ibuprofen 400 mg mediately prior to arrival.  She is tachycardic.  Rapid strep positive.  Giving Bicillin 1,200,000 units IM x1 dexamethasone 10 mg IM.  Doubt epiglottitis, retropharyngeal abscess, peritonsillar abscess at this point time.  Sending home with ibuprofen 600 mg combined with 1000 milligrams of Tylenol 3-4 times a day as needed for pain Benadryl/Maalox, work note for Monday.  Patient declined Covid PCR.  Patient to followup with PMD of choice when necessary, will refer to local primary care resources.  Work note for Monday.  Has the weekend off.  Discussed labs,  MDM, plan and followup with patient. Discussed sn/sx that should prompt return to the ED. patient agrees with plan.   Meds ordered this encounter  Medications  . dexamethasone (DECADRON) injection 10 mg  . penicillin g benzathine (BICILLIN LA) 1200000 UNIT/2ML injection 1.2 Million Units    Order Specific Question:   Antibiotic Indication:    Answer:   Pharyngitis  . ibuprofen (ADVIL) 600 MG tablet    Sig: Take 1 tablet (600 mg total) by mouth every 6 (six) hours as needed.    Dispense:  30 tablet    Refill:  0    *This clinic note was created using Scientist, clinical (histocompatibility and immunogenetics). Therefore, there may be occasional mistakes despite careful proofreading.    Domenick Gong, MD 12/30/19 [phone]

## 2019-12-28 NOTE — ED Triage Notes (Signed)
Pt present sore throat with white patches in the back of her throat.  Symptoms started 2 days ago. Pt tonsil are swollen and inflamed.

## 2020-04-28 ENCOUNTER — Other Ambulatory Visit: Payer: Self-pay

## 2020-04-28 ENCOUNTER — Encounter (HOSPITAL_COMMUNITY): Payer: Self-pay | Admitting: Psychiatry

## 2020-04-28 ENCOUNTER — Telehealth (INDEPENDENT_AMBULATORY_CARE_PROVIDER_SITE_OTHER): Payer: Medicaid Other | Admitting: Psychiatry

## 2020-04-28 DIAGNOSIS — F3160 Bipolar disorder, current episode mixed, unspecified: Secondary | ICD-10-CM

## 2020-04-28 MED ORDER — QUETIAPINE FUMARATE 25 MG PO TABS: 25.0000 mg | ORAL_TABLET | Freq: Every day | ORAL | 1 refills | Status: DC

## 2020-04-28 MED ORDER — HYDROXYZINE HCL 10 MG PO TABS: 10.0000 mg | ORAL_TABLET | Freq: Three times a day (TID) | ORAL | 1 refills | Status: DC | PRN

## 2020-04-28 MED ORDER — LITHIUM CARBONATE ER 300 MG PO TBCR: 300.0000 mg | EXTENDED_RELEASE_TABLET | Freq: Two times a day (BID) | ORAL | 1 refills | Status: DC

## 2020-04-28 MED FILL — LITHIUM CARBONATE ER 300 MG: 300 | 30 days supply | Qty: 60 | Fill #0

## 2020-04-28 MED FILL — hydrOXYzine HCL 10 MG TABS: 10 | 30 days supply | Qty: 90 | Fill #0

## 2020-04-28 MED FILL — QUETIAPINE FUMARATE 25 MG T: 25 | 30 days supply | Qty: 30 | Fill #0
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
## 2020-05-05 ENCOUNTER — Ambulatory Visit (HOSPITAL_COMMUNITY): Payer: Federal, State, Local not specified - Other | Admitting: Licensed Clinical Social Worker

## 2020-05-11 ENCOUNTER — Other Ambulatory Visit: Payer: Self-pay

## 2020-05-11 ENCOUNTER — Encounter (HOSPITAL_COMMUNITY): Payer: Self-pay | Admitting: Psychiatry

## 2020-05-11 ENCOUNTER — Telehealth (INDEPENDENT_AMBULATORY_CARE_PROVIDER_SITE_OTHER): Payer: Medicaid Other | Admitting: Psychiatry

## 2020-05-11 DIAGNOSIS — F411 Generalized anxiety disorder: Secondary | ICD-10-CM | POA: Diagnosis not present

## 2020-05-11 DIAGNOSIS — F3160 Bipolar disorder, current episode mixed, unspecified: Secondary | ICD-10-CM | POA: Diagnosis not present

## 2020-05-11 NOTE — Progress Notes (Signed)
BH MD/PA/NP OP Progress Note Virtual Visit via Video Note  I connected with Kathy Bird on 05/12/20 at  4:15 PM EDT by a video enabled telemedicine application and verified that I am speaking with the correct person using two identifiers.  Location: Patient: Home Provider: Clinic   I discussed the limitations of evaluation and management by telemedicine and the availability of in person appointments. The patient expressed understanding and agreed to proceed.  I provided of non-face-to-face time during this encounter.    05/12/2020 9:47 AM Kathy Bird  MRN:  [id number]  Chief Complaint: "The Lithium seems to be working" HPI: 34 year old female seen today for follow-up psychiatric evaluation.   She has a psychiatric history of major depression, bipolar 1 disorder, opioid use disorder, and tobacco dependence.  She is currently being managed on lithium 300 mg twice daily, Seroquel 25 mg at bedtime, and hydroxyzine 10 mg 3 times a day.  Today she reports that she was unable to fill Seroquel and hydroxyzine however notes that lithium has been effective in managing her mood.  Today she reports that her mind is clear. She denies depressive symptoms such as insomnia, psychomotor agitation, feelings of guilt, and  difficult concentrating. She notes that her appetite has increased and at times she has loose stool. Patient encouraged to increase water intake to prevent dehydration and notify provider if her loose stool does not improve.   She notes that she still experiencing anxiety and has  difficulty sleeping. She denies SI/HI/VAH, mania, and paranoia.    Patient is agreeable to discontinue Seroquel 25 because she notes that she has not taken it and she is not experiencing symptoms of psychosis.  She is agreeable to starting trazodone 25-50 mg HS PRN to help improve sleep. She will continue all other medications as prescribed.  No other concerns noted at this time ,    Visit  Diagnosis:    ICD-10-CM   1. Generalized anxiety disorder  F41.1 hydrOXYzine (ATARAX/VISTARIL) 10 MG tablet  2. Bipolar I disorder, most recent episode mixed (HCC)  F31.60 lithium carbonate (LITHOBID) 300 MG CR tablet    traZODone (DESYREL) 50 MG tablet    DISCONTINUED: QUEtiapine (SEROQUEL) 25 MG tablet    Past Psychiatric History: major depression, bipolar 1 disorder, opioid use disorder, and tobacco dependence. Past Medical History:  Past Medical History:  Diagnosis Date  . Seizures (HCC)    as child. last seizure when 33yrs old    Past Surgical History:  Procedure Laterality Date  . ADENOIDECTOMY      Family Psychiatric History: Maternal cousin Bipolar, two maternal aunts schizophrenia  Family History: History reviewed. No pertinent family history.  Social History:  Social History   Socioeconomic History  . Marital status: Divorced    Spouse name: Not on file  . Number of children: Not on file  . Years of education: Not on file  . Highest education level: Not on file  Occupational History  . Not on file  Tobacco Use  . Smoking status: Current Every Day Smoker    Years: 5.00    Types: Cigarettes  . Smokeless tobacco: Never Used  Substance and Sexual Activity  . Alcohol use: Yes    Comment: socially  . Drug use: No  . Sexual activity: Yes    Birth control/protection: None    Comment: nuvaring  Other Topics Concern  . Not on file  Social History Narrative  . Not on file   Social Determinants of  Health   Financial Resource Strain:   . Difficulty of Paying Living Expenses:   Food Insecurity:   . Worried About Programme researcher, broadcasting/film/video in the Last Year:   . Barista in the Last Year:   Transportation Needs:   . Freight forwarder (Medical):   Marland Kitchen Lack of Transportation (Non-Medical):   Physical Activity:   . Days of Exercise per Week:   . Minutes of Exercise per Session:   Stress:   . Feeling of Stress :   Social Connections:   . Frequency of  Communication with Friends and Family:   . Frequency of Social Gatherings with Friends and Family:   . Attends Religious Services:   . Active Member of Clubs or Organizations:   . Attends Banker Meetings:   Marland Kitchen Marital Status:     Allergies:  Allergies  Allergen Reactions  . Peanut-Containing Drug Products Hives  . Shellfish Allergy Hives, Itching and Swelling    Tongue swelling also    Metabolic Disorder Labs: No results found for: HGBA1C, MPG No results found for: PROLACTIN No results found for: CHOL, TRIG, HDL, CHOLHDL, VLDL, LDLCALC Lab Results  Component Value Date   TSH 0.257 (L) 04/22/2019    Therapeutic Level Labs: No results found for: LITHIUM No results found for: VALPROATE No components found for:  CBMZ  Current Medications: Current Outpatient Medications  Medication Sig Dispense Refill  . hydrOXYzine (ATARAX/VISTARIL) 10 MG tablet Take 1 tablet (10 mg total) by mouth 3 (three) times daily as needed for anxiety. 90 tablet 1  . ibuprofen (ADVIL) 600 MG tablet Take 1 tablet (600 mg total) by mouth every 6 (six) hours as needed. (Patient not taking: Reported on 04/28/2020) 30 tablet 0  . lithium carbonate (LITHOBID) 300 MG CR tablet Take 1 tablet (300 mg total) by mouth 2 (two) times daily. 60 tablet 1  . ondansetron (ZOFRAN-ODT) 8 MG disintegrating tablet Take 1 tablet (8 mg total) by mouth every 8 (eight) hours as needed for nausea or vomiting. (Patient not taking: Reported on 04/28/2020) 20 tablet 0  . traZODone (DESYREL) 50 MG tablet Take 0.5 tablets (25 mg total) by mouth at bedtime. 30 tablet 1   No current facility-administered medications for this visit.     Musculoskeletal: Strength & Muscle Tone: within normal limits Gait & Station: normal Patient leans: N/A  Psychiatric Specialty Exam: Review of Systems  There were no vitals taken for this visit.There is no height or weight on file to calculate BMI.  General Appearance: Well Groomed   Eye Contact:  Good  Speech:  Normal Rate  Volume:  Normal  Mood:  Euthymic  Affect:  Congruent  Thought Process:  Coherent, Goal Directed and Linear  Orientation:  Full (Time, Place, and Person)  Thought Content: WDL and Logical   Suicidal Thoughts:  No  Homicidal Thoughts:  No  Memory:  Immediate;   Good Recent;   Good Remote;   Good  Judgement:  Good  Insight:  Good  Psychomotor Activity:  Normal  Concentration:  Concentration: Good and Attention Span: Good  Recall:  Good  Fund of Knowledge: Good  Language: Good  Akathisia:  No  Handed:  Right  AIMS (if indicated): Not done  Assets:  Communication Skills Desire for Improvement Financial Resources/Insurance Housing  ADL's:  Intact  Cognition: WNL  Sleep:  Fair   Screenings: AIMS     [facility]  AIMS Total Score 0       Assessment and Plan: Patient reports that she has seen an improvement in her mood with current dose of lithium.  She notes at times she is anxious.  She is agreeable to discontinuing Seroquel 25 mg due to not experiencing symptoms of psychosis.  She is also agreeable to starting trazodone 25 to 50 mg at bedtime as needed to help improve sleep as well as continuing hydroxyzine 10 mg 3 times a day to help with symptoms of anxiety.  1. Bipolar I disorder, most recent episode mixed (HCC)  Continue- lithium carbonate (LITHOBID) 300 MG CR tablet; Take 1 tablet (300 mg total) by mouth 2 (two) times daily.  Dispense: 60 tablet; Refill: 1 Start- traZODone (DESYREL) 50 MG tablet; Take 0.5 tablets (25 mg total) by mouth at bedtime.  Dispense: 30 tablet; Refill: 1  2. Generalized anxiety disorder Continue- hydrOXYzine (ATARAX/VISTARIL) 10 MG tablet; Take 1 tablet (10 mg total) by mouth 3 (three) times daily as needed for anxiety.  Dispense: 90 tablet; Refill: 1    Shanna Cisco, NP 05/12/2020, 9:47 AM

## 2020-05-12 DIAGNOSIS — F411 Generalized anxiety disorder: Secondary | ICD-10-CM | POA: Insufficient documentation

## 2020-05-12 MED ORDER — QUETIAPINE FUMARATE 25 MG PO TABS: 25.0000 mg | ORAL_TABLET | Freq: Every day | ORAL | 1 refills | Status: DC

## 2020-05-12 MED ORDER — HYDROXYZINE HCL 10 MG PO TABS: 10.0000 mg | ORAL_TABLET | Freq: Three times a day (TID) | ORAL | 1 refills | Status: DC | PRN

## 2020-05-12 MED ORDER — LITHIUM CARBONATE ER 300 MG PO TBCR: 300.0000 mg | EXTENDED_RELEASE_TABLET | Freq: Two times a day (BID) | ORAL | 1 refills | Status: DC

## 2020-05-12 MED ORDER — TRAZODONE HCL 50 MG PO TABS: 25.0000 mg | ORAL_TABLET | Freq: Every day | ORAL | 1 refills | Status: DC

## 2020-06-19 ENCOUNTER — Other Ambulatory Visit (HOSPITAL_COMMUNITY): Payer: Self-pay | Admitting: Psychiatry

## 2020-06-19 DIAGNOSIS — F3160 Bipolar disorder, current episode mixed, unspecified: Secondary | ICD-10-CM

## 2020-06-19 DIAGNOSIS — F411 Generalized anxiety disorder: Secondary | ICD-10-CM

## 2020-06-23 ENCOUNTER — Telehealth (HOSPITAL_COMMUNITY): Payer: Self-pay | Admitting: *Deleted

## 2020-06-23 NOTE — Telephone Encounter (Signed)
Medication request received from [facility] on [address] for Hydroxyzine and it was called in per provider.

## 2020-06-25 ENCOUNTER — Telehealth (INDEPENDENT_AMBULATORY_CARE_PROVIDER_SITE_OTHER): Payer: Medicaid Other | Admitting: Psychiatry

## 2020-06-25 ENCOUNTER — Encounter (HOSPITAL_COMMUNITY): Payer: Self-pay | Admitting: Psychiatry

## 2020-06-25 ENCOUNTER — Other Ambulatory Visit: Payer: Self-pay

## 2020-06-25 DIAGNOSIS — F411 Generalized anxiety disorder: Secondary | ICD-10-CM | POA: Diagnosis not present

## 2020-06-25 DIAGNOSIS — F3160 Bipolar disorder, current episode mixed, unspecified: Secondary | ICD-10-CM

## 2020-06-25 MED ORDER — QUETIAPINE FUMARATE 100 MG PO TABS: 100.0000 mg | ORAL_TABLET | Freq: Every day | ORAL | 2 refills | Status: DC

## 2020-06-25 MED ORDER — TRAZODONE HCL 50 MG PO TABS: 25.0000 mg | ORAL_TABLET | Freq: Every day | ORAL | 2 refills | Status: DC

## 2020-06-25 MED ORDER — LITHIUM CARBONATE ER 300 MG PO TBCR: 300.0000 mg | EXTENDED_RELEASE_TABLET | Freq: Two times a day (BID) | ORAL | 1 refills | Status: DC

## 2020-06-25 MED ORDER — HYDROXYZINE HCL 25 MG PO TABS: 25.0000 mg | ORAL_TABLET | Freq: Three times a day (TID) | ORAL | 2 refills | Status: DC | PRN

## 2020-06-25 NOTE — Progress Notes (Signed)
BH MD/PA/NP OP Progress Note Virtual Visit via Video Note  I connected with Kathy Bird on 06/25/20 at  4:30 PM EDT by a video enabled telemedicine application and verified that I am speaking with the correct person using two identifiers.  Location: Patient: Home Provider: Clinic   I discussed the limitations of evaluation and management by telemedicine and the availability of in person appointments. The patient expressed understanding and agreed to proceed.  I provided of non-face-to-face time during this encounter.    06/25/2020 6:02 PM Kathy Bird  MRN:  [id number]  Chief Complaint: "I have so much anxiety.  I have so much thought going my head."  HPI: 34 year old female seen today for follow-up psychiatric evaluation.   She has a psychiatric history of major depression, bipolar 1 disorder, opioid use disorder, and tobacco dependence.  She is currently being managed on lithium 300 mg twice daily, Seroquel 25 mg at bedtime, trazodone 25 mg-50 mg at bedtime and hydroxyzine 10 mg 3 times a day.  She notes that she did not discontinue Seroquel at her last visit and notes that her medications are somewhat effective in managing her psychiatric conditions. She however notes that she has been experiencing increased anxiety and symptoms of psychosis.  Today she notes that over the last few weeks she has been having severe anxiety and racing thoughts.  She notes that she cannot stop her mind from racing and reports that this is becoming severely overwhelming and stressful.  Patient reports that she has been hearing voices.  She notes that the voices in her head souond like her own voice  and reports that the voices are constantly arguing.  Patient also notes that she is extremely anxious.  She says that she frequently checks surroundings and counts to 3 to calm herself down.  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  She notes that she also experiencing tactile hallucinations reporting that she feels things crawling on her.  She notes that she constantly checks her hair and body parts to ensure that critters ar not on her.  She denies SI/HI.  Patient is agreeable to increasing Seroquel 25 mg to 100 mg to help manage mood and psychosis.  She notes that she is sleeping approximately 9 hours a night is agreeable to continuing trazodone as prescribed.  Patient is agreeable to increasing hydroxyzine 10 mg to 25 mg 3 times daily to help improve symptoms of anxiety. She is also agreeable to having her lithium levels checked. Her lithium dose may increased at next visit if her mood is not stabilized. She will continue all other medications as prescribed.  No other concerns noted at this time ,    Visit Diagnosis:    ICD-10-CM   1. Generalized anxiety disorder  F41.1 hydrOXYzine (ATARAX/VISTARIL) 25 MG tablet  2. Bipolar I disorder, most recent episode mixed (HCC)  F31.60 lithium carbonate (LITHOBID) 300 MG CR tablet    traZODone (DESYREL) 50 MG tablet    QUEtiapine (SEROQUEL) 100 MG tablet    Lithium level    Past Psychiatric History: major depression, bipolar 1 disorder, opioid use disorder, and tobacco dependence. Past Medical History:  Past Medical History:  Diagnosis Date  . Seizures (HCC)    as child. last seizure when 20yrs old    Past Surgical History:  Procedure Laterality Date  . ADENOIDECTOMY  Family Psychiatric History: Maternal cousin Bipolar, two maternal aunts schizophrenia  Family History: History reviewed. No pertinent family history.  Social History:  Social History   Socioeconomic History  . Marital status: Divorced    Spouse name: Not on file  . Number of children: Not on file  . Years of education: Not on file  . Highest education level: Not on file  Occupational History  . Not on file   Tobacco Use  . Smoking status: Current Every Day Smoker    Years: 5.00    Types: Cigarettes  . Smokeless tobacco: Never Used  Substance and Sexual Activity  . Alcohol use: Yes    Comment: socially  . Drug use: No  . Sexual activity: Yes    Birth control/protection: None    Comment: nuvaring  Other Topics Concern  . Not on file  Social History Narrative  . Not on file   Social Determinants of Health   Financial Resource Strain:   . Difficulty of Paying Living Expenses: Not on file  Food Insecurity:   . Worried About Programme researcher, broadcasting/film/video in the Last Year: Not on file  . Ran Out of Food in the Last Year: Not on file  Transportation Needs:   . Lack of Transportation (Medical): Not on file  . Lack of Transportation (Non-Medical): Not on file  Physical Activity:   . Days of Exercise per Week: Not on file  . Minutes of Exercise per Session: Not on file  Stress:   . Feeling of Stress : Not on file  Social Connections:   . Frequency of Communication with Friends and Family: Not on file  . Frequency of Social Gatherings with Friends and Family: Not on file  . Attends Religious Services: Not on file  . Active Member of Clubs or Organizations: Not on file  . Attends Banker Meetings: Not on file  . Marital Status: Not on file    Allergies:  Allergies  Allergen Reactions  . Peanut-Containing Drug Products Hives  . Shellfish Allergy Hives, Itching and Swelling    Tongue swelling also    Metabolic Disorder Labs: No results found for: HGBA1C, MPG No results found for: PROLACTIN No results found for: CHOL, TRIG, HDL, CHOLHDL, VLDL, LDLCALC Lab Results  Component Value Date   TSH 0.257 (L) 04/22/2019    Therapeutic Level Labs: No results found for: LITHIUM No results found for: VALPROATE No components found for:  CBMZ  Current Medications: Current Outpatient Medications  Medication Sig Dispense Refill  . hydrOXYzine (ATARAX/VISTARIL) 25 MG tablet Take 1  tablet (25 mg total) by mouth 3 (three) times daily as needed. for anxiety 90 tablet 2  . ibuprofen (ADVIL) 600 MG tablet Take 1 tablet (600 mg total) by mouth every 6 (six) hours as needed. (Patient not taking: Reported on 04/28/2020) 30 tablet 0  . lithium carbonate (LITHOBID) 300 MG CR tablet Take 1 tablet (300 mg total) by mouth 2 (two) times daily. 60 tablet 1  . ondansetron (ZOFRAN-ODT) 8 MG disintegrating tablet Take 1 tablet (8 mg total) by mouth every 8 (eight) hours as needed for nausea or vomiting. (Patient not taking: Reported on 04/28/2020) 20 tablet 0  . QUEtiapine (SEROQUEL) 100 MG tablet Take 1 tablet (100 mg total) by mouth at bedtime. 30 tablet 2  . traZODone (DESYREL) 50 MG tablet Take 0.5 tablets (25 mg total) by mouth at bedtime. 30 tablet 2   No current facility-administered medications for this visit.  Musculoskeletal: Strength & Muscle Tone: Unable to assess due to telehealth visit Gait & Station: Unable to assess due to telehealth visit Patient leans: N/A  Psychiatric Specialty Exam: Review of Systems  There were no vitals taken for this visit.There is no height or weight on file to calculate BMI.  General Appearance: Well Groomed  Eye Contact:  Good  Speech:  Normal Rate  Volume:  Normal  Mood:  Anxious and Depressed  Affect:  Congruent  Thought Process:  Coherent, Goal Directed and Linear  Orientation:  Full (Time, Place, and Person)  Thought Content: Logical and Hallucinations: Auditory Tactile   Suicidal Thoughts:  No  Homicidal Thoughts:  No  Memory:  Immediate;   Good Recent;   Good Remote;   Good  Judgement:  Good  Insight:  Good  Psychomotor Activity:  Normal  Concentration:  Concentration: Good and Attention Span: Good  Recall:  Good  Fund of Knowledge: Good  Language: Good  Akathisia:  No  Handed:  Right  AIMS (if indicated): Not done  Assets:  Communication Skills Desire for Improvement Financial Resources/Insurance Housing  ADL's:   Intact  Cognition: WNL  Sleep:  Good   Screenings: AIMS     [facility]  AIMS Total Score 0       Assessment and Plan: Patient endorses worsening anxiety, depression, and psychosis. She notes that she did not discontinue her Seroquel at last visit and is agreeable to increasing the dose to 100 mg HS to help stabilize mood and help manage sleep. She is also agreeable to having her lithium levels checked. Her lithium dose may increased at next visit if her mood is not stabilized. She is also agreeable to increase hydroxyzine 10 mg three times daily to 25 mg three times daily to help manage her anxiety. She will continue all other medications as prescribed.     1. Generalized anxiety disorder  Increased- hydrOXYzine (ATARAX/VISTARIL) 25 MG tablet; Take 1 tablet (25 mg total) by mouth 3 (three) times daily as needed. for anxiety  Dispense: 90 tablet; Refill: 2  2. Bipolar I disorder, most recent episode mixed (HCC)  Continue- lithium carbonate (LITHOBID) 300 MG CR tablet; Take 1 tablet (300 mg total) by mouth 2 (two) times daily.  Dispense: 60 tablet; Refill: 1 Continue- traZODone (DESYREL) 50 MG tablet; Take 0.5 tablets (25 mg total) by mouth at bedtime.  Dispense: 30 tablet; Refill: 2 Increased- QUEtiapine (SEROQUEL) 100 MG tablet; Take 1 tablet (100 mg total) by mouth at bedtime.  Dispense: 30 tablet; Refill: 2 - Lithium level  Follow up in 2 months    Shanna Cisco, NP 06/25/2020, 6:02 PM

## 2020-08-25 ENCOUNTER — Telehealth (INDEPENDENT_AMBULATORY_CARE_PROVIDER_SITE_OTHER): Payer: Medicaid Other | Admitting: Psychiatry

## 2020-08-25 ENCOUNTER — Encounter (HOSPITAL_COMMUNITY): Payer: Self-pay | Admitting: Psychiatry

## 2020-08-25 ENCOUNTER — Other Ambulatory Visit: Payer: Self-pay

## 2020-08-25 DIAGNOSIS — F3160 Bipolar disorder, current episode mixed, unspecified: Secondary | ICD-10-CM | POA: Diagnosis not present

## 2020-08-25 DIAGNOSIS — F900 Attention-deficit hyperactivity disorder, predominantly inattentive type: Secondary | ICD-10-CM

## 2020-08-25 DIAGNOSIS — F411 Generalized anxiety disorder: Secondary | ICD-10-CM | POA: Diagnosis not present

## 2020-08-25 MED ORDER — TRAZODONE HCL 50 MG PO TABS: 25.0000 mg | ORAL_TABLET | Freq: Every day | ORAL | 2 refills | Status: DC

## 2020-08-25 MED ORDER — QUETIAPINE FUMARATE 100 MG PO TABS: 100.0000 mg | ORAL_TABLET | Freq: Every day | ORAL | 2 refills | Status: DC

## 2020-08-25 MED ORDER — HYDROXYZINE HCL 25 MG PO TABS: 25.0000 mg | ORAL_TABLET | Freq: Three times a day (TID) | ORAL | 2 refills | Status: DC | PRN

## 2020-08-25 MED ORDER — LITHIUM CARBONATE ER 300 MG PO TBCR: 300.0000 mg | EXTENDED_RELEASE_TABLET | Freq: Two times a day (BID) | ORAL | 2 refills | Status: DC

## 2020-08-25 MED ORDER — ATOMOXETINE HCL 40 MG PO CAPS: 40.0000 mg | ORAL_CAPSULE | Freq: Every day | ORAL | 2 refills | Status: DC

## 2020-08-25 NOTE — Progress Notes (Signed)
BH MD/PA/NP OP Progress Note Virtual Visit via Video Note  I connected with Kathy Bird on 08/25/20 at  3:00 PM EDT by a video enabled telemedicine application and verified that I am speaking with the correct person using two identifiers.  Location: Patient: Home Provider: Clinic   I discussed the limitations of evaluation and management by telemedicine and the availability of in person appointments. The patient expressed understanding and agreed to proceed.  I provided of non-face-to-face time during this encounter.    08/25/2020 4:21 PM Kathy Bird  MRN:  [id number]  Chief Complaint: "My mom and I think I have ADHD."  HPI: 34 year old female seen today for follow up psychiatric evaluation. She has a psychiatric history of depression, anxiety, bipolar I, opioid use disorder and tobacco dependence. She is currently being managed on Lithium 300 mg twice daily , Seroquel 100mg  at night, Trazodone 25mg  at night, hydroxyzine 25mg  three times daily as needed. She notes her medications are effective in managing her psychiatric conditions however she notes that she has problems concentrating.  Today she is well groomed, pleasant, cooperative, engaged in conversation and maintained eye contact. She describes her mood anxious because she feels she has symptoms of ADHD.   Patient admits she is feeling much better but reports she thinks she has symptoms of ADHD. She reports she cannot control her thoughts and not able to focus on anything. She reports it is hard to pay attention to people when they are talking. She admits to being disorganized and forgetful. She denies avoiding tasks that are mentally taxing.   Patient notes that since starting Seroquel she does not have auditory or tactile hallucinations. She denies feeling depressed or anxious. A GAD-7 was completed today, and patient scored 8. A PHQ-9 was completed, and patient scored 17.  She endorses trouble sleeping because she is  waking up with racing thoughts but feels the Seroquel is helping.    Patient agreeable to start Strattera 40 mg to help manage symptoms of ADHD. Patient notes that she will have her lithium levels redrawn this Thursday. She will follow up with outpatient counselor for therapy and  continue all other medications as previously prescribed.  No other concerns noted at this time.    Visit Diagnosis:    ICD-10-CM   1. Attention deficit hyperactivity disorder (ADHD), predominantly inattentive type  F90.0 atomoxetine (STRATTERA) 40 MG capsule  2. Generalized anxiety disorder  F41.1 hydrOXYzine (ATARAX/VISTARIL) 25 MG tablet  3. Bipolar I disorder, most recent episode mixed (HCC)  F31.60 lithium carbonate (LITHOBID) 300 MG CR tablet    QUEtiapine (SEROQUEL) 100 MG tablet    traZODone (DESYREL) 50 MG tablet    Past Psychiatric History: major depression, bipolar 1 disorder, opioid use disorder, and tobacco dependence. Past Medical History:  Past Medical History:  Diagnosis Date  . Seizures (HCC)    as child. last seizure when 28yrs old    Past Surgical History:  Procedure Laterality Date  . ADENOIDECTOMY      Family Psychiatric History: Maternal cousin Bipolar, two maternal aunts schizophrenia  Family History: No family history on file.  Social History:  Social History   Socioeconomic History  . Marital status: Divorced    Spouse name: Not on file  . Number of children: Not on file  . Years of education: Not on file  . Highest education level: Not on file  Occupational History  . Not on file  Tobacco Use  . Smoking status: Current Every  Day Smoker    Years: 5.00    Types: Cigarettes  . Smokeless tobacco: Never Used  Substance and Sexual Activity  . Alcohol use: Yes    Comment: socially  . Drug use: No  . Sexual activity: Yes    Birth control/protection: None    Comment: nuvaring  Other Topics Concern  . Not on file  Social History Narrative  . Not on file   Social  Determinants of Health   Financial Resource Strain:   . Difficulty of Paying Living Expenses: Not on file  Food Insecurity:   . Worried About Programme researcher, broadcasting/film/video in the Last Year: Not on file  . Ran Out of Food in the Last Year: Not on file  Transportation Needs:   . Lack of Transportation (Medical): Not on file  . Lack of Transportation (Non-Medical): Not on file  Physical Activity:   . Days of Exercise per Week: Not on file  . Minutes of Exercise per Session: Not on file  Stress:   . Feeling of Stress : Not on file  Social Connections:   . Frequency of Communication with Friends and Family: Not on file  . Frequency of Social Gatherings with Friends and Family: Not on file  . Attends Religious Services: Not on file  . Active Member of Clubs or Organizations: Not on file  . Attends Banker Meetings: Not on file  . Marital Status: Not on file    Allergies:  Allergies  Allergen Reactions  . Peanut-Containing Drug Products Hives  . Shellfish Allergy Hives, Itching and Swelling    Tongue swelling also    Metabolic Disorder Labs: No results found for: HGBA1C, MPG No results found for: PROLACTIN No results found for: CHOL, TRIG, HDL, CHOLHDL, VLDL, LDLCALC Lab Results  Component Value Date   TSH 0.257 (L) 04/22/2019    Therapeutic Level Labs: No results found for: LITHIUM No results found for: VALPROATE No components found for:  CBMZ  Current Medications: Current Outpatient Medications  Medication Sig Dispense Refill  . atomoxetine (STRATTERA) 40 MG capsule Take 1 capsule (40 mg total) by mouth daily. 30 capsule 2  . hydrOXYzine (ATARAX/VISTARIL) 25 MG tablet Take 1 tablet (25 mg total) by mouth 3 (three) times daily as needed. for anxiety 90 tablet 2  . ibuprofen (ADVIL) 600 MG tablet Take 1 tablet (600 mg total) by mouth every 6 (six) hours as needed. (Patient not taking: Reported on 04/28/2020) 30 tablet 0  . lithium carbonate (LITHOBID) 300 MG CR tablet  Take 1 tablet (300 mg total) by mouth 2 (two) times daily. 60 tablet 2  . ondansetron (ZOFRAN-ODT) 8 MG disintegrating tablet Take 1 tablet (8 mg total) by mouth every 8 (eight) hours as needed for nausea or vomiting. (Patient not taking: Reported on 04/28/2020) 20 tablet 0  . QUEtiapine (SEROQUEL) 100 MG tablet Take 1 tablet (100 mg total) by mouth at bedtime. 30 tablet 2  . traZODone (DESYREL) 50 MG tablet Take 0.5 tablets (25 mg total) by mouth at bedtime. 30 tablet 2   No current facility-administered medications for this visit.     Musculoskeletal: Strength & Muscle Tone: Unable to assess due to telehealth visit Gait & Station: Unable to assess due to telehealth visit Patient leans: N/A  Psychiatric Specialty Exam: Review of Systems  There were no vitals taken for this visit.There is no height or weight on file to calculate BMI.  General Appearance: Well Groomed  Eye Contact:  Good  Speech:  Normal Rate  Volume:  Normal  Mood:  Depressed  Affect:  Congruent  Thought Process:  Coherent, Goal Directed and Linear  Orientation:  Full (Time, Place, and Person)  Thought Content: WDL and Logical   Suicidal Thoughts:  No  Homicidal Thoughts:  No  Memory:  Immediate;   Good Recent;   Good Remote;   Good  Judgement:  Good  Insight:  Good  Psychomotor Activity:  Normal  Concentration:  Concentration: Good and Attention Span: Good  Recall:  Good  Fund of Knowledge: Good  Language: Good  Akathisia:  No  Handed:  Right  AIMS (if indicated): Not done  Assets:  Communication Skills Desire for Improvement Financial Resources/Insurance Housing  ADL's:  Intact  Cognition: WNL  Sleep:  Good   Screenings: AIMS     [facility]  AIMS Total Score 0    GAD-7     Video Visit from 08/25/2020 in [facility]  Total GAD-7 Score 8    PHQ2-9     [facility]25/2020 in  [facility]  PHQ-2 Total Score 1  PHQ-9 Total Score 17       Assessment and Plan: Patient notes that his anxiety, depression, and psychosis has improved since last visit. She however notes that she has been having problems concentrating. She is agreeable to start Strattera 40 mg to help manage symptoms of ADHD. She will follow up with outpatient counselor for therapy and  continue all other medications as previously prescribed.  No other concerns noted at this time.     1. Generalized anxiety disorder  Continue- hydrOXYzine (ATARAX/VISTARIL) 25 MG tablet; Take 1 tablet (25 mg total) by mouth 3 (three) times daily as needed. for anxiety  Dispense: 90 tablet; Refill: 2  2. Bipolar I disorder, most recent episode mixed (HCC)  Continue- lithium carbonate (LITHOBID) 300 MG CR tablet; Take 1 tablet (300 mg total) by mouth 2 (two) times daily.  Dispense: 60 tablet; Refill: 2 Continue- QUEtiapine (SEROQUEL) 100 MG tablet; Take 1 tablet (100 mg total) by mouth at bedtime.  Dispense: 30 tablet; Refill: 2 Continue- traZODone (DESYREL) 50 MG tablet; Take 0.5 tablets (25 mg total) by mouth at bedtime.  Dispense: 30 tablet; Refill: 2  3. Attention deficit hyperactivity disorder (ADHD), predominantly inattentive type  Start t- atomoxetine (STRATTERA) 40 MG capsule; Take 1 capsule (40 mg total) by mouth daily.  Dispense: 30 capsule; Refill: 2       Follow up in 3 months    Shanna Cisco, NP 08/25/2020, 4:21 PM

## 2020-11-25 ENCOUNTER — Telehealth (HOSPITAL_COMMUNITY): Payer: Medicaid Other | Admitting: Psychiatry

## 2020-12-22 ENCOUNTER — Encounter (HOSPITAL_COMMUNITY): Payer: Self-pay | Admitting: Psychiatry

## 2020-12-22 ENCOUNTER — Other Ambulatory Visit: Payer: Self-pay

## 2020-12-22 ENCOUNTER — Telehealth (INDEPENDENT_AMBULATORY_CARE_PROVIDER_SITE_OTHER): Payer: 59 | Admitting: Psychiatry

## 2020-12-22 DIAGNOSIS — F900 Attention-deficit hyperactivity disorder, predominantly inattentive type: Secondary | ICD-10-CM

## 2020-12-22 DIAGNOSIS — F3160 Bipolar disorder, current episode mixed, unspecified: Secondary | ICD-10-CM

## 2020-12-22 DIAGNOSIS — F411 Generalized anxiety disorder: Secondary | ICD-10-CM | POA: Diagnosis not present

## 2020-12-22 MED ORDER — HYDROXYZINE HCL 25 MG PO TABS: 25.0000 mg | ORAL_TABLET | Freq: Three times a day (TID) | ORAL | 2 refills | Status: DC | PRN

## 2020-12-22 MED ORDER — TRAZODONE HCL 100 MG PO TABS: 100.0000 mg | ORAL_TABLET | Freq: Every day | ORAL | 2 refills | Status: DC

## 2020-12-22 MED ORDER — LITHIUM CARBONATE ER 300 MG PO TBCR: 300.0000 mg | EXTENDED_RELEASE_TABLET | Freq: Two times a day (BID) | ORAL | 2 refills | Status: DC

## 2020-12-22 MED ORDER — ATOMOXETINE HCL 40 MG PO CAPS: 40.0000 mg | ORAL_CAPSULE | Freq: Every day | ORAL | 2 refills | Status: DC

## 2020-12-22 MED ORDER — QUETIAPINE FUMARATE 200 MG PO TABS: 200.0000 mg | ORAL_TABLET | Freq: Every day | ORAL | 2 refills | Status: DC
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
## 2021-03-16 ENCOUNTER — Other Ambulatory Visit: Payer: Self-pay

## 2021-03-16 ENCOUNTER — Encounter (HOSPITAL_COMMUNITY): Payer: Self-pay | Admitting: Psychiatry

## 2021-03-16 ENCOUNTER — Telehealth (INDEPENDENT_AMBULATORY_CARE_PROVIDER_SITE_OTHER): Payer: 59 | Admitting: Psychiatry

## 2021-03-16 DIAGNOSIS — F3160 Bipolar disorder, current episode mixed, unspecified: Secondary | ICD-10-CM | POA: Diagnosis not present

## 2021-03-16 DIAGNOSIS — F411 Generalized anxiety disorder: Secondary | ICD-10-CM | POA: Diagnosis not present

## 2021-03-16 DIAGNOSIS — F900 Attention-deficit hyperactivity disorder, predominantly inattentive type: Secondary | ICD-10-CM | POA: Diagnosis not present

## 2021-03-16 MED ORDER — ATOMOXETINE HCL 25 MG PO CAPS: 25.0000 mg | ORAL_CAPSULE | Freq: Every day | ORAL | 2 refills | Status: DC

## 2021-03-16 MED ORDER — QUETIAPINE FUMARATE 200 MG PO TABS: 200.0000 mg | ORAL_TABLET | Freq: Every day | ORAL | 2 refills | Status: DC

## 2021-03-16 MED ORDER — LITHIUM CARBONATE ER 300 MG PO TBCR: 300.0000 mg | EXTENDED_RELEASE_TABLET | Freq: Two times a day (BID) | ORAL | 2 refills | Status: DC

## 2021-03-16 MED ORDER — TRAZODONE HCL 100 MG PO TABS: 100.0000 mg | ORAL_TABLET | Freq: Every day | ORAL | 2 refills | Status: DC

## 2021-03-16 MED ORDER — HYDROXYZINE HCL 25 MG PO TABS: 25.0000 mg | ORAL_TABLET | Freq: Three times a day (TID) | ORAL | 2 refills | Status: DC | PRN

## 2021-03-16 NOTE — Progress Notes (Signed)
BH MD/PA/NP OP Progress Note Virtual Visit via Telephone Note  I connected with Kathy Bird on 03/16/21 at  3:00 PM EDT by telephone and verified that I am speaking with the correct person using two identifiers.  Location: Patient: Work Provider: Clinic   I discussed the limitations, risks, security and privacy concerns of performing an evaluation and management service by telephone and the availability of in person appointments. I also discussed with the patient that there may be a patient responsible charge related to this service. The patient expressed understanding and agreed to proceed.   I provided 30 minutes of non-face-to-face time during this encounter.      03/16/2021 3:29 PM Kathy Bird  MRN:  [id number]  Chief Complaint: "The Strattera makes me nauseous but it work "  HPI: 35 year old female seen today for follow up psychiatric evaluation. She has a psychiatric history of depression, anxiety, bipolar I, opioid use disorder and tobacco dependence. She is currently being managed on Lithium 300 mg twice daily , Seroquel 200mg  at night, Trazodone 50-100 mg at night, hydroxyzine 25mg  three times daily as needed, and Strattera 40 mg daily. She notes her medications are effective in managing her psychiatric conditions however she notes Strattera makes her nauseous.   Today she was unable to login virtually so her assessment was done over the phone.  During assessment she was pleasant calm, cooperative, and engaged in conversation.  She informed provider that since her last visit her mood has been stable and notes that she does not have any anxiety or depression.  She reports that Strattera has been effective in managing her concentration however reports that sometimes it makes her nauseous.  Provider conducted a GAD-7 today and patient scored a 0, at her last visit she scored a 20.  Provider also conducted a PHQ-9 and patient scored a 0, at her last visit she scored a 20.  She endorses  adequate sleep and appetite.  She denies SI/HI/VH, mania, or paranoia.    Today she is agreeable to reducing Strattera 40 mg to 25 mg to help manage concentration and reduce symptoms of nausea.  She will continue all other medications as prescribed.  No other concerns noted at this time.    Visit Diagnosis:    ICD-10-CM   1. Attention deficit hyperactivity disorder (ADHD), predominantly inattentive type  F90.0 atomoxetine (STRATTERA) 25 MG capsule  2. Generalized anxiety disorder  F41.1 hydrOXYzine (ATARAX/VISTARIL) 25 MG tablet  3. Bipolar I disorder, most recent episode mixed (HCC)  F31.60 lithium carbonate (LITHOBID) 300 MG CR tablet    QUEtiapine (SEROQUEL) 200 MG tablet    traZODone (DESYREL) 100 MG tablet    Past Psychiatric History: major depression, bipolar 1 disorder, opioid use disorder, and tobacco dependence. Past Medical History:  Past Medical History:  Diagnosis Date  . Seizures (HCC)    as child. last seizure when 81yrs old    Past Surgical History:  Procedure Laterality Date  . ADENOIDECTOMY      Family Psychiatric History: Maternal cousin Bipolar, two maternal aunts schizophrenia  Family History: No family history on file.  Social History:  Social History   Socioeconomic History  . Marital status: Divorced    Spouse name: Not on file  . Number of children: Not on file  . Years of education: Not on file  . Highest education level: Not on file  Occupational History  . Not on file  Tobacco Use  . Smoking status: Current Every Day Smoker  Years: 5.00    Types: Cigarettes  . Smokeless tobacco: Never Used  Substance and Sexual Activity  . Alcohol use: Yes    Comment: socially  . Drug use: No  . Sexual activity: Yes    Birth control/protection: None    Comment: nuvaring  Other Topics Concern  . Not on file  Social History Narrative  . Not on file   Social Determinants of Health   Financial Resource Strain: Not on file  Food Insecurity: Not on  file  Transportation Needs: Not on file  Physical Activity: Not on file  Stress: Not on file  Social Connections: Not on file    Allergies:  Allergies  Allergen Reactions  . Peanut-Containing Drug Products Hives  . Shellfish Allergy Hives, Itching and Swelling    Tongue swelling also    Metabolic Disorder Labs: No results found for: HGBA1C, MPG No results found for: PROLACTIN No results found for: CHOL, TRIG, HDL, CHOLHDL, VLDL, LDLCALC Lab Results  Component Value Date   TSH 0.257 (L) 04/22/2019    Therapeutic Level Labs: No results found for: LITHIUM No results found for: VALPROATE No components found for:  CBMZ  Current Medications: Current Outpatient Medications  Medication Sig Dispense Refill  . atomoxetine (STRATTERA) 25 MG capsule Take 1 capsule (25 mg total) by mouth daily. 30 capsule 2  . hydrOXYzine (ATARAX/VISTARIL) 25 MG tablet Take 1 tablet (25 mg total) by mouth 3 (three) times daily as needed. for anxiety 90 tablet 2  . lithium carbonate (LITHOBID) 300 MG CR tablet Take 1 tablet (300 mg total) by mouth 2 (two) times daily. 60 tablet 2  . ondansetron (ZOFRAN-ODT) 8 MG disintegrating tablet Take 1 tablet (8 mg total) by mouth every 8 (eight) hours as needed for nausea or vomiting. (Patient not taking: Reported on 04/28/2020) 20 tablet 0  . QUEtiapine (SEROQUEL) 200 MG tablet Take 1 tablet (200 mg total) by mouth at bedtime. 30 tablet 2  . traZODone (DESYREL) 100 MG tablet Take 1 tablet (100 mg total) by mouth at bedtime. 30 tablet 2   No current facility-administered medications for this visit.     Musculoskeletal: Strength & Muscle Tone: Unable to assess due to telephone visit Gait & Station: unable to assess due to telephone visit Patient leans: N/A  Psychiatric Specialty Exam: Review of Systems  There were no vitals taken for this visit.There is no height or weight on file to calculate BMI.  General Appearance: unable to assess due to telephone visit   Eye Contact:  unable to assess due to telephone visit  Speech:  Clear and Coherent and Normal Rate  Volume:  Normal  Mood:  Euthymic  Affect:  Congruent  Thought Process:  Coherent, Goal Directed and Linear  Orientation:  Full (Time, Place, and Person)  Thought Content: WDL and Logical   Suicidal Thoughts:  No  Homicidal Thoughts:  No  Memory:  Immediate;   Good Recent;   Good Remote;   Good  Judgement:  Good  Insight:  Good  Psychomotor Activity:  Normal  Concentration:  Concentration: Good and Attention Span: Good  Recall:  Good  Fund of Knowledge: Good  Language: Good  Akathisia:  No  Handed:  Right  AIMS (if indicated): Not done  Assets:  Communication Skills Desire for Improvement Financial Resources/Insurance Housing  ADL's:  Intact  Cognition: WNL  Sleep:  Good   Screenings: AIMS   Flowsheet Row [facility]  AIMS Total Score 0    GAD-7   Flowsheet Row Video Visit from 03/16/2021 in [facility] Video Visit from 12/22/2020 in [facility] Video Visit from 08/25/2020 in [facility]  Total GAD-7 Score 0 20 8    PHQ2-9   Flowsheet Row Video Visit from 03/16/2021 in [facility] Video Visit from 12/22/2020 in [facility] Video Visit from 08/25/2020 in [facility]  PHQ-2 Total Score 0 5 1  PHQ-9 Total Score 0 20 17    Flowsheet Row Video Visit from 03/16/2021 in [facility] Video Visit from 12/22/2020 in [facility]Discharged[facility]  C-SSRS RISK CATEGORY No Risk No Risk High Risk       Assessment and Plan: Patient notes that she is doing well on her medication regimen however notes that at times she  becomes nauseous on Strattera.  Today she is agreeable to reducing Strattera 40 mg 25 mg to help manage symptoms of ADHD and reduce nausea.  She will continue all the medication as prescribed. 1. Attention deficit hyperactivity disorder (ADHD), predominantly inattentive type  Continue- atomoxetine (STRATTERA) 25MG  capsule; Take 1 capsule (25 mg total) by mouth daily.  Dispense: 30 capsule; Refill: 2  2. Generalized anxiety disorder  Continue- hydrOXYzine (ATARAX/VISTARIL) 25 MG tablet; Take 1 tablet (25 mg total) by mouth 3 (three) times daily as needed. for anxiety  Dispense: 90 tablet; Refill: 2  3. Bipolar I disorder, most recent episode mixed (HCC)  Continue- lithium carbonate (LITHOBID) 300 MG CR tablet; Take 1 tablet (300 mg total) by mouth 2 (two) times daily.  Dispense: 60 tablet; Refill: 2 Increased- QUEtiapine (SEROQUEL) 200 MG tablet; Take 1 tablet (200 mg total) by mouth at bedtime.  Dispense: 30 tablet; Refill: 2 Increased- traZODone (DESYREL) 100 MG tablet; Take 1 tablet (100 mg total) by mouth at bedtime.  Dispense: 30 tablet; Refill: 2   Follow up in 3 months    , NP 03/16/2021, 3:29 PM

## 2021-05-11 ENCOUNTER — Other Ambulatory Visit: Payer: Self-pay

## 2021-05-11 ENCOUNTER — Ambulatory Visit (INDEPENDENT_AMBULATORY_CARE_PROVIDER_SITE_OTHER): Payer: Managed Care, Other (non HMO)

## 2021-05-11 VITALS — BP 146/81 | HR 109 | Ht 64.0 in | Wt 134.0 lb

## 2021-05-11 DIAGNOSIS — Z3201 Encounter for pregnancy test, result positive: Secondary | ICD-10-CM

## 2021-05-11 LAB — POCT PREGNANCY, URINE: Preg Test, Ur: POSITIVE — AB

## 2021-05-11 NOTE — Progress Notes (Signed)
LMP 04/04/21 EDD 01/09/2022 [redacted]w[redacted]d  Pt here today for UPT. UPT was positive in office. Pt given results. Pt states took 1 pregnancy test at home that was positive. Pt denies any nausea, vomiting, vaginal bleeding or cramps//abd pain. Pt has not started PNV. Pt advised to start PNV. Pt had baby with [facility] in 2009, would like to come back here for care.   Pt has elevated BP today, states she is nervous. Denies any hx of HTN.   Pt requesting to have RTW and pregnancy verification letters today. Printed and given to pt.   Pt also advised to call and speak with behavorial health about current medication regimen. Pt verbalized understanding.   Pt to make new OB intake/new OB appt at front office today.   Judeth Cornfield, RN

## 2021-05-11 NOTE — Patient Instructions (Signed)
Prenatal Care Providers           [facility]  [address] [phone]  [facility]   [address]  [phone]  [facility] [place]       [address] [phone]            [facility] [place]     [address] [phone]          [facility] [place]   [address] [phone]  [facility]  [address] [phone]     [facility]  [address]   [phone]     [facility]  Phone: [phone]  [facility]  Phone: [phone]  [facility] Phone: [phone]  [facility] Phone: [phone]  [facility] Phone: [phone]  [facility] Phone: [phone]  [facility]  Phone: [phone] Safe Medications in Pregnancy   Acne: Benzoyl Peroxide Salicylic Acid  Backache/Headache: Tylenol: 2 regular strength every 4 hours OR              2 Extra strength every 6 hours  Colds/Coughs/Allergies: Benadryl (alcohol free) 25 mg every 6 hours as needed Breath right strips Claritin Cepacol throat lozenges Chloraseptic throat spray Cold-Eeze- up to three times per day Cough drops, alcohol free Flonase (by prescription only) Guaifenesin Mucinex Robitussin DM (plain only, alcohol free) Saline nasal spray/drops Sudafed (pseudoephedrine) & Actifed ** use only after [redacted] weeks gestation and if you do not have high blood pressure Tylenol Vicks Vaporub Zinc lozenges Zyrtec   Constipation: Colace Ducolax suppositories Fleet enema Glycerin suppositories Metamucil Milk of magnesia Miralax Senokot Smooth move tea  Diarrhea: Kaopectate Imodium A-D  *NO pepto  Bismol  Hemorrhoids: Anusol Anusol HC Preparation H Tucks  Indigestion: Tums Maalox Mylanta Zantac  Pepcid  Insomnia: Benadryl (alcohol free) 25mg every 6 hours as needed Tylenol PM Unisom, no Gelcaps  Leg Cramps: Tums MagGel  Nausea/Vomiting:  Bonine Dramamine Emetrol Ginger extract Sea bands Meclizine  Nausea medication to take during pregnancy:  Unisom (doxylamine succinate 25 mg tablets) Take one tablet daily at bedtime. If symptoms are not adequately controlled, the dose can be increased to a maximum recommended dose of two tablets daily (1/2 tablet in the morning, 1/2 tablet mid-afternoon and one at bedtime). Vitamin B6 100mg tablets. Take one tablet twice a day (up to 200 mg per day).  Skin Rashes: Aveeno products Benadryl cream or 25mg every 6 hours as needed Calamine Lotion 1% cortisone cream  Yeast infection: Gyne-lotrimin 7 Monistat 7   **If taking multiple medications, please check labels to avoid duplicating the same active ingredients **take medication as directed on the label ** Do not exceed 4000 mg of tylenol in 24 hours **Do not take medications that contain aspirin or ibuprofen    

## 2021-05-18 NOTE — Progress Notes (Signed)
Chart reviewed for nurse visit. Agree with plan of care.   Marylene Land, CNM 05/18/2021 5:11 PM

## 2021-05-18 NOTE — Progress Notes (Signed)
.  cwhrn °

## 2021-05-21 ENCOUNTER — Telehealth: Payer: Self-pay | Admitting: Family Medicine

## 2021-05-21 NOTE — Telephone Encounter (Signed)
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

## 2021-05-21 NOTE — Telephone Encounter (Signed)
Per chart review had pregnancy verified in our office and has a new ob visit scheduled.  I called Jameah back and informed her that their is a weight restriction of 20 pounds. I informed her there is a letter I can send her in MyChart of the restrictions she can give to her work. She requested letter be sent and voices understanding Merit Gadsby,RN

## 2021-06-07 ENCOUNTER — Telehealth: Payer: Self-pay

## 2021-06-07 ENCOUNTER — Inpatient Hospital Stay (HOSPITAL_COMMUNITY)
Admission: AD | Admit: 2021-06-07 | Discharge: 2021-06-08 | Disposition: A | Discharge status: Home / Self Care | Payer: Managed Care, Other (non HMO) | Attending: Obstetrics and Gynecology | Admitting: Obstetrics and Gynecology

## 2021-06-07 ENCOUNTER — Encounter (HOSPITAL_COMMUNITY): Payer: Self-pay | Admitting: Family Medicine

## 2021-06-07 ENCOUNTER — Inpatient Hospital Stay (HOSPITAL_COMMUNITY): Payer: Managed Care, Other (non HMO)

## 2021-06-07 DIAGNOSIS — Z3A09 9 weeks gestation of pregnancy: Secondary | ICD-10-CM

## 2021-06-07 DIAGNOSIS — O99011 Anemia complicating pregnancy, first trimester: Secondary | ICD-10-CM | POA: Diagnosis not present

## 2021-06-07 DIAGNOSIS — O034 Incomplete spontaneous abortion without complication: Secondary | ICD-10-CM | POA: Diagnosis not present

## 2021-06-07 DIAGNOSIS — O209 Hemorrhage in early pregnancy, unspecified: Secondary | ICD-10-CM | POA: Diagnosis not present

## 2021-06-07 DIAGNOSIS — O039 Complete or unspecified spontaneous abortion without complication: Secondary | ICD-10-CM

## 2021-06-07 DIAGNOSIS — D509 Iron deficiency anemia, unspecified: Secondary | ICD-10-CM | POA: Diagnosis not present

## 2021-06-07 DIAGNOSIS — O4690 Antepartum hemorrhage, unspecified, unspecified trimester: Secondary | ICD-10-CM

## 2021-06-07 HISTORY — DX: Anxiety disorder, unspecified: F41.9

## 2021-06-07 HISTORY — DX: Depression, unspecified: F32.A

## 2021-06-07 HISTORY — DX: Gestational (pregnancy-induced) hypertension without significant proteinuria, unspecified trimester: O13.9

## 2021-06-07 LAB — COMPREHENSIVE METABOLIC PANEL
ALT: 12 U/L (ref 0–44)
AST: 15 U/L (ref 15–41)
Albumin: 3.5 g/dL (ref 3.5–5.0)
Alkaline Phosphatase: 42 U/L (ref 38–126)
Anion gap: 8 (ref 5–15)
BUN: 12 mg/dL (ref 6–20)
CO2: 21 mmol/L — ABNORMAL LOW (ref 22–32)
Calcium: 8.8 mg/dL — ABNORMAL LOW (ref 8.9–10.3)
Chloride: 106 mmol/L (ref 98–111)
Creatinine, Ser: 0.59 mg/dL (ref 0.44–1.00)
GFR, Estimated: >60 mL/min (ref >60)
Glucose, Bld: 99 mg/dL (ref 70–99)
Potassium: 3.2 mmol/L — ABNORMAL LOW (ref 3.5–5.1)
Sodium: 135 mmol/L (ref 135–145)
Total Bilirubin: 0.3 mg/dL (ref 0.3–1.2)
Total Protein: 7.2 g/dL (ref 6.5–8.1)

## 2021-06-07 LAB — CBC WITH DIFFERENTIAL/PLATELET
Abs Immature Granulocytes: 0.01 10*3/uL (ref 0.00–0.07)
Basophils Absolute: 0 10*3/uL (ref 0.0–0.1)
Basophils Relative: 0 %
Eosinophils Absolute: 0.1 10*3/uL (ref 0.0–0.5)
Eosinophils Relative: 1 %
HCT: 26.4 % — ABNORMAL LOW (ref 36.0–46.0)
Hemoglobin: 7.4 g/dL — ABNORMAL LOW (ref 12.0–15.0)
Immature Granulocytes: 0 %
Lymphocytes Relative: 24 %
Lymphs Abs: 1.8 10*3/uL (ref 0.7–4.0)
MCH: 19.9 pg — ABNORMAL LOW (ref 26.0–34.0)
MCHC: 28 g/dL — ABNORMAL LOW (ref 30.0–36.0)
MCV: 71.2 fL — ABNORMAL LOW (ref 80.0–100.0)
Monocytes Absolute: 0.6 10*3/uL (ref 0.1–1.0)
Monocytes Relative: 8 %
Neutro Abs: 5.1 10*3/uL (ref 1.7–7.7)
Neutrophils Relative %: 67 %
Platelets: 322 10*3/uL (ref 150–400)
RBC: 3.71 MIL/uL — ABNORMAL LOW (ref 3.87–5.11)
RDW: 19.8 % — ABNORMAL HIGH (ref 11.5–15.5)
WBC: 7.7 10*3/uL (ref 4.0–10.5)
nRBC: 0 % (ref 0.0–0.2)

## 2021-06-07 LAB — HCG, QUANTITATIVE, PREGNANCY: hCG, Beta Chain, Quant, S: 10428 m[IU]/mL — ABNORMAL HIGH (ref <5)

## 2021-06-07 LAB — PREPARE RBC (CROSSMATCH)

## 2021-06-07 MED ORDER — SODIUM CHLORIDE 0.9 % IV SOLN: INTRAVENOUS | Status: DC

## 2021-06-07 MED ORDER — ACETAMINOPHEN 325 MG PO TABS
650.0000 mg | ORAL_TABLET | Freq: Once | ORAL | Status: AC
  Administered 2021-06-08: 650 mg via ORAL
  Filled 2021-06-07: qty 2

## 2021-06-07 MED ORDER — IBUPROFEN 600 MG PO TABS: 600.0000 mg | ORAL_TABLET | Freq: Four times a day (QID) | ORAL | 0 refills | Status: DC | PRN

## 2021-06-07 MED ORDER — IBUPROFEN 600 MG PO TABS
600.0000 mg | ORAL_TABLET | Freq: Once | ORAL | Status: AC
  Administered 2021-06-07: 600 mg via ORAL
  Filled 2021-06-07: qty 1

## 2021-06-07 MED ORDER — SODIUM CHLORIDE 0.9% IV SOLUTION
Freq: Once | INTRAVENOUS | Status: AC
Start: 2021-06-07 — End: 2021-06-08

## 2021-06-07 MED ORDER — DIPHENHYDRAMINE HCL 25 MG PO CAPS
25.0000 mg | ORAL_CAPSULE | Freq: Once | ORAL | Status: AC
  Administered 2021-06-08: 25 mg via ORAL
  Filled 2021-06-07: qty 1

## 2021-06-07 NOTE — MAU Provider Note (Signed)
History     CSN: [id number]  Arrival date and time: 8/8/[id number]   Event Date/Time   First Provider Initiated Contact with Patient 06/07/21 2018      Chief Complaint  Patient presents with   Vaginal Bleeding   Abdominal Pain   Ms. MEKENZIE MODESTE is a 35 y.o. year old [id number] female at [redacted]w[redacted]d weeks gestation who presents to MAU reporting heavy vaginal bleeding and passing blood clots the size of lemons. She was unsure if the clots contained anything that appeared like tissue. She had her pregnancy confirmed at [facility], but no U/S to verify location of pregnancy. When she called the office today, she was instructed to come to MAU for ectopic evaluation. She denies any recent SI. She has a h/o anemia. She reports feeling weak, having H/As, randomly seeing spots before eyes, feeling like going to pass out, and SOB.   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 last seizure when 89yrs old    Past Surgical History:  Procedure Laterality Date   ADENOIDECTOMY      Family History  Problem Relation Age of Onset   Healthy Mother    Healthy Father     Social History   Tobacco Use   Smoking status: Every Day    Packs/day: 0.25    Years: 8.00    Pack years: 2.00    Types: Cigarettes   Smokeless tobacco: Never  Vaping Use   Vaping Use: Never used  Substance Use Topics   Alcohol use: Yes    Alcohol/week: 3.0 standard drinks    Types: 3 Cans of beer per week   Drug use: No    Allergies:  Allergies  Allergen Reactions   Peanut-Containing Drug Products Hives   Shellfish Allergy Hives, Itching and Swelling    Tongue swelling also    Medications Prior to  Admission  Medication Sig Dispense Refill Last Dose   acetaminophen (TYLENOL) 500 MG tablet Take 1,000 mg by mouth every 6 (six) hours as needed.   06/07/2021 at 0600   Aspirin-Salicylamide-Caffeine (ARTHRITIS STRENGTH BC POWDER PO) Take by mouth.   06/07/2021 at 1500   HYDROcodone-acetaminophen (NORCO/VICODIN) 5-325 MG tablet Take 1 tablet by mouth every 6 (six) hours as needed for moderate pain.   06/07/2021 at 1000   naproxen sodium (ALEVE) 220 MG tablet Take 440 mg by mouth.   06/07/2021 at 1130   QUEtiapine (SEROQUEL) 200 MG tablet Take 1 tablet (200 mg total) by mouth at bedtime. 30 tablet 2 Past Week   atomoxetine (STRATTERA) 25 MG capsule Take 1 capsule (25 mg total) by mouth daily. (Patient not taking: No sig reported) 30 capsule 2    hydrOXYzine (ATARAX/VISTARIL) 25 MG tablet Take 1 tablet (25 mg total) by mouth 3 (three) times daily as needed. for anxiety 90 tablet 2    lithium carbonate (LITHOBID) 300 MG  CR tablet Take 1 tablet (300 mg total) by mouth 2 (two) times daily. 60 tablet 2    ondansetron (ZOFRAN-ODT) 8 MG disintegrating tablet Take 1 tablet (8 mg total) by mouth every 8 (eight) hours as needed for nausea or vomiting. (Patient not taking: No sig reported) 20 tablet 0    traZODone (DESYREL) 100 MG tablet Take 1 tablet (100 mg total) by mouth at bedtime. 30 tablet 2     Review of Systems  Constitutional:  Positive for fatigue.  HENT: Negative.    Eyes:  Positive for visual disturbance (occ. spots before eyes).  Respiratory:  Positive for shortness of breath (occ).   Cardiovascular: Negative.   Gastrointestinal: Negative.   Endocrine: Negative.   Genitourinary:  Positive for vaginal bleeding (heavy, passing blood clots the size of lemons).  Musculoskeletal: Negative.   Skin: Negative.   Allergic/Immunologic: Negative.   Neurological:  Positive for dizziness (occ), weakness and light-headedness ("felt like passing out a few times").  Hematological: Negative.    Psychiatric/Behavioral: Negative.    Physical Exam   Blood pressure 110/65, pulse 83, temperature 98.7 F (37.1 C), temperature source Oral, resp. rate 15, height 5\' 4"  (1.626 m), weight 61.2 kg, last menstrual period 04/04/2021, SpO2 100 %.  Physical Exam Vitals and nursing note reviewed. Exam conducted with a chaperone present.  Constitutional:      Appearance: Normal appearance. She is normal weight.  Cardiovascular:     Rate and Rhythm: Normal rate.  Pulmonary:     Effort: Pulmonary effort is normal.  Abdominal:     General: Abdomen is flat.     Palpations: Abdomen is soft.  Genitourinary:    General: Normal vulva.     Comments: Pelvic exam: External genitalia normal, SE: vaginal walls pink and well rugated, cervix is smooth, pink, no lesions, small amt of dark, red blood with possible POC in vaginal vault -- WP, GC/CT done, cervix closed/firm/long, Uterus is non-tender, no CMT or friability, no adnexal tenderness.  Musculoskeletal:        General: Normal range of motion.  Skin:    General: Skin is warm and dry.  Neurological:     Mental Status: She is alert and oriented to person, place, and time.  Psychiatric:        Mood and Affect: Mood normal.        Behavior: Behavior normal.        Thought Content: Thought content normal.        Judgment: Judgment normal.    MAU Course  Procedures  MDM [facility] UPT CBC ABO/Rh HCG HIV -- pending OB < 14 wks 06/04/2021 with TV Insert IV 0.9% NS 1000 mL @ 150 ml/hr Transfuse 1 unit of PRBCs TC from [place], RN @ [phone] to report critical value HgB = 6.5 Transfuse an additional 1 unit of PRBCs  VB minimal TC from 2683, RN @ [phone] to report HgB = 7.9 after 2nd unit of PRBCs and request Ibuprofen 600 mg order; pain rated 5-6/10  VB remains minimal 0715: RN notified CNM that patient requested "something stronger" for pain as she was being d/c'd.   *Consult with Dr. 08-09-1974 @ 2045 - notified of patient's complaints,  assessments, lab & U/S results, recommended tx plan transfuse 1 unit of RBCs  *Consult with Dr. 2046 @ [phone] - notified of patient's complaints, assessments, and lab results, recommended tx plan transfuse 1 additional unit of RBCs   Results for orders placed or performed during  the hospital encounter of 06/07/21 (from the past 24 hour(s))  CBC with Differential/Platelet     Status: Abnormal   Collection Time: 06/07/21  6:19 PM  Result Value Ref Range   WBC 7.7 4.0 - 10.5 K/uL   RBC 3.71 (L) 3.87 - 5.11 MIL/uL   Hemoglobin 7.4 (L) 12.0 - 15.0 g/dL   HCT 62.6 (L) 94.8 - 54.6 %   MCV 71.2 (L) 80.0 - 100.0 fL   MCH 19.9 (L) 26.0 - 34.0 pg   MCHC 28.0 (L) 30.0 - 36.0 g/dL   RDW 27.0 (H) 35.0 - 09.3 %   Platelets 322 150 - 400 K/uL   nRBC 0.0 0.0 - 0.2 %   Neutrophils Relative % 67 %   Neutro Abs 5.1 1.7 - 7.7 K/uL   Lymphocytes Relative 24 %   Lymphs Abs 1.8 0.7 - 4.0 K/uL   Monocytes Relative 8 %   Monocytes Absolute 0.6 0.1 - 1.0 K/uL   Eosinophils Relative 1 %   Eosinophils Absolute 0.1 0.0 - 0.5 K/uL   Basophils Relative 0 %   Basophils Absolute 0.0 0.0 - 0.1 K/uL   Immature Granulocytes 0 %   Abs Immature Granulocytes 0.01 0.00 - 0.07 K/uL  Comprehensive metabolic panel     Status: Abnormal   Collection Time: 06/07/21  6:19 PM  Result Value Ref Range   Sodium 135 135 - 145 mmol/L   Potassium 3.2 (L) 3.5 - 5.1 mmol/L   Chloride 106 98 - 111 mmol/L   CO2 21 (L) 22 - 32 mmol/L   Glucose, Bld 99 70 - 99 mg/dL   BUN 12 6 - 20 mg/dL   Creatinine, Ser 8.18 0.44 - 1.00 mg/dL   Calcium 8.8 (L) 8.9 - 10.3 mg/dL   Total Protein 7.2 6.5 - 8.1 g/dL   Albumin 3.5 3.5 - 5.0 g/dL   AST 15 15 - 41 U/L   ALT 12 0 - 44 U/L   Alkaline Phosphatase 42 38 - 126 U/L   Total Bilirubin 0.3 0.3 - 1.2 mg/dL   GFR, Estimated >29 >93 mL/min   Anion gap 8 5 - 15  hCG, quantitative, pregnancy     Status: Abnormal   Collection Time: 06/07/21  6:19 PM  Result Value Ref Range   hCG, Beta Chain,  Quant, S 10,428 (H) <5 mIU/mL  Prepare RBC (crossmatch)     Status: None   Collection Time: 06/07/21  8:35 PM  Result Value Ref Range   Order Confirmation      ORDERS RECEIVED TO CROSSMATCH Performed at [facility], [address], [place], [place] [address]   Type and screen     Status: None (Preliminary result)   Collection Time: 06/07/21  8:52 PM  Result Value Ref Range   ABO/RH(D) O POS    Antibody Screen POS    Sample Expiration 06/10/2021,2359    Antibody Identification ANTI LEA Melvyn Neth a)    PT AG Type NEGATIVE FOR LEWIS A ANTIGEN    Unit Number [id number]    Blood Component Type RED CELLS,LR    Unit division 00    Status of Unit ISSUED    Donor AG Type NEGATIVE FOR LEWIS A ANTIGEN    Transfusion Status OK TO TRANSFUSE    Crossmatch Result COMPATIBLE    Unit Number [id number]    Blood Component Type RED CELLS,LR    Unit division 00    Status of Unit ISSUED    Donor  AG Type NEGATIVE FOR LEWIS A ANTIGEN    Transfusion Status OK TO TRANSFUSE    Crossmatch Result COMPATIBLE    Unit Number [id number]    Blood Component Type RED CELLS,LR    Unit division 00    Status of Unit ALLOCATED    Donor AG Type NEGATIVE FOR LEWIS A ANTIGEN    Transfusion Status OK TO TRANSFUSE    Crossmatch Result      COMPATIBLE Performed at [facility], [address], [place], [place] [address]   CBC with Differential     Status: Abnormal   Collection Time: 06/08/21  3:08 AM  Result Value Ref Range   WBC 5.2 4.0 - 10.5 K/uL   RBC 3.27 (L) 3.87 - 5.11 MIL/uL   Hemoglobin 6.5 (LL) 12.0 - 15.0 g/dL   HCT 33.8 (L) 25.0 - 53.9 %   MCV 72.2 (L) 80.0 - 100.0 fL   MCH 19.9 (L) 26.0 - 34.0 pg   MCHC 27.5 (L) 30.0 - 36.0 g/dL   RDW 76.7 (H) 34.1 - 93.7 %   Platelets 260 150 - 400 K/uL   nRBC 0.0 0.0 - 0.2 %   Neutrophils Relative % 54 %   Neutro Abs 2.8 1.7 - 7.7 K/uL   Lymphocytes Relative 34 %   Lymphs Abs 1.7 0.7 - 4.0 K/uL   Monocytes Relative 10 %   Monocytes  Absolute 0.5 0.1 - 1.0 K/uL   Eosinophils Relative 2 %   Eosinophils Absolute 0.1 0.0 - 0.5 K/uL   Basophils Relative 0 %   Basophils Absolute 0.0 0.0 - 0.1 K/uL   Immature Granulocytes 0 %   Abs Immature Granulocytes 0.01 0.00 - 0.07 K/uL  CBC with Differential     Status: Abnormal   Collection Time: 06/08/21  5:33 AM  Result Value Ref Range   WBC 5.0 4.0 - 10.5 K/uL   RBC 3.69 (L) 3.87 - 5.11 MIL/uL   Hemoglobin 7.9 (L) 12.0 - 15.0 g/dL   HCT 90.2 (L) 40.9 - 73.5 %   MCV 74.3 (L) 80.0 - 100.0 fL   MCH 21.4 (L) 26.0 - 34.0 pg   MCHC 28.8 (L) 30.0 - 36.0 g/dL   RDW 32.9 (H) 92.4 - 26.8 %   Platelets 264 150 - 400 K/uL   nRBC 0.0 0.0 - 0.2 %   Neutrophils Relative % 54 %   Neutro Abs 2.7 1.7 - 7.7 K/uL   Lymphocytes Relative 33 %   Lymphs Abs 1.7 0.7 - 4.0 K/uL   Monocytes Relative 10 %   Monocytes Absolute 0.5 0.1 - 1.0 K/uL   Eosinophils Relative 3 %   Eosinophils Absolute 0.1 0.0 - 0.5 K/uL   Basophils Relative 0 %   Basophils Absolute 0.0 0.0 - 0.1 K/uL   Immature Granulocytes 0 %   Abs Immature Granulocytes 0.01 0.00 - 0.07 K/uL    US OB LESS THAN 14 WEEKS WITH OB TRANSVAGINAL  Result Date: 06/07/2021 CLINICAL DATA:  Vaginal bleeding EXAM: OBSTETRIC <14 WK [place] AND TRANSVAGINAL OB US TECHNIQUE: Both transabdominal and transvaginal ultrasound examinations were performed for complete evaluation of the gestation as well as the maternal uterus, adnexal regions, and pelvic cul-de-sac. Transvaginal technique was performed to assess early pregnancy. COMPARISON:  None. FINDINGS: Intrauterine gestational sac: Absent Maternal uterus/adnexae: Uterus appears within normal limits. Ovaries appear within normal limits. No free fluid is noted. IMPRESSION: No evidence of intrauterine gestation. Follow-up imaging can be performed as clinically indicated.  Electronically Signed   By: Alcide CleverMark  Lukens M.D.   On: 06/07/2021 19:39      Assessment and Plan  Miscarriage - Plan: Discharge patient -  Information provided on miscarriage - Rx for Oxycodone 5 mg every 4 hrs prn pain x 3 days - Rx for Ibuprofen 600 mg every 6 hrs prn pain  Vaginal bleeding during pregnancy  - Return to MAU: If you have heavier bleeding that soaks through more that 2 pads per hour for an hour or more If you bleed so much that you feel like you might pass out or you do pass out If you have significant abdominal pain that is not improved with Tylenol 1000 mg every 6 hours as needed for pain If you develop a fever > 100.5   Iron deficiency anemia, unspecified iron deficiency anemia type  - Rx for FeSO4 325 mg QOD - Rx for Vitamin C 500 mg QOD with iron - Information provided on iron rich diet  [redacted] weeks gestation of pregnancy   - Discharge patient  - [facility] - Patient verbalized an understanding of the plan of care and agrees.   Raelyn Moraolitta Kaitlen Redford, CNM 06/07/2021, 8:36 PM

## 2021-06-07 NOTE — MAU Note (Signed)
Pt reports heavy vag bleeding and cramping passing large clots size of lemon.  Feels weak.

## 2021-06-07 NOTE — MAU Note (Signed)
Bleeding started yesterday, getting worse.

## 2021-06-07 NOTE — Telephone Encounter (Signed)
Pt left VM on nurse line stating she believes she is having a miscarriage and would like to know if she needs an appt or can be at home. Per chart review, positive UPT in office, no confirmed IUP. Called pt. Pt reports passing clots the size of lemons and cramping. Explained pt needs to go to MAU for ectopic vs miscarriage evaluation. Explained ectopic pregnancy can be life threatening without treatment. Pt verbalizes understanding and intent to present to MAU for evaluation.

## 2021-06-08 DIAGNOSIS — Z3A09 9 weeks gestation of pregnancy: Secondary | ICD-10-CM | POA: Diagnosis not present

## 2021-06-08 DIAGNOSIS — O209 Hemorrhage in early pregnancy, unspecified: Secondary | ICD-10-CM

## 2021-06-08 DIAGNOSIS — O99011 Anemia complicating pregnancy, first trimester: Secondary | ICD-10-CM

## 2021-06-08 DIAGNOSIS — O034 Incomplete spontaneous abortion without complication: Secondary | ICD-10-CM | POA: Diagnosis not present

## 2021-06-08 DIAGNOSIS — D509 Iron deficiency anemia, unspecified: Secondary | ICD-10-CM

## 2021-06-08 LAB — CBC WITH DIFFERENTIAL/PLATELET
Abs Immature Granulocytes: 0.01 10*3/uL (ref 0.00–0.07)
Abs Immature Granulocytes: 0.01 10*3/uL (ref 0.00–0.07)
Basophils Absolute: 0 10*3/uL (ref 0.0–0.1)
Basophils Absolute: 0 10*3/uL (ref 0.0–0.1)
Basophils Relative: 0 %
Basophils Relative: 0 %
Eosinophils Absolute: 0.1 10*3/uL (ref 0.0–0.5)
Eosinophils Absolute: 0.1 10*3/uL (ref 0.0–0.5)
Eosinophils Relative: 2 %
Eosinophils Relative: 3 %
HCT: 23.6 % — ABNORMAL LOW (ref 36.0–46.0)
HCT: 27.4 % — ABNORMAL LOW (ref 36.0–46.0)
Hemoglobin: 6.5 g/dL — CL (ref 12.0–15.0)
Hemoglobin: 7.9 g/dL — ABNORMAL LOW (ref 12.0–15.0)
Immature Granulocytes: 0 %
Immature Granulocytes: 0 %
Lymphocytes Relative: 34 %
Lymphocytes Relative: 33 %
Lymphs Abs: 1.7 10*3/uL (ref 0.7–4.0)
Lymphs Abs: 1.7 10*3/uL (ref 0.7–4.0)
MCH: 19.9 pg — ABNORMAL LOW (ref 26.0–34.0)
MCH: 21.4 pg — ABNORMAL LOW (ref 26.0–34.0)
MCHC: 27.5 g/dL — ABNORMAL LOW (ref 30.0–36.0)
MCHC: 28.8 g/dL — ABNORMAL LOW (ref 30.0–36.0)
MCV: 72.2 fL — ABNORMAL LOW (ref 80.0–100.0)
MCV: 74.3 fL — ABNORMAL LOW (ref 80.0–100.0)
Monocytes Absolute: 0.5 10*3/uL (ref 0.1–1.0)
Monocytes Absolute: 0.5 10*3/uL (ref 0.1–1.0)
Monocytes Relative: 10 %
Monocytes Relative: 10 %
Neutro Abs: 2.8 10*3/uL (ref 1.7–7.7)
Neutro Abs: 2.7 10*3/uL (ref 1.7–7.7)
Neutrophils Relative %: 54 %
Neutrophils Relative %: 54 %
Platelets: 260 10*3/uL (ref 150–400)
Platelets: 264 10*3/uL (ref 150–400)
RBC: 3.27 MIL/uL — ABNORMAL LOW (ref 3.87–5.11)
RBC: 3.69 MIL/uL — ABNORMAL LOW (ref 3.87–5.11)
RDW: 19.4 % — ABNORMAL HIGH (ref 11.5–15.5)
RDW: 20.5 % — ABNORMAL HIGH (ref 11.5–15.5)
WBC: 5.2 10*3/uL (ref 4.0–10.5)
WBC: 5 10*3/uL (ref 4.0–10.5)
nRBC: 0 % (ref 0.0–0.2)
nRBC: 0 % (ref 0.0–0.2)

## 2021-06-08 LAB — OB RESULTS CONSOLE PLATELET COUNT: Platelets: 264

## 2021-06-08 LAB — OB RESULTS CONSOLE HGB/HCT, BLOOD
HCT: 27 — AB (ref 29–41)
Hemoglobin: 7.9

## 2021-06-08 MED ORDER — FERROUS SULFATE 325 (65 FE) MG PO TBEC: 325.0000 mg | DELAYED_RELEASE_TABLET | ORAL | 3 refills | Status: DC

## 2021-06-08 MED ORDER — SODIUM CHLORIDE 0.9% IV SOLUTION: Freq: Once | INTRAVENOUS | Status: DC

## 2021-06-08 MED ORDER — OXYCODONE HCL 5 MG PO TABS: 5.0000 mg | ORAL_TABLET | ORAL | 0 refills | Status: AC | PRN

## 2021-06-08 MED ORDER — VITAMIN C 250 MG PO TABS: 250.0000 mg | ORAL_TABLET | ORAL | 3 refills | Status: DC

## 2021-06-08 MED ORDER — IBUPROFEN 600 MG PO TABS
600.0000 mg | ORAL_TABLET | Freq: Once | ORAL | Status: AC
  Administered 2021-06-08: 600 mg via ORAL
  Filled 2021-06-08: qty 1

## 2021-06-08 NOTE — MAU Note (Signed)
CRITICAL VALUE STICKER  CRITICAL VALUE: Hgb 6.5  RECEIVER (on-site recipient of call): Ginnie Smart, RN  DATE & TIME NOTIFIED: 06/08/21 0336  MESSENGER (representative from lab):Candace Madilyn Fireman  MD NOTIFIED: Raelyn Mora, CNM  TIME OF NOTIFICATION:0337  RESPONSE:  Ordered 2nd unit of blood

## 2021-06-08 NOTE — Discharge Instructions (Signed)
Return to MAU:  If you have heavier bleeding that soaks through more that 2 pads per hour for an hour or more  If you bleed so much that you feel like you might pass out or you do pass out  If you have significant abdominal pain that is not improved with Tylenol 1000 mg every 6 hours as needed for pain  If you develop a fever > 100.5   

## 2021-06-09 LAB — TYPE AND SCREEN
ABO/RH(D): O POS
Antibody Screen: POSITIVE
Donor AG Type: NEGATIVE
Donor AG Type: NEGATIVE
Donor AG Type: NEGATIVE
PT AG Type: NEGATIVE
Unit division: 0
Unit division: 0
Unit division: 0

## 2021-06-09 LAB — BPAM RBC
Blood Product Expiration Date: 202209102359
Blood Product Expiration Date: 202209102359
Blood Product Expiration Date: 202209102359
ISSUE DATE / TIME: 202208090016
ISSUE DATE / TIME: 202208090348
Unit Type and Rh: 5100
Unit Type and Rh: 5100
Unit Type and Rh: 5100

## 2021-06-09 LAB — SURGICAL PATHOLOGY

## 2021-06-15 ENCOUNTER — Encounter: Payer: Managed Care, Other (non HMO) | Admitting: Family Medicine

## 2021-06-16 ENCOUNTER — Encounter (HOSPITAL_COMMUNITY): Payer: Self-pay | Admitting: Psychiatry

## 2021-06-16 ENCOUNTER — Telehealth (INDEPENDENT_AMBULATORY_CARE_PROVIDER_SITE_OTHER): Payer: 59 | Admitting: Psychiatry

## 2021-06-16 ENCOUNTER — Other Ambulatory Visit: Payer: Self-pay

## 2021-06-16 DIAGNOSIS — F411 Generalized anxiety disorder: Secondary | ICD-10-CM | POA: Diagnosis not present

## 2021-06-16 DIAGNOSIS — F3160 Bipolar disorder, current episode mixed, unspecified: Secondary | ICD-10-CM | POA: Diagnosis not present

## 2021-06-16 MED ORDER — QUETIAPINE FUMARATE 200 MG PO TABS: 200.0000 mg | ORAL_TABLET | Freq: Every day | ORAL | 2 refills | Status: DC

## 2021-06-16 MED ORDER — TRAZODONE HCL 100 MG PO TABS: 100.0000 mg | ORAL_TABLET | Freq: Every day | ORAL | 2 refills | Status: DC

## 2021-06-16 MED ORDER — HYDROXYZINE HCL 25 MG PO TABS: 25.0000 mg | ORAL_TABLET | Freq: Three times a day (TID) | ORAL | 2 refills | Status: DC | PRN

## 2021-06-16 MED ORDER — LITHIUM CARBONATE ER 300 MG PO TBCR: 300.0000 mg | EXTENDED_RELEASE_TABLET | Freq: Two times a day (BID) | ORAL | 2 refills | Status: DC

## 2021-06-16 NOTE — Progress Notes (Signed)
BH MD/PA/NP OP Progress Note Virtual Visit via Telephone Note  I connected with Kathy Bird on 06/16/21 at  3:00 PM EDT by telephone and verified that I am speaking with the correct person using two identifiers.  Location: Patient: Work Provider: Clinic   I discussed the limitations, risks, security and privacy concerns of performing an evaluation and management service by telephone and the availability of in person appointments. I also discussed with the patient that there may be a patient responsible charge related to this service. The patient expressed understanding and agreed to proceed.   I provided 30 minutes of non-face-to-face time during this encounter.      06/16/2021 11:46 AM Kathy Bird  MRN:  [id number]  Chief Complaint: "Patient notes that she discontinued Strattera"  HPI: 35 year old female seen today for follow up psychiatric evaluation. She has a psychiatric history of depression, anxiety, bipolar I, opioid use disorder and tobacco dependence. She is currently being managed on Lithium 300 mg twice daily , Seroquel 200mg  at night, Trazodone 50-100 mg at night, hydroxyzine 25mg  three times daily as needed, and Strattera 40 mg daily. She notes she discontinued Strattera.    Today she was unable to login virtually so her assessment was done over the phone.  During assessment she was pleasant calm, cooperative, and engaged in conversation.  She informed that she discontinued Strattera because it made her feel nauseous.  She notes that did help her concentrate.  She asked writer if there was other options for ADHD.  Provider informed patient that there were other option however after reviewing patient's chart provider noticed that patient recently had a miscarriage.  She informed provider that she has been feeling weak, has low energy, and has increased anxiety and depression.  She also notes that she has been having abdominal and back pain.  Provider informed patient that  before another medication is prescribed she should be checked out by her OB/GYN or her PCP she endorsed understanding and agreed.   Patient notes that since her miscarriage she has been more anxious and depressed.  Provider conducted a GAD-7 and patient scored a 7, at her last visit she scored a 0.  Provider also conducted a PHQ-9 and patient scored a 12, at her last visit she scored a 0.  She notes that she sleeps well however reports that her appetite has been decreased.  Today she denies SI/HI/VAH, mania, or paranoia.  She notes that she would like to discontinue Strattera.  At this time no other medications adjusted.  Patient will follow up with OB/GYN to assess physical comorbidities.  Provider will reassess patient in 3 months and potentially fill another nonstimulant such as Intuniv or clonidine for ADHD symptoms.  No other concerns noted at this time.   Visit Diagnosis:    ICD-10-CM   1. Generalized anxiety disorder  F41.1 hydrOXYzine (ATARAX/VISTARIL) 25 MG tablet    2. Bipolar I disorder, most recent episode mixed (HCC)  F31.60 lithium carbonate (LITHOBID) 300 MG CR tablet    QUEtiapine (SEROQUEL) 200 MG tablet    traZODone (DESYREL) 100 MG tablet      Past Psychiatric History: major depression, bipolar 1 disorder, opioid use disorder, and tobacco dependence. Past Medical History:  Past Medical History:  Diagnosis Date   Anxiety    Depression    Pregnancy induced hypertension    Seizures (HCC)    as child. last seizure when 73yrs old    Past Surgical History:  Procedure Laterality  Date   ADENOIDECTOMY      Family Psychiatric History: Maternal cousin Bipolar, two maternal aunts schizophrenia  Family History:  Family History  Problem Relation Age of Onset   Healthy Mother    Healthy Father     Social History:  Social History   Socioeconomic History   Marital status: Divorced    Spouse name: Not on file   Number of children: Not on file   Years of education: Not  on file   Highest education level: Not on file  Occupational History   Not on file  Tobacco Use   Smoking status: Every Day    Packs/day: 0.25    Years: 8.00    Pack years: 2.00    Types: Cigarettes   Smokeless tobacco: Never  Vaping Use   Vaping Use: Never used  Substance and Sexual Activity   Alcohol use: Yes    Alcohol/week: 3.0 standard drinks    Types: 3 Cans of beer per week   Drug use: No   Sexual activity: Yes    Birth control/protection: None  Other Topics Concern   Not on file  Social History Narrative   Not on file   Social Determinants of Health   Financial Resource Strain: Not on file  Food Insecurity: Not on file  Transportation Needs: Not on file  Physical Activity: Not on file  Stress: Not on file  Social Connections: Not on file    Allergies:  Allergies  Allergen Reactions   Peanut-Containing Drug Products Hives   Shellfish Allergy Hives, Itching and Swelling    Tongue swelling also    Metabolic Disorder Labs: No results found for: HGBA1C, MPG No results found for: PROLACTIN No results found for: CHOL, TRIG, HDL, CHOLHDL, VLDL, LDLCALC Lab Results  Component Value Date   TSH 0.257 (L) 04/22/2019    Therapeutic Level Labs: No results found for: LITHIUM No results found for: VALPROATE No components found for:  CBMZ  Current Medications: Current Outpatient Medications  Medication Sig Dispense Refill   acetaminophen (TYLENOL) 500 MG tablet Take 1,000 mg by mouth every 6 (six) hours as needed.     ferrous sulfate 325 (65 FE) MG EC tablet Take 1 tablet (325 mg total) by mouth every other day. Take with Vitamin C pills 30 tablet 3   hydrOXYzine (ATARAX/VISTARIL) 25 MG tablet Take 1 tablet (25 mg total) by mouth 3 (three) times daily as needed. for anxiety 90 tablet 2   ibuprofen (ADVIL) 600 MG tablet Take 1 tablet (600 mg total) by mouth every 6 (six) hours as needed. 60 tablet 0   lithium carbonate (LITHOBID) 300 MG CR tablet Take 1 tablet  (300 mg total) by mouth 2 (two) times daily. 60 tablet 2   naproxen sodium (ALEVE) 220 MG tablet Take 440 mg by mouth.     QUEtiapine (SEROQUEL) 200 MG tablet Take 1 tablet (200 mg total) by mouth at bedtime. 30 tablet 2   traZODone (DESYREL) 100 MG tablet Take 1 tablet (100 mg total) by mouth at bedtime. 30 tablet 2   vitamin C (ASCORBIC ACID) 250 MG tablet Take 1 tablet (250 mg total) by mouth every other day. Take with iron pills 30 tablet 3   No current facility-administered medications for this visit.     Musculoskeletal: Strength & Muscle Tone:  Unable to assess due to telephone visit Gait & Station:  unable to assess due to telephone visit Patient leans: N/A  Psychiatric Specialty Exam: Review of  Systems  Last menstrual period 04/04/2021.There is no height or weight on file to calculate BMI.  General Appearance:  unable to assess due to telephone visit  Eye Contact:   unable to assess due to telephone visit  Speech:  Clear and Coherent and Normal Rate  Volume:  Normal  Mood:  Anxious and Depressed  Affect:  Congruent  Thought Process:  Coherent, Goal Directed and Linear  Orientation:  Full (Time, Place, and Person)  Thought Content: WDL and Logical   Suicidal Thoughts:  No  Homicidal Thoughts:  No  Memory:  Immediate;   Good Recent;   Good Remote;   Good  Judgement:  Good  Insight:  Good  Psychomotor Activity:  Normal  Concentration:  Concentration: Good and Attention Span: Good  Recall:  Good  Fund of Knowledge: Good  Language: Good  Akathisia:  No  Handed:  Right  AIMS (if indicated): Not done  Assets:  Communication Skills Desire for Improvement Financial Resources/Insurance Housing  ADL's:  Intact  Cognition: WNL  Sleep:  Good   Screenings: AIMS    Flowsheet Row [facility]  AIMS Total Score 0      GAD-7    Flowsheet Row [facility]  Total GAD-7 Score 7 0 20 8      PHQ2-9    Flowsheet Row [facility]  PHQ-2 Total Score 2 0 5 1  PHQ-9 Total Score 12 0 20 17      Flowsheet Row [facility] Ad[facility] (Discharged) from 06/07/2021 in [facility]  C-SSRS RISK CATEGORY No Risk No Risk No Risk        Assessment and Plan: Patient notes that she has had increased anxiety and depression since miscarrying.  She also informed Clinical research associatewriter that she found Strattera effective in managing her ADHD however notes that it made her too nauseous. She notes that she would like to discontinue Strattera at this time.  At this time no other medications adjusted.  Patient will follow up with OB/GYN to assess physical comorbidities.  Provider will reassess patient in 3 months and potentially fill another nonstimulant such as Intuniv or clonidine for ADHD symptoms.  1. Generalized anxiety disorder  Continue- hydrOXYzine (ATARAX/VISTARIL) 25 MG tablet; Take 1 tablet (25 mg total) by mouth 3 (three) times daily as needed. for anxiety  Dispense: 90 tablet; Refill: 2  2. Bipolar I disorder, most recent episode mixed (HCC)  Continue- lithium carbonate (LITHOBID) 300 MG CR tablet; Take 1 tablet (300 mg total) by mouth 2 (two) times daily.  Dispense: 60 tablet; Refill: 2 Continue- QUEtiapine (SEROQUEL) 200 MG  tablet; Take 1 tablet (200 mg total) by mouth at bedtime.  Dispense: 30 tablet; Refill: 2 Continue- traZODone (DESYREL) 100 MG tablet; Take 1 tablet (100 mg total) by mouth at bedtime.  Dispense: 30 tablet; Refill: 2   Follow up in 3 months  Shanna Cisco, NP 06/16/2021, 11:46 AM

## 2021-09-16 ENCOUNTER — Telehealth (HOSPITAL_COMMUNITY): Payer: Managed Care, Other (non HMO) | Admitting: Psychiatry

## 2021-10-31 NOTE — L&D Delivery Note (Signed)
OB/GYN Faculty Practice Delivery Note  Kathy Bird is a 36 y.o. [id number] s/p NSVD at [redacted]w[redacted]d. She was admitted for elective IOL at term.   ROM: 11h 13m with clear fluid GBS Status:  Negative/-- (05/23 1014) Maximum Maternal Temperature: 51 F    Labor Progress: Patient arrived at 2 cm dilation and was induced with FB. Upon placement of FB there was inadvertent AROM. She was given a dose of cytotec but subsequently needed terbutaline due to tachysystole. Baby recovered well and once FB came out she was started on pitocin. She progressed to complete and had uncomplicated NSVD.   Delivery Date/Time: 04/07/2022 at 2119 Delivery: Called to room and patient was complete and pushing. Head delivered in OA position. No nuchal cord present. Shoulder and body delivered in usual fashion. Infant with spontaneous cry, placed on mother's abdomen, dried and stimulated. Cord clamped x 2 after 1-minute delay, and cut by baby's grandmother. Cord blood drawn. Placenta delivered spontaneously with gentle cord traction. Fundus firm with massage and Pitocin. Labia, perineum, vagina, and cervix inspected with 2nd degree laceration repaired in usual fashion with 3-0 vicryl rapide.   Placenta: 3v intact to L&D Complications: none Lacerations: 2nd degree EBL: 350 cc Analgesia: epidural, local   Infant: Baby girl  APGAR (1 MIN):  8 APGAR (5 MINS):  9 APGAR (10 MINS):    Weight: 3680 grams  Venora Maples, MD/MPH Attending Family Medicine Physician, [facility] for [facility], [facility]

## 2021-11-17 DIAGNOSIS — F1191 Opioid use, unspecified, in remission: Secondary | ICD-10-CM | POA: Insufficient documentation

## 2021-11-17 DIAGNOSIS — F1110 Opioid abuse, uncomplicated: Secondary | ICD-10-CM | POA: Insufficient documentation

## 2021-11-22 ENCOUNTER — Inpatient Hospital Stay (HOSPITAL_COMMUNITY)
Admission: AD | Admit: 2021-11-22 | Discharge: 2021-11-22 | Disposition: A | Discharge status: Home / Self Care | Payer: Managed Care, Other (non HMO) | Attending: Family Medicine | Admitting: Family Medicine

## 2021-11-22 ENCOUNTER — Inpatient Hospital Stay (HOSPITAL_BASED_OUTPATIENT_CLINIC_OR_DEPARTMENT_OTHER): Payer: Managed Care, Other (non HMO)

## 2021-11-22 ENCOUNTER — Encounter (HOSPITAL_COMMUNITY): Payer: Self-pay | Admitting: Family Medicine

## 2021-11-22 DIAGNOSIS — O99891 Other specified diseases and conditions complicating pregnancy: Secondary | ICD-10-CM

## 2021-11-22 DIAGNOSIS — R109 Unspecified abdominal pain: Secondary | ICD-10-CM

## 2021-11-22 DIAGNOSIS — O26892 Other specified pregnancy related conditions, second trimester: Secondary | ICD-10-CM | POA: Insufficient documentation

## 2021-11-22 DIAGNOSIS — F17210 Nicotine dependence, cigarettes, uncomplicated: Secondary | ICD-10-CM | POA: Insufficient documentation

## 2021-11-22 DIAGNOSIS — M549 Dorsalgia, unspecified: Secondary | ICD-10-CM | POA: Diagnosis not present

## 2021-11-22 DIAGNOSIS — R102 Pelvic and perineal pain: Secondary | ICD-10-CM | POA: Diagnosis not present

## 2021-11-22 DIAGNOSIS — O4442 Low lying placenta NOS or without hemorrhage, second trimester: Secondary | ICD-10-CM | POA: Insufficient documentation

## 2021-11-22 DIAGNOSIS — N949 Unspecified condition associated with female genital organs and menstrual cycle: Secondary | ICD-10-CM

## 2021-11-22 DIAGNOSIS — O4440 Low lying placenta NOS or without hemorrhage, unspecified trimester: Secondary | ICD-10-CM

## 2021-11-22 DIAGNOSIS — O0932 Supervision of pregnancy with insufficient antenatal care, second trimester: Secondary | ICD-10-CM | POA: Insufficient documentation

## 2021-11-22 DIAGNOSIS — O99332 Smoking (tobacco) complicating pregnancy, second trimester: Secondary | ICD-10-CM | POA: Insufficient documentation

## 2021-11-22 DIAGNOSIS — Z3A19 19 weeks gestation of pregnancy: Secondary | ICD-10-CM | POA: Diagnosis not present

## 2021-11-22 DIAGNOSIS — Z3A33 33 weeks gestation of pregnancy: Secondary | ICD-10-CM

## 2021-11-22 LAB — URINALYSIS, ROUTINE W REFLEX MICROSCOPIC
Bilirubin Urine: NEGATIVE
Glucose, UA: NEGATIVE mg/dL
Ketones, ur: NEGATIVE mg/dL
Nitrite: NEGATIVE
Protein, ur: 30 mg/dL — AB
RBC / HPF: >50 RBC/hpf — ABNORMAL HIGH (ref 0–5)
Specific Gravity, Urine: 1.029 (ref 1.005–1.030)
pH: 5 (ref 5.0–8.0)

## 2021-11-22 MED ORDER — ACETAMINOPHEN 325 MG PO TABS
650.0000 mg | ORAL_TABLET | Freq: Once | ORAL | Status: AC
  Administered 2021-11-22: 650 mg via ORAL
  Filled 2021-11-22: qty 2

## 2021-11-22 NOTE — MAU Provider Note (Signed)
History     CSN: [id number]  Arrival date and time: 11/22/21 1054   Event Date/Time   First Provider Initiated Contact with Patient 11/22/21 1223       Chief Complaint  Patient presents with   Abdominal Pain   HPI This is a 36 year old G4 P1-0-2-1 with unknown LMP who presents with back pain and lower abdominal pain.  Patient did not realize that she was pregnant.  She had an injury at work and thought she pulled her back muscle.  Physician went to her home, had her take a urine pregnancy test, which was positive.  Her back pain is on the left side, worse with ambulation and going up stairs.  Improved with rest.  No other palliating or provoking factors.  Patient is now feeling fetal movement. She has been taking BC powder, Aleve, ibuprofen, and some medicine that a friend had given her.  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 last seizure when 45yrs old    Past Surgical History:  Procedure Laterality Date   ADENOIDECTOMY      Family History  Problem Relation Age of Onset   Healthy Mother     Social History   Tobacco Use   Smoking status: Every Day    Packs/day: 0.25    Years: 8.00    Pack years: 2.00    Types: Cigarettes   Smokeless tobacco: Never  Vaping Use   Vaping Use: Never used  Substance Use Topics   Alcohol use: Not Currently    Alcohol/week: 3.0 standard drinks    Types: 3 Cans of beer per week    Comment: Social   Drug use: Not Currently    Types: Other-see comments    Comment: No Rx drugs since 11/18/2021    Allergies:  Allergies  Allergen Reactions   Peanut-Containing Drug Products Hives   Shellfish Allergy Hives,  Itching and Swelling    Tongue swelling also    Medications Prior to Admission  Medication Sig Dispense Refill Last Dose   ibuprofen (ADVIL) 600 MG tablet Take 1 tablet (600 mg total) by mouth every 6 (six) hours as needed. 60 tablet 0 11/22/2021 at 0930   acetaminophen (TYLENOL) 500 MG tablet Take 1,000 mg by mouth every 6 (six) hours as needed.   More than a month   ferrous sulfate 325 (65 FE) MG EC tablet Take 1 tablet (325 mg total) by mouth every other day. Take with Vitamin C pills 30 tablet 3    hydrOXYzine (ATARAX/VISTARIL) 25 MG tablet Take 1 tablet (25 mg total) by mouth 3 (three) times daily as needed. for anxiety 90 tablet 2    lithium carbonate (LITHOBID) 300 MG CR tablet Take 1 tablet (300 mg total) by mouth 2 (two) times daily. 60 tablet 2    naproxen sodium (ALEVE) 220 MG tablet Take 440 mg by mouth.  11/20/2021   QUEtiapine (SEROQUEL) 200 MG tablet Take 1 tablet (200 mg total) by mouth at bedtime. 30 tablet 2    traZODone (DESYREL) 100 MG tablet Take 1 tablet (100 mg total) by mouth at bedtime. (Patient taking differently: Take by mouth at bedtime.) 30 tablet 2    vitamin C (ASCORBIC ACID) 250 MG tablet Take 1 tablet (250 mg total) by mouth every other day. Take with iron pills 30 tablet 3     Review of Systems Physical Exam   Blood pressure 112/61, pulse 85, temperature 98 F (36.7 C), temperature source Oral, resp. rate 17, height 5' 4.5" (1.638 m), weight 63.8 kg, last menstrual period 04/04/2021, SpO2 100 %.  Physical Exam Vitals and nursing note reviewed.  Constitutional:      Appearance: She is well-developed.  HENT:     Head: Normocephalic and atraumatic.  Abdominal:     General: Abdomen is flat. Bowel sounds are normal.     Palpations: Abdomen is soft.     Tenderness: There is no right CVA tenderness, left CVA tenderness, guarding or rebound.     Comments: Fundal height measuring approximately 26 to 27 cm.  Tenderness along the round ligaments   Musculoskeletal:       Arms:  Skin:    General: Skin is warm and dry.     Capillary Refill: Capillary refill takes less than 2 seconds.  Neurological:     General: No focal deficit present.     Mental Status: She is alert.  Psychiatric:        Mood and Affect: Mood normal.        Behavior: Behavior normal.    MAU Course  Procedures  MDM Imaging: I independent reviewed the images of the [place]. Femur length measuring [redacted]w[redacted]d. Placenta low lying. CL 4cm.  Tylenol given  Assessment and Plan   1. [redacted] weeks gestation of pregnancy   2. Abdominal pain   3. Round ligament pain   4. Low-lying placenta   5. Back pain affecting pregnancy in second trimester    Patient improved after tylenol. Discharge to home.  Precautions about low lying placenta given.  Levie Heritage 11/22/2021, 12:23 PM

## 2021-11-22 NOTE — MAU Note (Signed)
Having pain in her stomach, on left side- mid to LLQ. Been going on a few days.  Thought she had pulled something at work.  Had a dr to come out to the house, found she was pregnant(+preg test).  Has been worse since then. Constant, esp when she stands. No bleeding or loss of fluid. Aware of movement now.   Unsure of LMP, states had a period in Oct that lasted all month.

## 2021-11-30 ENCOUNTER — Telehealth: Payer: Self-pay | Admitting: Internal Medicine

## 2021-11-30 NOTE — Telephone Encounter (Signed)
Scheduled appt per 1/31 referral. Spoke to pt who is aware of appt date and time. Pt is aware to arrive 15 mins prior to appt time.  °

## 2021-12-08 ENCOUNTER — Telehealth (INDEPENDENT_AMBULATORY_CARE_PROVIDER_SITE_OTHER): Payer: Managed Care, Other (non HMO)

## 2021-12-08 DIAGNOSIS — Z3480 Encounter for supervision of other normal pregnancy, unspecified trimester: Secondary | ICD-10-CM

## 2021-12-08 DIAGNOSIS — F3160 Bipolar disorder, current episode mixed, unspecified: Secondary | ICD-10-CM

## 2021-12-08 DIAGNOSIS — Z3A00 Weeks of gestation of pregnancy not specified: Secondary | ICD-10-CM

## 2021-12-08 DIAGNOSIS — F332 Major depressive disorder, recurrent severe without psychotic features: Secondary | ICD-10-CM

## 2021-12-08 DIAGNOSIS — F411 Generalized anxiety disorder: Secondary | ICD-10-CM

## 2021-12-08 MED ORDER — GOJJI WEIGHT SCALE MISC: 1.0000 | Status: DC

## 2021-12-08 MED ORDER — BLOOD PRESSURE MONITORING DEVI: 1.0000 | 0 refills | Status: DC

## 2021-12-08 NOTE — Progress Notes (Signed)
Pt states was taking Iron but she stopped due to nausea.

## 2021-12-08 NOTE — Progress Notes (Signed)
New OB Intake  I connected with  Kathy Bird on 12/08/21 at 11:15 AM EST by MyChart Video Visit and verified that I am speaking with the correct person using two identifiers. Nurse is located at [facility] and pt is located at home.  I discussed the limitations, risks, security and privacy concerns of performing an evaluation and management service by telephone and the availability of in person appointments. I also discussed with the patient that there may be a patient responsible charge related to this service. The patient expressed understanding and agreed to proceed.  I explained I am completing New OB Intake today. We discussed her EDD of 04/14/22 that is based on U/S on 11/22/21. Pt is G4/P1. I reviewed her allergies, medications, Medical/Surgical/OB history, and appropriate screenings. I informed her of [facility] services. Based on history, this is a/an  pregnancy uncomplicated .   Patient Active Problem List   Diagnosis Date Noted   Iron deficiency anemia 06/08/2021   Miscarriage 06/07/2021   Attention deficit hyperactivity disorder (ADHD), predominantly inattentive type 08/25/2020   Generalized anxiety disorder 05/12/2020   Bipolar I disorder, most recent episode mixed ([place]) 04/28/2020   MDD (major depressive disorder), recurrent severe, without psychosis ([place]) 04/21/2019    Concerns addressed today  Delivery Plans:  Plans to deliver at [facility].   MyChart/Babyscripts MyChart access verified. I explained pt will have some visits in office and some virtually. Babyscripts instructions given and order placed. Patient verifies receipt of registration text/e-mail. Account successfully created and app downloaded.  Blood Pressure Cuff  Blood pressure cuff ordered for patient to pick-up from [employer]. Explained after first prenatal appt pt will check weekly and document in 7.  Weight scale: Patient does / does not  have weight scale. Weight scale ordered for patient to pick up  from [employer].   Anatomy US Explained first scheduled [place] will be around 19 weeks. Anatomy US scheduled for 12/24/21 at 1:30p. Pt notified to arrive at 01:00p. Scheduled AFP lab only appointment if CenteringPregnancy pt for same day as anatomy US.   Labs Discussed Johnsie Cancel genetic screening with patient. Would like both Panorama and Horizon drawn at new OB visit.Also if interested in genetic testing, tell patient she will need AFP 15-21 weeks to complete genetic testing .Routine prenatal labs needed.  Covid Vaccine Patient has covid vaccine.   CenteringPregnancy Candidate?  If yes, offer as possibility  Mother/ Baby Dyad Candidate?    If yes, offer as possibility  Informed patient of [facility] website  and placed link in her AVS.   Social Determinants of Health Food Insecurity: Patient denies food insecurity. WIC Referral: Patient is interested in referral to [facility].  Transportation: Patient denies transportation needs. Childcare: Discussed no children allowed at ultrasound appointments. Offered childcare services; patient declines childcare services at this time.  Send link to Pregnancy Navigators   Placed OB Box on problem list and updated  First visit review I reviewed new OB appt with pt. I explained she will have a pelvic exam, ob bloodwork with genetic screening, and PAP smear. Explained pt will be seen by Maye Hides, CNM at first visit; encounter routed to appropriate provider. Explained that patient will be seen by pregnancy navigator following visit with provider. [facility] information placed in AVS.   Bethanne Ginger, CMA 12/08/2021  11:19 AM

## 2021-12-08 NOTE — Patient Instructions (Signed)
  At our [facility], we work as an integrated team, providing care to address both physical and emotional health. Your medical provider may refer you to see our Behavioral Health Clinician (BHC) on the same day you see your medical provider, as availability permits; often scheduled virtually at your convenience.  Our BHC is available to all patients, visits generally last between 20-30 minutes, but can be longer or shorter, depending on patient need. The BHC offers help with stress management, coping with symptoms of depression and anxiety, major life changes , sleep issues, changing risky behavior, grief and loss, life stress, working on personal life goals, and  behavioral health issues, as these all affect your overall health and wellness.  The BHC is NOT available for the following: FMLA paperwork, court-ordered evaluations, specialty assessments (custody or disability), letters to employers, or obtaining certification for an emotional support animal. The BHC does not provide long-term therapy. You have the right to refuse integrated behavioral health services, or to reschedule to see the BHC at a later date.  Confidentiality exception: If it is suspected that a child or disabled adult is being abused or neglected, we are required by law to report that to either Child Protective Services or Adult Protective Services.  If you have a diagnosis of Bipolar affective disorder, Schizophrenia, or recurrent Major depressive disorder, we will recommend that you establish care with a psychiatrist, as these are lifelong, chronic conditions, and we want your overall emotional health and medications to be more closely monitored. If you anticipate needing extended maternity leave due to mental health issues postpartum, it it recommended you inform your medical provider, so we can put in a referral to a psychiatrist as soon as possible. The BHC is unable to recommend an extended maternity leave for mental  health issues. Your medical provider or BHC may refer you to a therapist for ongoing, traditional therapy, or to a psychiatrist, for medication management, if it would benefit your overall health. Depending on your insurance, you may have a copay or be charged a deductible, depending on your insurance, to see the BHC. If you are uninsured, it is recommended that you apply for financial assistance. (Forms may be requested at the front desk for in-person visits, via MyChart, or request a form during a virtual visit).  If you see the BHC more than 6 times, you will have to complete a comprehensive clinical assessment interview with the BHC to resume integrated services.  For virtual visits with the BHC, you must be physically in the state of [place] at the time of the visit. For example, if you live in [place], you will have to do an in-person visit with the BHC, and your out-of-state insurance may not cover behavioral health services in [place]. If you are going out of the state or country for any reason, the BHC may see you virtually when you return to [place], but not while you are physically outside of [place].    

## 2021-12-08 NOTE — Addendum Note (Signed)
Addended by: Henrietta Dine on: 12/08/2021 04:57 PM   Modules accepted: Orders

## 2021-12-09 NOTE — BH Specialist Note (Signed)
Integrated Behavioral Health via Telemedicine Visit  12/23/2021 Kathy Bird [id number]  Number of Integrated Behavioral Health Clinician visits: 1- Initial Visit  Session Start time: 1524   Session End time: 1605  Total time in minutes: 41   Referring Provider: Luna Kitchens, CNM Patient/Family location: [facility] Provider location: [facility]  All persons participating in visit: [name] and [facility] Kathy Bird   Types of Service: Individual psychotherapy and Video visit  I connected with Kathy Bird and/or Kathy Bird's  n/a  via  Telephone or Video Enabled Telemedicine Application  (Video is Caregility application) and verified that I am speaking with the correct person using two identifiers. Discussed confidentiality: Yes   I discussed the limitations of telemedicine and the availability of in person appointments.  Discussed there is a possibility of technology failure and discussed alternative modes of communication if that failure occurs.  I discussed that engaging in this telemedicine visit, they consent to the provision of behavioral healthcare and the services will be billed under their insurance.  Patient and/or legal guardian expressed understanding and consented to Telemedicine visit: Yes   Presenting Concerns: Patient and/or family reports the following symptoms/concerns: Coping with anxiety and depression in pregnancy; goal is to manage without BH medication until postpartum time; open to implementing self-coping strategies Duration of problem: Current pregnancy; Severity of problem: moderate  Patient and/or Family's Strengths/Protective Factors: Social connections, Concrete supports in place (healthy food, safe environments, etc.), Sense of purpose, and Physical Health (exercise, healthy diet, medication compliance, etc.)  Goals Addressed: Patient will:  Reduce symptoms of: anxiety, depression,  and stress   Increase knowledge and/or ability of: coping skills and healthy habits   Demonstrate ability to: Increase healthy adjustment to current life circumstances  Progress towards Goals: Ongoing  Interventions: Interventions utilized:  Mindfulness or Management consultant, CBT Cognitive Behavioral Therapy, and Psychoeducation and/or Health Education Standardized Assessments completed:  PHQ9/GAD7 given in past two weeks  Patient and/or Family Response: Pt agrees with treatment plan  Assessment: Patient currently experiencing Bipolar 1 disorder, ADHD (both as previously diagnosed) and psychosocial stress  Patient may benefit from psychoeducation and brief therapeutic interventions regarding coping with symptoms of depression, anxiety and life stress .  Plan: Follow up with behavioral health clinician on : Two weeks; Call Joeline Freer at [phone], as needed. Behavioral recommendations:  -Continue taking prenatal vitamin daily as prescribed -Continue using ginger tea to help manage nausea -Continue using audiobooks and calming sounds daily -CALM relaxation breathing exercise twice daily (morning; at bedtime with sleep sounds); as needed throughout the day -Begin Worry Time strategy today (set start and end times on phone); practice daily for two weeks -Consider reading through additional resources on After Visit Summary Referral(s): Integrated [employer] (In Clinic)  I discussed the assessment and treatment plan with the patient and/or parent/guardian. They were provided an opportunity to ask questions and all were answered. They agreed with the plan and demonstrated an understanding of the instructions.   They were advised to call back or seek an in-person evaluation if the symptoms worsen or if the condition fails to improve as anticipated.  Rae Lips, LCSW  Depression screen [facility] 2/9 12/15/2021 06/16/2021 03/16/2021 12/22/2020 08/25/2020  Decreased Interest 2 1  0 2 1  Down, Depressed, Hopeless 1 1 0 3 0  PHQ - 2 Score 3 2 0 5 1  Altered sleeping 1 0 0 3 3  Tired,  decreased energy 1 3 0 0 3  Change in appetite 0 1 0 3 3  Feeling bad or failure about yourself  2 3 0 3 1  Trouble concentrating 0 3 0 3 3  Moving slowly or fidgety/restless 2 0 0 3 3  Suicidal thoughts 0 0 0 0 0  PHQ-9 Score 9 12 0 20 17  Difficult doing work/chores - Somewhat difficult Not difficult at all Very difficult Somewhat difficult   GAD 7 : Generalized Anxiety Score 12/15/2021 06/16/2021 03/16/2021 12/22/2020  Nervous, Anxious, on Edge 1 1 0 3  Control/stop worrying 1 1 0 2  Worry too much - different things 3 1 0 3  Trouble relaxing 3 1 0 3  Restless 1 1 0 3  Easily annoyed or irritable 2 1 0 3  Afraid - awful might happen 3 1 0 3  Total GAD 7 Score 14 7 0 20  Anxiety Difficulty - Somewhat difficult Not difficult at all Very difficult

## 2021-12-15 ENCOUNTER — Other Ambulatory Visit: Payer: Self-pay

## 2021-12-15 ENCOUNTER — Other Ambulatory Visit (HOSPITAL_COMMUNITY)
Admission: RE | Admit: 2021-12-15 | Discharge: 2021-12-15 | Disposition: A | Discharge status: Home / Self Care | Payer: Managed Care, Other (non HMO) | Source: Ambulatory Visit | Attending: Student | Admitting: Student

## 2021-12-15 ENCOUNTER — Ambulatory Visit (INDEPENDENT_AMBULATORY_CARE_PROVIDER_SITE_OTHER): Payer: Managed Care, Other (non HMO) | Admitting: Student

## 2021-12-15 ENCOUNTER — Encounter: Payer: Self-pay | Admitting: Student

## 2021-12-15 VITALS — BP 121/74 | HR 88 | Wt 148.9 lb

## 2021-12-15 DIAGNOSIS — Z3490 Encounter for supervision of normal pregnancy, unspecified, unspecified trimester: Secondary | ICD-10-CM | POA: Insufficient documentation

## 2021-12-15 DIAGNOSIS — Z3480 Encounter for supervision of other normal pregnancy, unspecified trimester: Secondary | ICD-10-CM | POA: Diagnosis not present

## 2021-12-15 DIAGNOSIS — Z23 Encounter for immunization: Secondary | ICD-10-CM | POA: Diagnosis not present

## 2021-12-15 DIAGNOSIS — Z3A22 22 weeks gestation of pregnancy: Secondary | ICD-10-CM | POA: Diagnosis not present

## 2021-12-15 DIAGNOSIS — Z8759 Personal history of other complications of pregnancy, childbirth and the puerperium: Secondary | ICD-10-CM | POA: Diagnosis not present

## 2021-12-15 LAB — POCT URINALYSIS DIP (DEVICE)
Bilirubin Urine: NEGATIVE
Glucose, UA: NEGATIVE mg/dL
Ketones, ur: NEGATIVE mg/dL
Leukocytes,Ua: NEGATIVE
Nitrite: NEGATIVE
Protein, ur: NEGATIVE mg/dL
Specific Gravity, Urine: 1.025 (ref 1.005–1.030)
Urobilinogen, UA: 0.2 mg/dL (ref 0.0–1.0)
pH: 7 (ref 5.0–8.0)

## 2021-12-15 MED ORDER — ASPIRIN 81 MG PO CHEW: 81.0000 mg | CHEWABLE_TABLET | Freq: Every day | ORAL | 3 refills | Status: DC

## 2021-12-15 MED ORDER — PRENATAL VITAMIN 27-0.8 MG PO TABS: 1.0000 | ORAL_TABLET | Freq: Every day | ORAL | 11 refills | Status: DC

## 2021-12-15 MED ORDER — FERROUS SULFATE 324 (65 FE) MG PO TBEC: 1.0000 | DELAYED_RELEASE_TABLET | Freq: Every day | ORAL | 2 refills | Status: DC

## 2021-12-15 MED ORDER — PRENATAL VITAMINS 28-0.8 MG PO TABS: 1.0000 | ORAL_TABLET | Freq: Every day | ORAL | 3 refills | Status: DC

## 2021-12-15 MED ORDER — PRENATAL VITAMINS 28-0.8 MG PO TABS: 1.0000 | ORAL_TABLET | Freq: Every day | ORAL | 1 refills | Status: DC

## 2021-12-15 NOTE — Patient Instructions (Addendum)
[facility] [phone]      [facility] [address], [place], [place] [address] [phone] Hours: Sunday Closed Monday 9AM-6PM Tuesday 9AM-6PM Wednesday 9AM-6PM Thursday 9AM-6PM Friday           9AM-6PM Saturday         10 AM-1PM

## 2021-12-15 NOTE — Progress Notes (Signed)
°  Subjective:    Kathy Bird is being seen today for her first obstetrical visit.  This is not a planned pregnancy. She is at 104w6d gestation. Her obstetrical history is significant for pre-eclampsia. She reports that she was on Magnesium Sulfate in her first pregnancy postpartum.She denies history of high blood pressure.   Relationship with FOB:  not in a relationship;  . Patient does not intend to breast feed. Pregnancy history fully reviewed.  Patient reports no complaints. She is tearful and reports significant social stress over aggressive tendencies in her FOB.   Review of Systems:   Review of Systems  Constitutional: Negative.   HENT: Negative.    Respiratory: Negative.    Cardiovascular: Negative.   Gastrointestinal: Negative.   Genitourinary: Negative.   Neurological: Negative.    Objective:     BP 121/74    Pulse 88    Wt 148 lb 14.4 oz (67.5 kg)    LMP 04/04/2021 (Exact Date)    BMI 25.16 kg/m  Physical Exam Constitutional:      Appearance: Normal appearance.  HENT:     Nose: Nose normal.  Musculoskeletal:        General: Normal range of motion.  Skin:    General: Skin is warm.     Capillary Refill: Capillary refill takes less than 2 seconds.  Neurological:     General: No focal deficit present.     Mental Status: She is alert.    Exam    Assessment:    Pregnancy: [id number] Patient Active Problem List   Diagnosis Date Noted   Supervision of other normal pregnancy, antepartum 12/08/2021   Low-lying placenta 11/22/2021   Opioid use disorder, mild, abuse ([place]) 11/17/2021   Iron deficiency anemia 06/08/2021   Miscarriage 06/07/2021   Attention deficit hyperactivity disorder (ADHD), predominantly inattentive type 08/25/2020   Generalized anxiety disorder 05/12/2020   Bipolar I disorder, most recent episode mixed ([place]) 04/28/2020   MDD (major depressive disorder), recurrent severe, without psychosis ([place]) 04/21/2019       Plan:     Initial labs  drawn. Prenatal vitamins. Problem list reviewed and updated. AFP3 discussed: ordered. Role of ultrasound in pregnancy discussed; fetal survey: ordered. Amniocentesis discussed: not indicated. Follow up in 4 weeks. 75% of 30 min visit spent on counseling and coordination of care.  -planning IUD or BTL -she is also unsure of her pregnancy intentions; she wants to know what her options are; have discussed with her the options -followed by [facility] for subutex; support and reassurance given.  -pap not done today due to patient's distresss during visit over being pregnant -started on baby ASA -keep appt with Heme-Onc for anemia work up.  -keep [place] appt Mervyn Skeeters [facility] 12/15/2021

## 2021-12-16 LAB — GC/CHLAMYDIA PROBE AMP (~~LOC~~) NOT AT ARMC
Chlamydia: NEGATIVE
Comment: NEGATIVE
Comment: NORMAL
Neisseria Gonorrhea: NEGATIVE

## 2021-12-17 LAB — CBC/D/PLT+RPR+RH+ABO+RUBIGG...
Basophils Absolute: 0 10*3/uL (ref 0.0–0.2)
Basos: 0 %
EOS (ABSOLUTE): 0.1 10*3/uL (ref 0.0–0.4)
Eos: 1 %
HCV Ab: NONREACTIVE
HIV Screen 4th Generation wRfx: NONREACTIVE
Hematocrit: 27.1 % — ABNORMAL LOW (ref 34.0–46.6)
Hemoglobin: 8.1 g/dL — ABNORMAL LOW (ref 11.1–15.9)
Hepatitis B Surface Ag: NEGATIVE
Immature Grans (Abs): 0 10*3/uL (ref 0.0–0.1)
Immature Granulocytes: 0 %
Lymphocytes Absolute: 1.3 10*3/uL (ref 0.7–3.1)
Lymphs: 22 %
MCH: 22.6 pg — ABNORMAL LOW (ref 26.6–33.0)
MCHC: 29.9 g/dL — ABNORMAL LOW (ref 31.5–35.7)
MCV: 76 fL — ABNORMAL LOW (ref 79–97)
Monocytes Absolute: 0.6 10*3/uL (ref 0.1–0.9)
Monocytes: 9 %
Neutrophils Absolute: 4.1 10*3/uL (ref 1.4–7.0)
Neutrophils: 68 %
Platelets: 302 10*3/uL (ref 150–450)
RBC: 3.58 x10E6/uL — ABNORMAL LOW (ref 3.77–5.28)
RDW: 16.4 % — ABNORMAL HIGH (ref 11.7–15.4)
RPR Ser Ql: NONREACTIVE
Rh Factor: POSITIVE
Rubella Antibodies, IGG: 7.79 index (ref >0.99)
WBC: 6.1 10*3/uL (ref 3.4–10.8)

## 2021-12-17 LAB — AFP, SERUM, OPEN SPINA BIFIDA
AFP MoM: 0.74
AFP Value: 67.9 ng/mL
Gest. Age on Collection Date: 22.6 weeks
Maternal Age At EDD: 33.8 yr
OSBR Risk 1 IN: 10000
Test Results:: NEGATIVE
Weight: 149 [lb_av]

## 2021-12-17 LAB — URINE CULTURE

## 2021-12-17 LAB — AB SCR+ANTIBODY ID: Antibody Screen: POSITIVE — AB

## 2021-12-17 LAB — HCV INTERPRETATION

## 2021-12-17 LAB — HEMOGLOBIN A1C
Est. average glucose Bld gHb Est-mCnc: 100 mg/dL
Hgb A1c MFr Bld: 5.1 % (ref 4.8–5.6)

## 2021-12-20 ENCOUNTER — Inpatient Hospital Stay: Payer: Managed Care, Other (non HMO) | Attending: Internal Medicine | Admitting: Internal Medicine

## 2021-12-20 ENCOUNTER — Inpatient Hospital Stay: Payer: Managed Care, Other (non HMO)

## 2021-12-20 ENCOUNTER — Encounter: Payer: Self-pay | Admitting: Medical Oncology

## 2021-12-20 ENCOUNTER — Other Ambulatory Visit: Payer: Self-pay

## 2021-12-20 ENCOUNTER — Encounter: Payer: Self-pay | Admitting: Internal Medicine

## 2021-12-20 VITALS — BP 119/74 | HR 81 | Temp 96.7°F | Resp 17 | Ht 64.5 in | Wt 144.8 lb

## 2021-12-20 DIAGNOSIS — O99012 Anemia complicating pregnancy, second trimester: Secondary | ICD-10-CM | POA: Diagnosis present

## 2021-12-20 DIAGNOSIS — F17210 Nicotine dependence, cigarettes, uncomplicated: Secondary | ICD-10-CM | POA: Diagnosis not present

## 2021-12-20 DIAGNOSIS — D508 Other iron deficiency anemias: Secondary | ICD-10-CM

## 2021-12-20 DIAGNOSIS — D509 Iron deficiency anemia, unspecified: Secondary | ICD-10-CM

## 2021-12-20 DIAGNOSIS — O99332 Smoking (tobacco) complicating pregnancy, second trimester: Secondary | ICD-10-CM | POA: Diagnosis not present

## 2021-12-20 DIAGNOSIS — Z3480 Encounter for supervision of other normal pregnancy, unspecified trimester: Secondary | ICD-10-CM

## 2021-12-20 DIAGNOSIS — Z79899 Other long term (current) drug therapy: Secondary | ICD-10-CM | POA: Insufficient documentation

## 2021-12-20 DIAGNOSIS — Z7982 Long term (current) use of aspirin: Secondary | ICD-10-CM | POA: Insufficient documentation

## 2021-12-20 LAB — CBC WITH DIFFERENTIAL (CANCER CENTER ONLY)
Abs Immature Granulocytes: 0.02 10*3/uL (ref 0.00–0.07)
Basophils Absolute: 0 10*3/uL (ref 0.0–0.1)
Basophils Relative: 0 %
Eosinophils Absolute: 0.1 10*3/uL (ref 0.0–0.5)
Eosinophils Relative: 1 %
HCT: 27.5 % — ABNORMAL LOW (ref 36.0–46.0)
Hemoglobin: 7.9 g/dL — ABNORMAL LOW (ref 12.0–15.0)
Immature Granulocytes: 0 %
Lymphocytes Relative: 19 %
Lymphs Abs: 1.2 10*3/uL (ref 0.7–4.0)
MCH: 22.3 pg — ABNORMAL LOW (ref 26.0–34.0)
MCHC: 28.7 g/dL — ABNORMAL LOW (ref 30.0–36.0)
MCV: 77.7 fL — ABNORMAL LOW (ref 80.0–100.0)
Monocytes Absolute: 0.6 10*3/uL (ref 0.1–1.0)
Monocytes Relative: 11 %
Neutro Abs: 4.1 10*3/uL (ref 1.7–7.7)
Neutrophils Relative %: 69 %
Platelet Count: 298 10*3/uL (ref 150–400)
RBC: 3.54 MIL/uL — ABNORMAL LOW (ref 3.87–5.11)
RDW: 17 % — ABNORMAL HIGH (ref 11.5–15.5)
WBC Count: 6 10*3/uL (ref 4.0–10.5)
nRBC: 0 % (ref 0.0–0.2)

## 2021-12-20 LAB — IRON AND IRON BINDING CAPACITY (CC-WL,HP ONLY)
Iron: 20 ug/dL — ABNORMAL LOW (ref 28–170)
Saturation Ratios: 4 % — ABNORMAL LOW (ref 10.4–31.8)
TIBC: 501 ug/dL — ABNORMAL HIGH (ref 250–450)
UIBC: 481 ug/dL — ABNORMAL HIGH (ref 148–442)

## 2021-12-20 LAB — CMP (CANCER CENTER ONLY)
ALT: 13 U/L (ref 0–44)
AST: 12 U/L — ABNORMAL LOW (ref 15–41)
Albumin: 3.7 g/dL (ref 3.5–5.0)
Alkaline Phosphatase: 53 U/L (ref 38–126)
Anion gap: 6 (ref 5–15)
BUN: 10 mg/dL (ref 6–20)
CO2: 24 mmol/L (ref 22–32)
Calcium: 8.9 mg/dL (ref 8.9–10.3)
Chloride: 107 mmol/L (ref 98–111)
Creatinine: 0.58 mg/dL (ref 0.44–1.00)
GFR, Estimated: >60 mL/min (ref >60)
Glucose, Bld: 83 mg/dL (ref 70–99)
Potassium: 3.7 mmol/L (ref 3.5–5.1)
Sodium: 137 mmol/L (ref 135–145)
Total Bilirubin: 0.3 mg/dL (ref 0.3–1.2)
Total Protein: 7.4 g/dL (ref 6.5–8.1)

## 2021-12-20 LAB — FOLATE: Folate: 14.3 ng/mL (ref >5.9)

## 2021-12-20 LAB — FERRITIN: Ferritin: 4 ng/mL — ABNORMAL LOW (ref 11–307)

## 2021-12-20 LAB — SAMPLE TO BLOOD BANK

## 2021-12-20 LAB — VITAMIN B12: Vitamin B-12: 327 pg/mL (ref 180–914)

## 2021-12-20 MED ORDER — INTEGRA PLUS PO CAPS: 1.0000 | ORAL_CAPSULE | Freq: Every day | ORAL | 3 refills | Status: DC

## 2021-12-20 MED ORDER — PRENATAL VITAMINS 28-0.8 MG PO TABS: 1.0000 | ORAL_TABLET | Freq: Every day | ORAL | 1 refills | Status: DC

## 2021-12-20 NOTE — Progress Notes (Signed)
[place] [phone]   [phone]  CONSULT NOTE  REFERRING PHYSICIAN: Maye Bird, CNM  REASON FOR CONSULTATION:  36 years old African-American female with persistent anemia  HPI Kathy Bird is a 36 y.o. female who is currently [redacted] weeks pregnant with her second child and past medical history significant for seizure at age 12 as well as hypertension, anxiety and depression.  The patient was seen recently by her CNM for routine evaluation and prenatal follow-up.  She was noticed on her blood work to have significant and persistent anemia.  She was referred to [place] today for evaluation and management.  The patient mentioned that she has anemia for many years.  She did not take any iron supplement at regular basis.  She used to have heavy menstruation before her pregnancy.  She is complaining of feeling tired most of the time in addition to having flashing spots in front of her eyes as well as dizzy spells and shortness of breath with exertion with occasional chest pain.  She also has craving to ice and tissue.  She denied having any current bleeding issues.  She denied having any family history of sickle cell disease or thalassemia.  She is not currently on any oral iron supplements but she had a prescription sent to her pharmacy few days ago and has not started it. Family history significant for mother with uterine cancer.  Father has unknown medical history and she has a twin brother who is healthy. The patient is divorced and has 1 son.  She is currently [redacted] weeks pregnant with her second son.  She has no history of smoking, alcohol or drug abuse.  She works for [employer].  HPI  Past Medical History:  Diagnosis Date   Anxiety    Depression    Pregnancy induced hypertension    Seizures ([place])    as child. last seizure when 64yrs old    Past Surgical History:  Procedure Laterality Date   ADENOIDECTOMY      Family History   Problem Relation Age of Onset   Cancer Mother    Healthy Mother     Social History Social History   Tobacco Use   Smoking status: Every Day    Packs/day: 0.25    Years: 8.00    Pack years: 2.00    Types: Cigarettes   Smokeless tobacco: Never  Vaping Use   Vaping Use: Never used  Substance Use Topics   Alcohol use: Not Currently    Alcohol/week: 3.0 standard drinks    Types: 3 Cans of beer per week    Comment: Social   Drug use: Not Currently    Types: Other-see comments    Comment: No Rx drugs since 11/18/2021    Allergies  Allergen Reactions   Peanut-Containing Drug Products Hives   Shellfish Allergy Hives, Itching and Swelling    Tongue swelling also    Current Outpatient Medications  Medication Sig Dispense Refill   acetaminophen (TYLENOL) 500 MG tablet Take 1,000 mg by mouth every 6 (six) hours as needed.     aspirin 81 MG chewable tablet Chew 1 tablet (81 mg total) by mouth daily. 30 tablet 3   Blood Pressure Monitoring DEVI 1 each by Does not apply route once a week. (Patient not taking: Reported on 12/15/2021) 1 each 0   buprenorphine (SUBUTEX) 2 MG SUBL SL tablet Place 2 mg under the tongue 2 (two) times daily as needed.  ferrous sulfate 324 (65 Fe) MG TBEC Take 1 tablet (325 mg total) by mouth daily. 30 tablet 2   No current facility-administered medications for this visit.    Review of Systems  Constitutional: positive for fatigue Eyes: negative Ears, nose, mouth, throat, and face: negative Respiratory: positive for dyspnea on exertion Cardiovascular: negative Gastrointestinal: negative Genitourinary:negative Integument/breast: negative Hematologic/lymphatic: negative Musculoskeletal:negative Neurological: positive for dizziness Behavioral/Psych: negative Endocrine: negative Allergic/Immunologic: negative  Physical Exam  [id number], healthy, no distress, well nourished, and well developed SKIN: skin color, texture, turgor are normal, no  rashes or significant lesions HEAD: Normocephalic, No masses, lesions, tenderness or abnormalities EYES: normal, PERRLA, Conjunctiva are pink and non-injected EARS: External ears normal, Canals clear OROPHARYNX:no exudate, no erythema, and lips, buccal mucosa, and tongue normal  NECK: supple, no adenopathy, no JVD LYMPH:  no palpable lymphadenopathy, no hepatosplenomegaly BREAST:not examined LUNGS: clear to auscultation , and palpation HEART: regular rate & rhythm, no murmurs, and no gallops ABDOMEN:abdomen soft, non-tender, normal bowel sounds, and no masses or organomegaly BACK: Back symmetric, no curvature., No CVA tenderness EXTREMITIES:no joint deformities, effusion, or inflammation, no edema  NEURO: alert & oriented x 3 with fluent speech, no focal motor/sensory deficits  PERFORMANCE STATUS: ECOG 1  LABORATORY DATA: Lab Results  Component Value Date   WBC 6.0 12/20/2021   HGB 7.9 (L) 12/20/2021   HCT 27.5 (L) 12/20/2021   MCV 77.7 (L) 12/20/2021   PLT 298 12/20/2021      Chemistry      Component Value Date/Time   NA 135 06/07/2021 1819   K 3.2 (L) 06/07/2021 1819   CL 106 06/07/2021 1819   CO2 21 (L) 06/07/2021 1819   BUN 12 06/07/2021 1819   CREATININE 0.59 06/07/2021 1819      Component Value Date/Time   CALCIUM 8.8 (L) 06/07/2021 1819   ALKPHOS 42 06/07/2021 1819   AST 15 06/07/2021 1819   ALT 12 06/07/2021 1819   BILITOT 0.3 06/07/2021 1819       RADIOGRAPHIC STUDIES: [place] MFM OB LIMITED  Result Date: 11/22/2021 ----------------------------------------------------------------------  OBSTETRICS REPORT                       (Signed Final 11/22/2021 02:45 pm) ---------------------------------------------------------------------- Patient Info  ID #:       [id number]                          D.O.B.:  1986/08/11 (35 yrs)  Name:       Kathy Bird                    Visit Date: 11/22/2021 12:33 pm ----------------------------------------------------------------------  Performed By  Attending:        Tama High MD        Referred By:      [facility] MAU/Triage  Performed By:     Valda Favia          [facility]                    [place] ---------------------------------------------------------------------- Orders  #  Description  Code        Ordered By  1  [place] MFM OB LIMITED                     [id number]    [place] ----------------------------------------------------------------------  #  Order #                     Accession #                Episode #  1  [id number]                   [id number]                 [id number] ---------------------------------------------------------------------- Indications  Abdominal pain in pregnancy                    O99.89  [redacted] weeks gestation of pregnancy                Z3A.33  Insufficient Prenatal Care                     O09.30  Encounter for antenatal screening,             Z36.9  unspecified ---------------------------------------------------------------------- Fetal Evaluation  Num Of Fetuses:         1  Fetal Heart Rate(bpm):  145  Cardiac Activity:       Observed  Presentation:           Breech  Placenta:               Posterior, low-lying  Amniotic Fluid  AFI FV:      Within normal limits                              Largest Pocket(cm)                              5.3 ---------------------------------------------------------------------- Biometry  FL:       30.8  mm     G. Age:  19w 4d        < 1  % ---------------------------------------------------------------------- OB History  Gravidity:    4         Term:   1        Prem:   0        SAB:   0  TOP:          2       Ectopic:  0        Living: 1 ---------------------------------------------------------------------- Gestational Age  LMP:           33w 1d        Date:  04/04/21                 EDD:   01/09/22  U/S Today:     19w 4d                                        EDD:   04/14/22  Best:           33w 1d     Det. By:  LMP  (04/04/21)          EDD:  01/09/22 ---------------------------------------------------------------------- Anatomy  Thoracic:              Appears normal         Kidneys:                Appear normal  Stomach:               Appears normal, left   Bladder:                Appears normal                         sided ---------------------------------------------------------------------- Cervix Uterus Adnexa  Cervix  Length:              4  cm.  Closed  Uterus  No abnormality visualized.  Right Ovary  Not visualized.  Left Ovary  Not visualized.  Cul De Sac  No free fluid seen.  Adnexa  No abnormality visualized. ---------------------------------------------------------------------- Impression  Patient with uncertain LMP date is being evaluated at the  MAU for complaints of lower abdominal pain.  No history of  vaginal bleeding.  A limited ultrasound study was performed.  Amniotic fluid is  normal good fetal activity seen.  Placenta appears low-lying.  Femur length measurement corresponds to [redacted] weeks  gestation.  We did not date her pregnancy based on female length. ---------------------------------------------------------------------- Recommendations  Recommend fetal anatomy scan and pregnancy dating in 1 to  2 weeks. ----------------------------------------------------------------------                  Tama High, MD Electronically Signed Final Report   11/22/2021 02:45 pm ----------------------------------------------------------------------   ASSESSMENT: This is a very pleasant 36 years old African-American female who was currently pregnant at [redacted] weeks and has a history of iron deficiency anemia for several years. The patient was not treated with oral iron supplement in the past except for short and she has no issues with oral medication.   PLAN: I had a lengthy discussion with the patient today about her current condition and treatment options. I ordered several studies  today for evaluation of her anemia. Repeat CBC showed hemoglobin of 7.9 and hematocrit 27.5% with MCV of 77.7. The patient voices understanding of current disease status and treatment options and is in agreement with the current care plan. Ferritin level was low at 4.  Serum iron 20 and iron saturation 4%.  Serum folate and vitamin B12 level were normal. The patient is not currently on oral iron supplements and she has no previous complication from being on iron tablets. I recommended for the patient to start taking oral iron tablets at regular basis and I will give her prescription for Integra +1 capsule p.o. daily to start today. I do not see an urgent need to give the patient any intravenous iron infusion at this point especially with her pregnancy and risk for any anaphylactic reaction. I will arrange for the patient to come back for follow-up visit in 2 months for evaluation and repeat blood work. If she has no improvement in her anemia or iron studies with the oral iron tablets, we may consider her for iron infusion at that time. The patient is in agreement with the current plan. She was advised to call immediately if she has any other concerning symptoms in the interval.  All questions were answered. The patient knows to call the clinic with any problems, questions or concerns. We can certainly see the patient much  sooner if necessary.  Thank you so much for allowing me to participate in the care of Kathy Bird. I will continue to follow up the patient with you and assist in her care.  The total time spent in the appointment was 60 minutes.  Disclaimer: This note was dictated with voice recognition software. Similar sounding words can inadvertently be transcribed and may not be corrected upon review.   Eilleen Kempf December 20, 2021, 12:09 PM

## 2021-12-23 ENCOUNTER — Ambulatory Visit (INDEPENDENT_AMBULATORY_CARE_PROVIDER_SITE_OTHER): Payer: Managed Care, Other (non HMO) | Admitting: Clinical

## 2021-12-23 ENCOUNTER — Other Ambulatory Visit: Payer: Self-pay | Admitting: Student

## 2021-12-23 ENCOUNTER — Telehealth: Payer: Self-pay

## 2021-12-23 DIAGNOSIS — F3160 Bipolar disorder, current episode mixed, unspecified: Secondary | ICD-10-CM | POA: Diagnosis not present

## 2021-12-23 DIAGNOSIS — Z658 Other specified problems related to psychosocial circumstances: Secondary | ICD-10-CM

## 2021-12-23 DIAGNOSIS — F900 Attention-deficit hyperactivity disorder, predominantly inattentive type: Secondary | ICD-10-CM

## 2021-12-23 NOTE — Patient Instructions (Addendum)
[facility] [address] [place], [place] [address] [phone] (main office) [phone] (Rikia Sukhu's office)  [email or web address]       BRAINSTORMING  Develop a Plan Goals: Provide a way to start conversation about your new life with a baby Assist parents in recognizing and using resources within their reach Help pave the way before birth for an easier period of transition afterwards.  Make a list of the following information to keep in a central location: Full name of Mom and Partner: _____________________________________________ Baby's full name and Date of Birth: ___________________________________________ Home Address: ___________________________________________________________ ________________________________________________________________________ Home Phone: ____________________________________________________________ Parents' cell numbers: _____________________________________________________ ________________________________________________________________________ Name and contact info for OB: ______________________________________________ Name and contact info for Pediatrician:________________________________________ Contact info for Lactation Consultants: ________________________________________  REST and SLEEP *You each need at least 4-5 hours of uninterrupted sleep every day. Write specific names and contact information.* How are you going to rest in the postpartum period? While partner's home? When partner returns to work? When you both return to work? Where will your baby sleep? Who is available to help during the day? Evening? Night? Who could move in for a period to help support you? What are some ideas to help you get enough  sleep? __________________________________________________________________________________________________________________________________________________________________________________________________________________________________________ NUTRITIOUS FOOD AND DRINK *Plan for meals before your baby is born so you can have healthy food to eat during the immediate postpartum period.* Who will look after breakfast? Lunch? Dinner? List names and contact information. Brainstorm quick, healthy ideas for each meal. What can you do before baby is born to prepare meals for the postpartum period? How can others help you with meals? Which grocery stores provide online shopping and delivery? Which restaurants offer take-out or delivery options? ______________________________________________________________________________________________________________________________________________________________________________________________________________________________________________________________________________________________________________________________________________________________________________________________________  CARE FOR MOM *It's important that mom is cared for and pampered in the postpartum period. Remember, the most important ways new mothers need care are: sleep, nutrition, gentle exercise, and time off.* Who can come take care of mom during this period? Make a list of people with their contact information. List some activities that make you feel cared for, rested, and energized? Who can make sure you have opportunities to do these things? Does mom have a space of her very own within your home that's just for her? Make a "Mama Cave" where she can be comfortable, rest, and renew herself  daily. ______________________________________________________________________________________________________________________________________________________________________________________________________________________________________________________________________________________________________________________________________________________________________________________________________    CARE FOR AND FEEDING BABY *Knowledgeable and encouraging people will offer the best support with regard to feeding your baby.* Educate yourself and choose the best feeding option for your baby. Make a list of people who will guide, support, and be a resource for you as your care for and feed your baby. (Friends that have breastfed or are currently breastfeeding, lactation consultants, breastfeeding support groups, etc.) Consider a postpartum doula. (These websites can give you information: dona.org & padanc.org) Seek out local breastfeeding resources like the breastfeeding support group at [facility] or [employer]. ______________________________________________________________________________________________________________________________________________________________________________________________________________________________________________________________________________________________________________________________________________________________________________________________________  CHORES AND ERRANDS Who can help with a thorough cleaning before baby is born? Make a list of people who will help with housekeeping and chores, like laundry, light cleaning, dishes, bathrooms, etc. Who can run some errands for you? What can you do to make sure you are stocked with basic supplies before baby is born? Who is going to do the  shopping? ______________________________________________________________________________________________________________________________________________________________________________________________________________________________________________________________________________________________________________________________________________________________________________________________________     Family Adjustment *Nurture yourselves.it helps parents be more loving and allows for better bonding with their child.* What sorts of things do   partner enjoy doing together? Which activities help you to connect and strengthen your relationship? Make a list of those things. Make a list of people whom you trust to care for your baby so you can have some time together as a couple. What types of things help partner feel connected to Mom? Make a list. What needs will partner have in order to bond with baby? Other children? Who will care for them when you go into labor and while you are in the hospital? Think about what the needs of your older children might be. Who can help you meet those needs? In what ways are you helping them prepare for bringing baby home? List some specific strategies you have for family adjustment. _______________________________________________________________________________________________________________________________________________________________________________________________________________________________________________________________________________________________________________________________________________  SUPPORT *Someone who can empathize with experiences normalizes your problems and makes them more bearable.* Make a list of other friends, neighbors, and/or co-workers you know with infants (and small children, if applicable) with whom you can connect. Make a list of local or online support groups, mom groups, etc. in which you can be  involved. ______________________________________________________________________________________________________________________________________________________________________________________________________________________________________________________________________________________________________________________________________________________________________________________________________  Childcare Plans Investigate and plan for childcare if mom is returning to work. Talk about mom's concerns about her transition back to work. Talk about partner's concerns regarding this transition.  Mental Health *Your mental health is one of the highest priorities for a pregnant or postpartum mom.* 1 in 5 women experience anxiety and/or depression from the time of conception through the first year after birth. Postpartum Mood Disorders are the #1 complication of pregnancy and childbirth and the suffering experienced by these mothers is not necessary! These illnesses are temporary and respond well to treatment, which often includes self-care, social support, talk therapy, and medication when needed. Women experiencing anxiety and depression often say things like: I'm supposed to be happywhy do I feel so sad?, Why can't I snap out of it?, I'm having thoughts that scare me. There is no need to be embarrassed if you are feeling these symptoms: Overwhelmed, anxious, angry, sad, guilty, irritable, hopeless, exhausted but can't sleep You are NOT alone. You are NOT to blame. With help, you WILL be well. Where can I find help? Medical professionals such as your OB, midwife, gynecologist, family practitioner, primary care provider, pediatrician, or mental health providers; [facility] support groups: Feelings After Birth, Breastfeeding Support Group, Baby and Me Group, and Fit 4 Two exercise classes. You have permission to ask for help. It will confirm your feelings, validate your experiences,  share/learn coping strategies, and gain support and encouragement as you heal. You are important! BRAINSTORM Make a list of local resources, including resources for mom and for partner. Identify support groups. Identify people to call late at night - include names and contact info. Talk with partner about perinatal mood and anxiety disorders. Talk with your OB, midwife, and doula about baby blues and about perinatal mood and anxiety disorders. Talk with your pediatrician about perinatal mood and anxiety disorders.   Support & Sanity Savers   What do you really need?  Basics In preparing for a new baby, many expectant parents spend hours shopping for baby clothes, decorating the nursery, and deciding which car seat to buy. Yet most don't think much about what the reality of parenting a newborn will be like, and what they need to make it through that. So, here is the advice of experienced parents. We know you'll read this, and think they're exaggerating, I don't really need that. Just trust [place] on these, OK? Plan for  Plan for all of this, and if it turns out you don't need it, come back and teach us how you did it!  Must-Haves (Once baby's survival needs are met, make sure you attend to your own survival needs!) Sleep An average newborn sleeps 16-18 hours per day, over 6-7 sleep periods, rarely more than three hours at a time. It is normal and healthy for a newborn to wake throughout the night... but really hard on parents!! Naps. Prioritize sleep above any responsibilities like: cleaning house, visiting friends, running errands, etc.  Sleep whenever baby sleeps. If you can't nap, at least have restful times when baby eats. The more rest you get, the more patient you will be, the more emotionally stable, and better at solving problems.  Food You may not have realized it would be difficult to eat when you have a newborn. Yet, when we talk to countless new parents, they say things like "it may be 2:00 pm  when I realize I haven't had breakfast yet." Or "every time we sit down to dinner, baby needs to eat, and my food gets cold, so I don't bother to eat it." Finger food. Before your baby is born, stock up with one months' worth of food that: 1) you can eat with one hand while holding a baby, 2) doesn't need to be prepped, 3) is good hot or cold, 4) doesn't spoil when left out for a few hours, and 5) you like to eat. Think about: nuts, dried fruit, Clif bars, pretzels, jerky, gogurt, baby carrots, apples, bananas, crackers, cheez-n-crackers, string cheese, hot pockets or frozen burritos to microwave, garden burgers and breakfast pastries to put in the toaster, yogurt drinks, etc. Restaurant Menus. Make lists of your favorite restaurants & menu items. When family/friends want to help, you can give specific information without much thought. They can either bring you the food or send gift cards for just the right meals. Freezer Meals.  Take some time to make a few meals to put in the freezer ahead of time.  Easy to freeze meals can be anything such as soup, lasagna, chicken pie, or spaghetti sauce. Set up a Meal Schedule.  Ask friends and family to sign up to bring you meals during the first few weeks of being home. (It can be passed around at baby showers!) You have no idea how helpful this will be until you are in the throes of parenting.  www.takethemameal.com is a great website to check out. Emotional Support Know who to call when you're stressed out. Parenting a newborn is very challenging work. There are times when it totally overwhelms your normal coping abilities. EVERY NEW PARENT NEEDS TO HAVE A PLAN FOR WHO TO CALL WHEN THEY JUST CAN'T COPE ANY MORE. (And it has to be someone other than the baby's other parent!) Before your baby is born, come up with at least one person you can call for support - write their phone number down and post it on the refrigerator. Anxiety & Sadness. Baby blues are normal after  pregnancy; however, there are more severe types of anxiety & sadness which can occur and should not be ignored.  They are always treatable, but you have to take the first step by reaching out for help. [facility] offers a "Mom Talk" group which meets every Tuesday from 10 am - 11 am.  This group is for new moms who need support and connection after their babies are born.  Call [phone].  Really, Really   for them! Make sure these happen often!!) Physical Support with Taking Care of Yourselves Asking friends and family. Before your baby is born, set up a schedule of people who can come and visit and help out (or ask a friend to schedule for you). Any time someone says let me know what I can do to help, sign them up for a day. When they get there, their job is not to take care of the baby (that's your job and your joy). Their job is to take care of you!  Postpartum doulas. If you don't have anyone you can call on for support, look into postpartum doulas:  professionals at helping parents with caring for baby, caring for themselves, getting breastfeeding started, and helping with household tasks. www.padanc.org is a helpful website for learning about doulas in our area. Peer Support / Parent Groups Why: One of the greatest ideas for new parents is to be around other new parents. Parent groups give you a chance to share and listen to others who are going through the same season of life, get a sense of what is normal infant development by watching several babies learn and grow, share your stories of triumph and struggles with empathetic ears, and forgive your own mistakes when you realize all parents are learning by trial and error. Where to find: There are many places you can meet other new parents throughout our community.  [facility] offers the following classes for new moms and their little ones:  Baby and Me (Birth to [place]) and Breastfeeding Support Group. Go to  www.[facility].com or call [phone] for more information. Time for your Relationship It's easy to get so caught up in meeting baby's immediate needs that it's hard to find time to connect with your partner, and meet the needs of your relationship. It's also easy to forget what quality time with your partner actually looks like. If you take your baby on a date, you'd be amazed how much of your couple time is spent feeding the baby, diapering the baby, admiring the baby, and talking about the baby. Dating: Try to take time for just the two of you. Babysitter tip: Sometimes when moms are breastfeeding a newborn, they find it hard to figure out how to schedule outings around baby's unpredictable feeding schedules. Have the babysitter come for a three hour period. When she comes over, if baby has just eaten, you can leave right away, and come back in two hours. If baby hasn't fed recently, you start the date at home. Once baby gets hungry and gets a good feeding in, you can head out for the rest of your date time. Date Nights at Home: If you can't get out, at least set aside one evening a week to prioritize your relationship: whenever baby dozes off or doesn't have any immediate needs, spend a little time focusing on each other. Potential conflicts: The main relationship conflicts that come up for new parents are: issues related to sexuality, financial stresses, a feeling of an unfair division of household tasks, and conflicts in parenting styles. The more you can work on these issues before baby arrives, the better!  Fun and Frills (Don't forget these and don't feel guilty for indulging in them!) Everyone has something in life that is a fun little treat that they do just for themselves. It may be: reading the morning paper, or going for a daily jog, or having coffee with a friend once a week, or going to a movie on  movie on Friday nights, or fine chocolates, or bubble baths, or curling up with a good  book. Unless you do fun things for yourself every now and then, it's hard to have the energy for fun with your baby. Whatever your "special" treats are, make sure you find a way to continue to indulge in them after your baby is born. These special moments can recharge you, and allow you to return to baby with a new joy   PERINATAL MOOD DISORDERS: MATERNAL MENTAL HEALTH FROM CONCEPTION THROUGH THE POSTPARTUM PERIOD   _________________________________________Emergency and Crisis Resources If you are an imminent risk to self or others, are experiencing intense personal distress, and/or have noticed significant changes in activities of daily living, call:  911 [facility]: [phone]  [address], [place], [place], [address] Mobile Crisis: 877-626-1772 National Suicide Hotline: 988 Or visit the following crisis centers: Local Emergency Departments [facility]: [address], [place]  [phone]. Hours: 8:30AM-5PM. Insurance Accepted: Medicaid, Medicare, and Uninsured.  [facility]:  [address], [place]  Mon-Friday 8am-3pm, [phone]                                                                                  ___________ Non-Crisis Resources To identify specific providers that are covered by your insurance, contact your insurance company or local agencies:  [facility]: 1-800-256-2452 [facility]: 888-581-9988 [employer]: 1-800-939-5911 Postpartum Support International- [place]-line: 1-800-944-4773                                                      __Outpatient Therapy and Medication Management   Providers:  [facility]: [phone] Hours: 9AM-5PM  Insurance Accepted: AARP, Aetna, BCBS, Cigna, [employer], Humana, Medicare  [facility] [phone] Hours: 8AM-5:30PM  nsurance Accepted: All insurances EXCEPT AARP, Aetna,  [employer], and Humana [facility]: [phone] Hours: 8AM-8PM Insurance Accepted: Aetna, BCBS, Cigna, [employer], Medicaid, Medicare, Uninsured [facility]: [phone] [facility]: [phone] Hours: 8:30AM-7PM Insurance Accepted: Aetna, BCBS, Medicaid, Medicare, Tricare, United Healthcare [facility]:  [phone]   Hours:9AM-5PM Insurance Accepted:  Aetna, BCBS, [facility], Medicaid, United Health Care  [facility]: [phone] Hours: 9AM-5:30PM Insurance Accepted: AARP, Aetna, BCBS, Cigna, and Medicaid, Medicare, United Health Care [facility]:  [phone] Hours: 9am-5pm Insurance Accepted: BCBS; they do not accept Medicaid/Medicare [facility]: [phone] Hours: 9am-9pm Insurance Accepted: All major insurance including Medicaid and Medicare [facility]: [phone] Hours: 9AM- 5PM Insurance Accepted: All insurances EXCEPT Medicaid and Medicare. [facility]: [phone]   ____________                                                                       Parenting [place]: [phone] [place]:  [place]: (support for children in the NICU and/or with special needs), [phone]   ___________                                                                 Mental Health Support Groups Mental Health Association: [phone]    _____________                                                                                  Online Resources Postpartum Support International: http://jones-berg.com/  800-944-4PPD 2Moms Supporting Moms:  www.momssupportingmoms.net   /Emotional [employer] and Websites Here are a few free apps meant to help you to help yourself.  To find, try searching on the internet to see if the app is offered on Apple/Android devices. If  your first choice doesn't come up on your device, the good news is that there are many choices! Play around with different apps to see which ones are helpful to you.    Calm This is an app meant to help increase calm feelings. Includes info, strategies, and tools for tracking your feelings.      Calm Harm  This app is meant to help with self-harm. Provides many 5-minute or 15-min coping strategies for doing instead of hurting yourself.       Century is a problem-solving tool to help deal with emotions and cope with stress you encounter wherever you are.      MindShift This app can help people cope with anxiety. Rather than trying to avoid anxiety, you can make an important shift and face it.      MY3  MY3 features a support system, safety plan and resources with the goal of offering a tool to use in a time of need.       My Life My Voice  This mood journal offers a simple solution for tracking your thoughts, feelings and moods. Animated emoticons can help identify your mood.       Relax Melodies Designed to help with sleep, on this app you can mix sounds and meditations for relaxation.      Smiling Mind Smiling Mind is meditation made easy: it's a simple tool that helps put a smile on your mind.        Stop, Breathe & Think  A friendly, simple guide for people through meditations for mindfulness and compassion.  Stop, Breathe and Think Kids Enter your current feelings and choose a mission to help you cope. Offers videos for certain moods instead of just sound recordings.       Team Orange The goal of this tool is to help teens change how they think, act, and react. This app helps you focus on your own good feelings and experiences.      The [place] Box The [place] Box (VHB) contains simple tools to help with  coping, relaxation, distraction, and positive thinking.

## 2021-12-23 NOTE — Telephone Encounter (Addendum)
-----   Message from Marylene Land, CNM sent at 12/23/2021 12:34 PM EST ----- Hi! This patient has anemia; I've ordered two doses of venofer for her. Can you call her and set up those two appts? I will send her a MyChart message.   Left message for Short Stay to call us with an appt.   Leonette Nutting  12/23/21

## 2021-12-24 ENCOUNTER — Other Ambulatory Visit: Payer: Self-pay

## 2021-12-24 ENCOUNTER — Ambulatory Visit: Payer: Managed Care, Other (non HMO) | Admitting: *Deleted

## 2021-12-24 ENCOUNTER — Ambulatory Visit: Payer: Managed Care, Other (non HMO) | Attending: Student

## 2021-12-24 VITALS — BP 111/64 | HR 85

## 2021-12-24 DIAGNOSIS — O09292 Supervision of pregnancy with other poor reproductive or obstetric history, second trimester: Secondary | ICD-10-CM | POA: Insufficient documentation

## 2021-12-24 DIAGNOSIS — Z3480 Encounter for supervision of other normal pregnancy, unspecified trimester: Secondary | ICD-10-CM | POA: Diagnosis not present

## 2021-12-24 DIAGNOSIS — Z3A24 24 weeks gestation of pregnancy: Secondary | ICD-10-CM | POA: Insufficient documentation

## 2021-12-24 DIAGNOSIS — O99322 Drug use complicating pregnancy, second trimester: Secondary | ICD-10-CM | POA: Diagnosis not present

## 2021-12-24 DIAGNOSIS — O039 Complete or unspecified spontaneous abortion without complication: Secondary | ICD-10-CM

## 2021-12-24 DIAGNOSIS — Z363 Encounter for antenatal screening for malformations: Secondary | ICD-10-CM | POA: Insufficient documentation

## 2021-12-24 NOTE — BH Specialist Note (Deleted)
Integrated Behavioral Health via Telemedicine Visit ? ?12/24/2021 ?[name] ?[id number] ? ?Number of Integrated Behavioral Health Clinician visits: 1- Initial Visit ? ?Session Start time: 1524 ?  ?Session End time: 1605 ? ?Total time in minutes: 41 ? ? ?Referring Provider: *** ?Patient/Family location: *** ?[facility] Provider location: *** ?All persons participating in visit: *** ?Types of Service: {CHL AMB TYPE OF SERVICE:[phone]} ? ?I connected with Kathy Bird and/or Kathy Bird's {family members:20773} via  Telephone or Video Enabled Telemedicine Application  (Video is Caregility application) and verified that I am speaking with the correct person using two identifiers. Discussed confidentiality: {YES/NO:21197} ? ?I discussed the limitations of telemedicine and the availability of in person appointments.  Discussed there is a possibility of technology failure and discussed alternative modes of communication if that failure occurs. ? ?I discussed that engaging in this telemedicine visit, they consent to the provision of behavioral healthcare and the services will be billed under their insurance. ? ?Patient and/or legal guardian expressed understanding and consented to Telemedicine visit: {YES/NO:21197} ? ?Presenting Concerns: ?Patient and/or family reports the following symptoms/concerns: *** ?Duration of problem: ***; Severity of problem: {Mild/Moderate/Severe:20260} ? ?Patient and/or Family's Strengths/Protective Factors: ?{CHL AMB BH PROTECTIVE [phone]} ? ?Goals Addressed: ?Patient will: ? Reduce symptoms of: {IBH Symptoms:21014056}  ? Increase knowledge and/or ability of: {IBH Patient Tools:21014057}  ? Demonstrate ability to: {IBH Goals:21014053} ? ?Progress towards Goals: ?{CHL AMB BH PROGRESS TOWARDS IHKVQ:[id number]} ? ?Interventions: ?Interventions utilized:  {IBH Interventions:21014054} ?Standardized Assessments completed: {IBH Screening Tools:21014051} ? ?Patient and/or Family Response:  *** ? ?Assessment: ?Patient currently experiencing ***.  ? ?Patient may benefit from ***. ? ?Plan: ?Follow up with behavioral health clinician on : *** ?Behavioral recommendations: *** ?Referral(s): {IBH Referrals:21014055} ? ?I discussed the assessment and treatment plan with the patient and/or parent/guardian. They were provided an opportunity to ask questions and all were answered. They agreed with the plan and demonstrated an understanding of the instructions. ?  ?They were advised to call back or seek an in-person evaluation if the symptoms worsen or if the condition fails to improve as anticipated. ? ?Rae Lips, LCSW ?

## 2021-12-27 ENCOUNTER — Other Ambulatory Visit: Payer: Self-pay | Admitting: *Deleted

## 2021-12-27 DIAGNOSIS — F1910 Other psychoactive substance abuse, uncomplicated: Secondary | ICD-10-CM

## 2021-12-27 DIAGNOSIS — O0932 Supervision of pregnancy with insufficient antenatal care, second trimester: Secondary | ICD-10-CM

## 2021-12-27 DIAGNOSIS — O09299 Supervision of pregnancy with other poor reproductive or obstetric history, unspecified trimester: Secondary | ICD-10-CM

## 2021-12-30 NOTE — Telephone Encounter (Signed)
Short Stay appt scheduled for 01/12/22 @ 0800.  Pt notified of the appt and that she will be there for at least 5 hours.  Pt advised that she will get two IV iron infusions.  Pt verbalized understanding with no further questions.  ? ?Kennidee Heyne,RN  ?12/30/21 ?

## 2021-12-31 ENCOUNTER — Telehealth: Payer: Self-pay | Admitting: Internal Medicine

## 2021-12-31 NOTE — Telephone Encounter (Signed)
Scheduled per 02/20 los, patient has been called and notified.  ?

## 2022-01-05 ENCOUNTER — Encounter: Payer: Self-pay | Admitting: Family Medicine

## 2022-01-05 ENCOUNTER — Ambulatory Visit (INDEPENDENT_AMBULATORY_CARE_PROVIDER_SITE_OTHER): Payer: Managed Care, Other (non HMO) | Admitting: Family Medicine

## 2022-01-05 ENCOUNTER — Other Ambulatory Visit: Payer: Self-pay

## 2022-01-05 VITALS — BP 109/72 | HR 82 | Wt 148.0 lb

## 2022-01-05 DIAGNOSIS — O4440 Low lying placenta NOS or without hemorrhage, unspecified trimester: Secondary | ICD-10-CM

## 2022-01-05 DIAGNOSIS — Z3480 Encounter for supervision of other normal pregnancy, unspecified trimester: Secondary | ICD-10-CM

## 2022-01-05 DIAGNOSIS — D509 Iron deficiency anemia, unspecified: Secondary | ICD-10-CM

## 2022-01-05 DIAGNOSIS — F3160 Bipolar disorder, current episode mixed, unspecified: Secondary | ICD-10-CM

## 2022-01-05 DIAGNOSIS — Z8759 Personal history of other complications of pregnancy, childbirth and the puerperium: Secondary | ICD-10-CM

## 2022-01-05 DIAGNOSIS — F1110 Opioid abuse, uncomplicated: Secondary | ICD-10-CM

## 2022-01-05 MED ORDER — NALOXONE HCL 4 MG/0.1ML NA LIQD: NASAL | 1 refills | Status: DC

## 2022-01-05 NOTE — Patient Instructions (Signed)

## 2022-01-05 NOTE — Progress Notes (Signed)
? ?  Subjective:  ?Kathy Bird is a 36 y.o. G4P1021 at [redacted]w[redacted]d being seen today for ongoing prenatal care.  She is currently monitored for the following issues for this high-risk pregnancy and has MDD (major depressive disorder), recurrent severe, without psychosis ([place]); Bipolar I disorder, most recent episode mixed ([place]); Generalized anxiety disorder; Attention deficit hyperactivity disorder (ADHD), predominantly inattentive type; Miscarriage; Iron deficiency anemia; Supervision of other normal pregnancy, antepartum; Opioid use disorder, mild, abuse ([place]); Low-lying placenta; and History of pre-eclampsia on their problem list. ? ?Patient reports no complaints.  Contractions: Not present. Vag. Bleeding: None.  Movement: Present. Denies leaking of fluid.  ? ?The following portions of the patient's history were reviewed and updated as appropriate: allergies, current medications, past family history, past medical history, past social history, past surgical history and problem list. Problem list updated. ? ?Objective:  ? ?Vitals:  ? 01/05/22 1421  ?BP: 109/72  ?Pulse: 82  ?Weight: 148 lb (67.1 kg)  ? ? ?Fetal Status: Fetal Heart Rate (bpm): 156   Movement: Present    ? ?General:  Alert, oriented and cooperative. Patient is in no acute distress.  ?Skin: Skin is warm and dry. No rash noted.   ?Cardiovascular: Normal heart rate noted  ?Respiratory: Normal respiratory effort, no problems with respiration noted  ?Abdomen: Soft, gravid, appropriate for gestational age. Pain/Pressure: Present     ?Pelvic: Vag. Bleeding: None     ?Cervical exam deferred        ?Extremities: Normal range of motion.     ?Mental Status: Normal mood and affect. Normal behavior. Normal judgment and thought content.  ? ?Urinalysis:     ? ?Assessment and Plan:  ?Pregnancy: [id number] at [redacted]w[redacted]d ? ?1. Supervision of other normal pregnancy, antepartum ?BP and FHR normal ?Discussed fasting labs for next visit ? ?2. Opioid use disorder, mild, abuse  ([place]) ?Follows with [facility] ?1 mg BID currently  ?Only ever using illicit pills, no injection or intranasal use, does not need further hepatitis screening ?Will send Narcan for harm reduction ? ?3. Bipolar I disorder, most recent episode mixed ([place]) ?Feels like mood is up and down ?Follows w [place] provider at [facility] ? ?4. Iron deficiency anemia, unspecified iron deficiency anemia type ?Seen by [facility], deferred IV iron for PO, has follow up and may be scheduled for IV iron at that time ?Lab Results  ?Component Value Date  ? HGB 7.9 (L) 12/20/2021  ? ? ?5. Low-lying placenta ?Resolved on scan from 12/24/2021 ? ?6. History of pre-eclampsia ?On ASA ?normotensive ? ?Preterm labor symptoms and general obstetric precautions including but not limited to vaginal bleeding, contractions, leaking of fluid and fetal movement were reviewed in detail with the patient. ?Please refer to After Visit Summary for other counseling recommendations.  ?Return in 2 weeks (on 01/19/2022) for [facility], ob visit, needs MD. ? ? ?Clarnce Flock, MD ? ?

## 2022-01-11 NOTE — BH Specialist Note (Signed)
Integrated Behavioral Health via Telemedicine Visit ? ?01/11/2022 ?Kathy Bird ?[id number] ? ?Number of Integrated Behavioral Health Clinician visits: 1- Initial Visit ? ?Session Start time: 1524 ?  ?Session End time: 1605 ? ?Total time in minutes: 41 ? ? ?Referring Provider: Luna Kitchens, CNM ?Patient/Family location: Home ?[facility] Provider location: [facility] [employer] [facility] ? ?All persons participating in visit: [name] and [facility] Asher Muir Trea Latner  ? ?Types of Service: Individual psychotherapy and Video visit ? ?I connected with [name] and/or Kaysen L Mcclimans's  n/a  via  Telephone or Video Enabled Telemedicine Application  (Video is Caregility application) and verified that I am speaking with the correct person using two identifiers. Discussed confidentiality: Yes  ? ?I discussed the limitations of telemedicine and the availability of in person appointments.  Discussed there is a possibility of technology failure and discussed alternative modes of communication if that failure occurs. ? ?I discussed that engaging in this telemedicine visit, they consent to the provision of behavioral healthcare and the services will be billed under their insurance. ? ?Patient and/or legal guardian expressed understanding and consented to Telemedicine visit: Yes  ? ?Presenting Concerns: ?Patient and/or family reports the following symptoms/concerns: Increased anxiety and worry daily over son's walk home from school and fear of being alone, lack of quality sleep; began drinking small cup of coffee in the mornings again in the last two days, with slight mood and energy improvement.  ?Duration of problem: Current pregnancy increase; Severity of problem: moderate ? ?Patient and/or Family's Strengths/Protective Factors: ?Social connections, Concrete supports in place (healthy food, safe environments, etc.), Sense of purpose, and Physical Health (exercise, healthy diet, medication  compliance, etc.) ? ?Goals Addressed: ?Patient will: ? Reduce symptoms of: anxiety, depression, and stress  ? Increase knowledge and/or ability of: stress reduction  ? Demonstrate ability to: Increase healthy adjustment to current life circumstances and Increase adequate support systems for patient/family ? ?Progress towards Goals: ?Ongoing ? ?Interventions: ?Interventions utilized:  Solution-Focused Strategies and Sleep Hygiene ?Standardized Assessments completed:  not given today ? ?Patient and/or Family Response: Patient agrees with treatment plan. ? ? ?Assessment: ?Patient currently experiencing Bipolar 1 disorder, ADHD (as previously diagnosed); Psychosocial stress ? ?Patient may benefit from continued psychoeducation and brief therapeutic interventions regarding coping with symptoms of anxiety, depression, stress. ? ?Plan: ?Follow up with behavioral health clinician on : One month ?Behavioral recommendations:  ?-Continue taking prenatal vitamin daily; discuss melatonin option with medical provider at 01/21/22 appointment ?-Continue plan to call and reschedule IV infusion appointment; attend all upcoming appointments ?-Continue using self-coping strategies to help manage mood and anxiety in pregnancy(audiobooks, calming sounds, relaxation breathing, worry time) ?-Research security cameras within one week for peace of mind (know when son arrives home daily) ?-Consider drinking morning coffee outdoors, first hour upon waking ?Referral(s): Integrated [employer] (In Clinic) ? ?I discussed the assessment and treatment plan with the patient and/or parent/guardian. They were provided an opportunity to ask questions and all were answered. They agreed with the plan and demonstrated an understanding of the instructions. ?  ?They were advised to call back or seek an in-person evaluation if the symptoms worsen or if the condition fails to improve as anticipated. ? ?Rae Lips, LCSW ? ? ?  12/15/2021  ?   2:40 PM 06/16/2021  ? 11:24 AM 03/16/2021  ?  3:17 PM 12/22/2020  ?  9:01 AM 08/25/2020  ?  3:04 PM  ?Depression screen PHQ  2/9  ?Decreased Interest 2      ?Down, Depressed, Hopeless 1      ?PHQ - 2 Score 3      ?Altered sleeping 1      ?Tired, decreased energy 1      ?Change in appetite 0      ?Feeling bad or failure about yourself  2      ?Trouble concentrating 0      ?Moving slowly or fidgety/restless 2      ?Suicidal thoughts 0      ?PHQ-9 Score 9      ?Difficult doing work/chores       ?  ? Information is confidential and restricted. Go to Review Flowsheets to unlock data.  ? ? ?  12/15/2021  ?  2:40 PM 06/16/2021  ? 11:27 AM 03/16/2021  ?  3:18 PM 12/22/2020  ?  9:04 AM  ?GAD 7 : Generalized Anxiety Score  ?Nervous, Anxious, on Edge 1     ?Control/stop worrying 1     ?Worry too much - different things 3     ?Trouble relaxing 3     ?Restless 1     ?Easily annoyed or irritable 2     ?Afraid - awful might happen 3     ?Total GAD 7 Score 14     ?Anxiety Difficulty      ?  ? Information is confidential and restricted. Go to Review Flowsheets to unlock data.  ? ? ? ?

## 2022-01-12 ENCOUNTER — Other Ambulatory Visit: Payer: Self-pay

## 2022-01-12 ENCOUNTER — Encounter (HOSPITAL_COMMUNITY)
Admission: RE | Admit: 2022-01-12 | Discharge: 2022-01-12 | Disposition: A | Discharge status: Home / Self Care | Payer: Managed Care, Other (non HMO) | Source: Ambulatory Visit | Attending: Family Medicine | Admitting: Family Medicine

## 2022-01-12 DIAGNOSIS — O99019 Anemia complicating pregnancy, unspecified trimester: Secondary | ICD-10-CM | POA: Diagnosis not present

## 2022-01-12 DIAGNOSIS — D509 Iron deficiency anemia, unspecified: Secondary | ICD-10-CM | POA: Insufficient documentation

## 2022-01-12 MED ORDER — SODIUM CHLORIDE 0.9 % IV SOLN
500.0000 mg | INTRAVENOUS | Status: DC
  Administered 2022-01-12: 500 mg via INTRAVENOUS
  Filled 2022-01-12: qty 25

## 2022-01-19 ENCOUNTER — Inpatient Hospital Stay (HOSPITAL_COMMUNITY): Admission: RE | Admit: 2022-01-19 | Payer: Managed Care, Other (non HMO) | Source: Ambulatory Visit

## 2022-01-19 ENCOUNTER — Ambulatory Visit (INDEPENDENT_AMBULATORY_CARE_PROVIDER_SITE_OTHER): Payer: Managed Care, Other (non HMO) | Admitting: Clinical

## 2022-01-19 DIAGNOSIS — Z658 Other specified problems related to psychosocial circumstances: Secondary | ICD-10-CM

## 2022-01-19 DIAGNOSIS — F900 Attention-deficit hyperactivity disorder, predominantly inattentive type: Secondary | ICD-10-CM

## 2022-01-19 DIAGNOSIS — F3160 Bipolar disorder, current episode mixed, unspecified: Secondary | ICD-10-CM

## 2022-01-19 NOTE — Patient Instructions (Signed)
[facility] [address] [place], [place] [address] [phone] (main office) [phone] (Kameron Blethen's office)   

## 2022-01-21 ENCOUNTER — Ambulatory Visit: Payer: Managed Care, Other (non HMO) | Attending: Maternal & Fetal Medicine

## 2022-01-21 ENCOUNTER — Ambulatory Visit: Payer: Managed Care, Other (non HMO) | Admitting: *Deleted

## 2022-01-21 ENCOUNTER — Other Ambulatory Visit: Payer: Self-pay

## 2022-01-21 ENCOUNTER — Encounter: Payer: Self-pay | Admitting: Family Medicine

## 2022-01-21 ENCOUNTER — Other Ambulatory Visit: Payer: Self-pay | Admitting: *Deleted

## 2022-01-21 ENCOUNTER — Other Ambulatory Visit: Payer: Managed Care, Other (non HMO)

## 2022-01-21 ENCOUNTER — Ambulatory Visit (INDEPENDENT_AMBULATORY_CARE_PROVIDER_SITE_OTHER): Payer: Managed Care, Other (non HMO) | Admitting: Certified Nurse Midwife

## 2022-01-21 ENCOUNTER — Other Ambulatory Visit: Payer: Self-pay | Admitting: Student

## 2022-01-21 VITALS — BP 110/60 | HR 83 | Wt 152.3 lb

## 2022-01-21 VITALS — BP 110/68 | HR 98

## 2022-01-21 DIAGNOSIS — O09293 Supervision of pregnancy with other poor reproductive or obstetric history, third trimester: Secondary | ICD-10-CM | POA: Diagnosis not present

## 2022-01-21 DIAGNOSIS — F1910 Other psychoactive substance abuse, uncomplicated: Secondary | ICD-10-CM

## 2022-01-21 DIAGNOSIS — O0993 Supervision of high risk pregnancy, unspecified, third trimester: Secondary | ICD-10-CM

## 2022-01-21 DIAGNOSIS — O99350 Diseases of the nervous system complicating pregnancy, unspecified trimester: Secondary | ICD-10-CM | POA: Diagnosis not present

## 2022-01-21 DIAGNOSIS — Z3A28 28 weeks gestation of pregnancy: Secondary | ICD-10-CM

## 2022-01-21 DIAGNOSIS — O321XX0 Maternal care for breech presentation, not applicable or unspecified: Secondary | ICD-10-CM | POA: Diagnosis not present

## 2022-01-21 DIAGNOSIS — O0933 Supervision of pregnancy with insufficient antenatal care, third trimester: Secondary | ICD-10-CM

## 2022-01-21 DIAGNOSIS — O99353 Diseases of the nervous system complicating pregnancy, third trimester: Secondary | ICD-10-CM | POA: Insufficient documentation

## 2022-01-21 DIAGNOSIS — O99320 Drug use complicating pregnancy, unspecified trimester: Secondary | ICD-10-CM | POA: Diagnosis not present

## 2022-01-21 DIAGNOSIS — Z3480 Encounter for supervision of other normal pregnancy, unspecified trimester: Secondary | ICD-10-CM

## 2022-01-21 DIAGNOSIS — G40909 Epilepsy, unspecified, not intractable, without status epilepticus: Secondary | ICD-10-CM

## 2022-01-21 DIAGNOSIS — O0932 Supervision of pregnancy with insufficient antenatal care, second trimester: Secondary | ICD-10-CM

## 2022-01-21 DIAGNOSIS — O99323 Drug use complicating pregnancy, third trimester: Secondary | ICD-10-CM | POA: Diagnosis not present

## 2022-01-21 DIAGNOSIS — F1120 Opioid dependence, uncomplicated: Secondary | ICD-10-CM

## 2022-01-21 DIAGNOSIS — O09299 Supervision of pregnancy with other poor reproductive or obstetric history, unspecified trimester: Secondary | ICD-10-CM

## 2022-01-21 DIAGNOSIS — F1110 Opioid abuse, uncomplicated: Secondary | ICD-10-CM

## 2022-01-21 DIAGNOSIS — F5104 Psychophysiologic insomnia: Secondary | ICD-10-CM

## 2022-01-21 DIAGNOSIS — O039 Complete or unspecified spontaneous abortion without complication: Secondary | ICD-10-CM

## 2022-01-21 MED ORDER — MELATONIN 1 MG PO TABS: 1.0000 mg | ORAL_TABLET | Freq: Every day | ORAL | 6 refills | Status: DC

## 2022-01-21 NOTE — Progress Notes (Signed)
? ?PRENATAL VISIT NOTE ? ?Subjective:  ?Kathy Bird is a 36 y.o. G4P1021 at [redacted]w[redacted]d being seen today for ongoing prenatal care.  She is currently monitored for the following issues for this high-risk pregnancy and has MDD (major depressive disorder), recurrent severe, without psychosis ([place]); Bipolar I disorder, most recent episode mixed ([place]); Generalized anxiety disorder; Attention deficit hyperactivity disorder (ADHD), predominantly inattentive type; Miscarriage; Iron deficiency anemia; Supervision of other normal pregnancy, antepartum; Opioid use disorder, mild, abuse ([place]); Low-lying placenta; and History of pre-eclampsia on their problem list. ? ?Patient reports  physically doing well but feeling very anxious, not taking her subutex as ordered. Having trouble with her boss at work and wants to go off light duty so she can transfer to a different department. Having trouble sleeping, wants to know if she can take melatonin for sleep .  Contractions: Not present. Vag. Bleeding: None.  Movement: Present. Denies leaking of fluid.  ? ?The following portions of the patient's history were reviewed and updated as appropriate: allergies, current medications, past family history, past medical history, past social history, past surgical history and problem list.  ? ?Objective:  ? ?Vitals:  ? 01/21/22 0832  ?BP: 110/60  ?Pulse: 83  ?Weight: 152 lb 4.8 oz (69.1 kg)  ? ?Fetal Status: Fetal Heart Rate (bpm): 148 Fundal Height: 28 cm Movement: Present    ? ?General:  Alert, oriented and cooperative. Patient is in no acute distress.  ?Skin: Skin is warm and dry. No rash noted.   ?Cardiovascular: Normal heart rate noted  ?Respiratory: Normal respiratory effort, no problems with respiration noted  ?Abdomen: Soft, gravid, appropriate for gestational age.  Pain/Pressure: Absent     ?Pelvic: Cervical exam deferred        ?Extremities: Normal range of motion.  Edema: Trace  ?Mental Status: Normal mood and affect. Normal behavior.  Normal judgment and thought content.  ? ?Assessment and Plan:  ?Pregnancy: [id number] at [redacted]w[redacted]d ?1. Supervision of high risk pregnancy in third trimester ?- Doing well, feeling regular and vigorous fetal movement  ? ?2. [redacted] weeks gestation of pregnancy ?- Routine OB care including GTT today ? ?3. Psychophysiological insomnia ?- melatonin 1 MG TABS tablet; Take 1 tablet (1 mg total) by mouth at bedtime.  Dispense: 30 tablet; Refill: 6 ? ?4. Opioid use disorder, mild, abuse ([place]) ?- Advised to go back to taking her subutex as prescribed since not doing so can increase her feelings of anxiety. Validated her desire not to take much while pregnant but reassured her that her own mental health should take precedent. Discussed how pregnancy increases the excretion of meds so lowering the dose of a psychoactive medication can make things much worse. Pt expressed understanding. ? ?Preterm labor symptoms and general obstetric precautions including but not limited to vaginal bleeding, contractions, leaking of fluid and fetal movement were reviewed in detail with the patient. ?Please refer to After Visit Summary for other counseling recommendations.  ? ?Return in about 2 weeks (around 02/04/2022) for IN-PERSON, [place]. ? ?Future Appointments  ?Date Time Provider [place]  ?01/21/2022  1:00 PM [facility] NURSE [facility]  ?01/21/2022  1:15 PM [facility] US2 [facility]  ?02/08/2022  8:15 AM Clarnce Flock, MD [facility]  ?02/16/2022  3:45 PM [place] [facility] [place]  ?02/21/2022  9:45 AM [facility] LAB [facility] None  ?02/21/2022 10:15 AM Curt Bears, MD [facility] None  ?02/22/2022  8:35 AM Clarnce Flock, MD [facility]  ?03/08/2022  8:15 AM Dione Plover,  Annice Needy, MD [facility]  ?03/22/2022  8:15 AM Clarnce Flock, MD [facility]  ?03/29/2022  8:35 AM Clarnce Flock, MD [facility]  ?04/05/2022  8:15 AM Clarnce Flock, MD [facility]  ?04/12/2022  8:15 AM Dione Plover, Annice Needy, MD [facility]   ? ? ?Gabriel Carina, CNM ?

## 2022-01-22 LAB — CBC
Hematocrit: 31.7 % — ABNORMAL LOW (ref 34.0–46.6)
Hemoglobin: 9.9 g/dL — ABNORMAL LOW (ref 11.1–15.9)
MCH: 26.7 pg (ref 26.6–33.0)
MCHC: 31.2 g/dL — ABNORMAL LOW (ref 31.5–35.7)
MCV: 85 fL (ref 79–97)
Platelets: 210 10*3/uL (ref 150–450)
RBC: 3.71 x10E6/uL — ABNORMAL LOW (ref 3.77–5.28)
RDW: 24.2 % — ABNORMAL HIGH (ref 11.7–15.4)
WBC: 6.2 10*3/uL (ref 3.4–10.8)

## 2022-01-22 LAB — GLUCOSE TOLERANCE, 2 HOURS W/ 1HR
Glucose, 1 hour: 111 mg/dL (ref 70–179)
Glucose, 2 hour: 89 mg/dL (ref 70–152)
Glucose, Fasting: 85 mg/dL (ref 70–91)

## 2022-01-22 LAB — RPR: RPR Ser Ql: NONREACTIVE

## 2022-01-22 LAB — HIV ANTIBODY (ROUTINE TESTING W REFLEX): HIV Screen 4th Generation wRfx: NONREACTIVE

## 2022-02-02 NOTE — BH Specialist Note (Addendum)
Integrated Behavioral Health via Telemedicine Visit ? ?02/16/2022 ?[name] ?[id number] ? ?Number of Integrated Behavioral Health Clinician visits: 3- Third Visit ? ?Session Start time: 1602 ?  ?Session End time: 1625 ? ?Total time in minutes: 23 ? ? ?Referring Provider: Luna Kitchens, CNM ?Patient/Family location: Home ?[facility] Provider location: [facility] [employer] [facility] ? ?All persons participating in visit: [name] and [facility] [name]  ? ?Types of Service: Individual psychotherapy and Video visit ? ?I connected with Kathy Bird and/or Kathy Bird's  n/a  via  Telephone or Video Enabled Telemedicine Application  (Video is Caregility application) and verified that I am speaking with the correct person using two identifiers. Discussed confidentiality: Yes  ? ?I discussed the limitations of telemedicine and the availability of in person appointments.  Discussed there is a possibility of technology failure and discussed alternative modes of communication if that failure occurs. ? ?I discussed that engaging in this telemedicine visit, they consent to the provision of behavioral healthcare and the services will be billed under their insurance. ? ?Patient and/or legal guardian expressed understanding and consented to Telemedicine visit: Yes  ? ?Presenting Concerns: ?Patient and/or family reports the following symptoms/concerns: Continued anxiety and worry, though decreasing; running out of Suboxone soon; plans to call [facility] for refill. ?Duration of problem: Current pregnancy increase; Severity of problem: moderate ? ?Patient and/or Family's Strengths/Protective Factors: ?Social connections, Concrete supports in place (healthy food, safe environments, etc.), Sense of purpose, and Physical Health (exercise, healthy diet, medication compliance, etc.) ? ?Goals Addressed: ?Patient will: ? Reduce symptoms of: anxiety, depression, and stress  ? Increase knowledge  and/or ability of: stress reduction  ? Demonstrate ability to: Increase healthy adjustment to current life circumstances and Increase motivation to adhere to plan of care ? ?Progress towards Goals: ?Ongoing ? ?Interventions: ?Interventions utilized:  Solution-Focused Strategies ?Standardized Assessments completed: GAD-7 and PHQ 9 ? ?Patient and/or Family Response: Patient agrees with treatment plan. ? ? ?Assessment: ?Patient currently experiencing Bipolar 1 disorder, ADHD (both as previously diagnosed); Psychosocial stress.  ? ?Patient may benefit from psychoeducation and brief therapeutic interventions regarding coping with symptoms of depression, anxiety, stress. ? ?Plan: ?Follow up with behavioral health clinician on : One month ?Behavioral recommendations:  ?--Continue taking prenatal vitamin and melatonin daily; continue BH medication as prescribed by medical providers at [facility] ?-Continue plan to attend IV infusion appointments after scheduling ?-Continue using self-coping strategies to manage emotions ?-Read through information on After Visit Summary; use as needed ?Referral(s): Integrated [employer] (In Clinic) ? ?I discussed the assessment and treatment plan with the patient and/or parent/guardian. They were provided an opportunity to ask questions and all were answered. They agreed with the plan and demonstrated an understanding of the instructions. ?  ?They were advised to call back or seek an in-person evaluation if the symptoms worsen or if the condition fails to improve as anticipated. ? ?Rae Lips, LCSW ? ? ?  02/16/2022  ?  4:08 PM 01/21/2022  ? 11:08 AM 12/15/2021  ?  2:40 PM 06/16/2021  ? 11:24 AM 03/16/2021  ?  3:17 PM  ?Depression screen PHQ 2/9  ?Decreased Interest 0 1 2    ?Down, Depressed, Hopeless 1 1 1     ?PHQ - 2 Score 1 2 3     ?Altered sleeping 3 3 1     ?Tired, decreased energy 3 3 1     ?Change in appetite 0 2 0    ?  Feeling bad or failure about yourself  0 3 2     ?Trouble concentrating 3 3 0    ?Moving slowly or fidgety/restless 0 0 2    ?Suicidal thoughts 0 0 0    ?PHQ-9 Score 10 16 9     ?Difficult doing work/chores       ?  ? Information is confidential and restricted. Go to Review Flowsheets to unlock data.  ? ? ? ?  02/16/2022  ?  4:11 PM 01/21/2022  ? 11:09 AM 12/15/2021  ?  2:40 PM 06/16/2021  ? 11:27 AM  ?GAD 7 : Generalized Anxiety Score  ?Nervous, Anxious, on Edge 3 3 1    ?Control/stop worrying 0 3 1   ?Worry too much - different things 2 3 3    ?Trouble relaxing 3 3 3    ?Restless 1 3 1    ?Easily annoyed or irritable 1 1 2    ?Afraid - awful might happen 1 3 3    ?Total GAD 7 Score 11 19 14    ?Anxiety Difficulty      ?  ? Information is confidential and restricted. Go to Review Flowsheets to unlock data.  ? ? ? ? ?

## 2022-02-08 ENCOUNTER — Encounter: Payer: Self-pay | Admitting: Family Medicine

## 2022-02-08 ENCOUNTER — Ambulatory Visit (INDEPENDENT_AMBULATORY_CARE_PROVIDER_SITE_OTHER): Payer: Managed Care, Other (non HMO) | Admitting: Family Medicine

## 2022-02-08 VITALS — BP 115/75 | HR 80 | Wt 153.8 lb

## 2022-02-08 DIAGNOSIS — F3160 Bipolar disorder, current episode mixed, unspecified: Secondary | ICD-10-CM

## 2022-02-08 DIAGNOSIS — Z8759 Personal history of other complications of pregnancy, childbirth and the puerperium: Secondary | ICD-10-CM

## 2022-02-08 DIAGNOSIS — D509 Iron deficiency anemia, unspecified: Secondary | ICD-10-CM

## 2022-02-08 DIAGNOSIS — Z3480 Encounter for supervision of other normal pregnancy, unspecified trimester: Secondary | ICD-10-CM

## 2022-02-08 DIAGNOSIS — F1110 Opioid abuse, uncomplicated: Secondary | ICD-10-CM

## 2022-02-08 DIAGNOSIS — O219 Vomiting of pregnancy, unspecified: Secondary | ICD-10-CM

## 2022-02-08 MED ORDER — ONDANSETRON 4 MG PO TBDP: 4.0000 mg | ORAL_TABLET | Freq: Four times a day (QID) | ORAL | 2 refills | Status: DC | PRN

## 2022-02-08 NOTE — Patient Instructions (Signed)

## 2022-02-08 NOTE — Progress Notes (Signed)
? ?  Subjective:  ?Kathy Bird is a 36 y.o. G4P1021 at [redacted]w[redacted]d being seen today for ongoing prenatal care.  She is currently monitored for the following issues for this high-risk pregnancy and has MDD (major depressive disorder), recurrent severe, without psychosis ([place]); Bipolar I disorder, most recent episode mixed ([place]); Generalized anxiety disorder; Attention deficit hyperactivity disorder (ADHD), predominantly inattentive type; Miscarriage; Iron deficiency anemia; Supervision of other normal pregnancy, antepartum; Opioid use disorder, mild, abuse ([place]); and History of pre-eclampsia on their problem list. ? ?Patient reports no complaints.  Contractions: Irritability. Vag. Bleeding: None.  Movement: Present. Denies leaking of fluid.  ? ?The following portions of the patient's history were reviewed and updated as appropriate: allergies, current medications, past family history, past medical history, past social history, past surgical history and problem list. Problem list updated. ? ?Objective:  ? ?Vitals:  ? 02/08/22 0823  ?BP: 115/75  ?Pulse: 80  ?Weight: 153 lb 12.8 oz (69.8 kg)  ? ? ?Fetal Status: Fetal Heart Rate (bpm): 147   Movement: Present    ? ?General:  Alert, oriented and cooperative. Patient is in no acute distress.  ?Skin: Skin is warm and dry. No rash noted.   ?Cardiovascular: Normal heart rate noted  ?Respiratory: Normal respiratory effort, no problems with respiration noted  ?Abdomen: Soft, gravid, appropriate for gestational age. Pain/Pressure: Present     ?Pelvic: Vag. Bleeding: None     ?Cervical exam deferred        ?Extremities: Normal range of motion.  Edema: Trace  ?Mental Status: Normal mood and affect. Normal behavior. Normal judgment and thought content.  ? ?Urinalysis:     ? ?Assessment and Plan:  ?Pregnancy: [id number] at [redacted]w[redacted]d ? ?1. Supervision of other normal pregnancy, antepartum ?BP and FHR normal ?Declines TDaP today ?Having more nausea of late, see below ?Work letter provided for  today ? ?2. Opioid use disorder, mild, abuse ([place]) ?At last visit reported reducing dose, previously on 1 mg BID prescribed by [facility] ?Today reports she is on 2 mg TID ?Given more nausea not sure if it is pregnancy related or if she needs to go up on suboxone ?Recommend trialing zofran, if effective then no further dose adjustment needed ?If not working then recommend she talk with providers at [facility] about increasing suboxone dose ?Has Narcan rx ? ?3. History of pre-eclampsia ?On ASA, normotensive ? ?4. Bipolar I disorder, most recent episode mixed ([place]) ?Follows w [place] provider at [place] [address] ? ?5. Iron deficiency anemia, unspecified iron deficiency anemia type ?Last hgb 9.9 ? ?Preterm labor symptoms and general obstetric precautions including but not limited to vaginal bleeding, contractions, leaking of fluid and fetal movement were reviewed in detail with the patient. ?Please refer to After Visit Summary for other counseling recommendations.  ?Return in 2 weeks (on 02/22/2022) for [facility], ob visit. ? ? ?Clarnce Flock, MD ? ?

## 2022-02-16 ENCOUNTER — Ambulatory Visit (INDEPENDENT_AMBULATORY_CARE_PROVIDER_SITE_OTHER): Payer: Managed Care, Other (non HMO) | Admitting: Clinical

## 2022-02-16 ENCOUNTER — Encounter: Payer: Self-pay | Admitting: Family Medicine

## 2022-02-16 DIAGNOSIS — F900 Attention-deficit hyperactivity disorder, predominantly inattentive type: Secondary | ICD-10-CM

## 2022-02-16 DIAGNOSIS — Z658 Other specified problems related to psychosocial circumstances: Secondary | ICD-10-CM

## 2022-02-16 DIAGNOSIS — F3160 Bipolar disorder, current episode mixed, unspecified: Secondary | ICD-10-CM

## 2022-02-16 NOTE — Patient Instructions (Signed)
[facility] [address] [place], [place] [address] [phone] (main office) [phone] (Camiah Humm's office)  New Parent Support Groups www.postpartum.net www.conehealthybaby.com  /Emotional Wellbeing Apps and Websites Here are a few free apps meant to help you to help yourself.  To find, try searching on the internet to see if the app is offered on Apple/Android devices. If your first choice doesn't come up on your device, the good news is that there are many choices! Play around with different apps to see which ones are helpful to you.    Calm This is an app meant to help increase calm feelings. Includes info, strategies, and tools for tracking your feelings.      Calm Harm  This app is meant to help with self-harm. Provides many 5-minute or 15-min coping strategies for doing instead of hurting yourself.       Healthy Minds Health Minds is a problem-solving tool to help deal with emotions and cope with stress you encounter wherever you are.      MindShift This app can help people cope with anxiety. Rather than trying to avoid anxiety, you can make an important shift and face it.      MY3  MY3 features a support system, safety plan and resources with the goal of offering a tool to use in a time of need.       My Life My Voice  This mood journal offers a simple solution for tracking your thoughts, feelings and moods. Animated emoticons can help identify your mood.       Relax Melodies Designed to help with sleep, on this app you can mix sounds and meditations for relaxation.      Smiling Mind Smiling Mind is meditation made easy: it's a simple tool that helps put a smile on your mind.        Stop, Breathe & Think  A friendly, simple guide for people through meditations for mindfulness and compassion.  Stop, Breathe and Think Kids Enter your current feelings and choose a "mission" to help you cope. Offers  videos for certain moods instead of just sound recordings.       Team Orange The goal of this tool is to help teens change how they think, act, and react. This app helps you focus on your own good feelings and experiences.      The Virtual Hope Box The Virtual Hope Box (VHB) contains simple tools to help patients with coping, relaxation, distraction, and positive thinking.     

## 2022-02-21 ENCOUNTER — Telehealth: Payer: Self-pay | Admitting: Internal Medicine

## 2022-02-21 ENCOUNTER — Inpatient Hospital Stay (HOSPITAL_BASED_OUTPATIENT_CLINIC_OR_DEPARTMENT_OTHER): Payer: Managed Care, Other (non HMO) | Admitting: Internal Medicine

## 2022-02-21 ENCOUNTER — Other Ambulatory Visit: Payer: Self-pay

## 2022-02-21 ENCOUNTER — Inpatient Hospital Stay: Payer: Managed Care, Other (non HMO) | Attending: Internal Medicine

## 2022-02-21 ENCOUNTER — Ambulatory Visit: Payer: Managed Care, Other (non HMO)

## 2022-02-21 ENCOUNTER — Encounter: Payer: Self-pay | Admitting: Internal Medicine

## 2022-02-21 VITALS — BP 115/69 | HR 78 | Temp 97.3°F | Resp 16 | Wt 154.3 lb

## 2022-02-21 DIAGNOSIS — O99013 Anemia complicating pregnancy, third trimester: Secondary | ICD-10-CM | POA: Insufficient documentation

## 2022-02-21 DIAGNOSIS — D508 Other iron deficiency anemias: Secondary | ICD-10-CM

## 2022-02-21 DIAGNOSIS — D509 Iron deficiency anemia, unspecified: Secondary | ICD-10-CM | POA: Insufficient documentation

## 2022-02-21 LAB — CBC WITH DIFFERENTIAL (CANCER CENTER ONLY)
Abs Immature Granulocytes: 0.02 10*3/uL (ref 0.00–0.07)
Basophils Absolute: 0 10*3/uL (ref 0.0–0.1)
Basophils Relative: 0 %
Eosinophils Absolute: 0.1 10*3/uL (ref 0.0–0.5)
Eosinophils Relative: 1 %
HCT: 36.7 % (ref 36.0–46.0)
Hemoglobin: 11.8 g/dL — ABNORMAL LOW (ref 12.0–15.0)
Immature Granulocytes: 0 %
Lymphocytes Relative: 19 %
Lymphs Abs: 1.3 10*3/uL (ref 0.7–4.0)
MCH: 29.4 pg (ref 26.0–34.0)
MCHC: 32.2 g/dL (ref 30.0–36.0)
MCV: 91.3 fL (ref 80.0–100.0)
Monocytes Absolute: 0.7 10*3/uL (ref 0.1–1.0)
Monocytes Relative: 10 %
Neutro Abs: 4.9 10*3/uL (ref 1.7–7.7)
Neutrophils Relative %: 70 %
Platelet Count: 203 10*3/uL (ref 150–400)
RBC: 4.02 MIL/uL (ref 3.87–5.11)
RDW: 19.8 % — ABNORMAL HIGH (ref 11.5–15.5)
WBC Count: 7 10*3/uL (ref 4.0–10.5)
nRBC: 0 % (ref 0.0–0.2)

## 2022-02-21 LAB — IRON AND IRON BINDING CAPACITY (CC-WL,HP ONLY)
Iron: 116 ug/dL (ref 28–170)
Saturation Ratios: 33 % — ABNORMAL HIGH (ref 10.4–31.8)
TIBC: 349 ug/dL (ref 250–450)
UIBC: 233 ug/dL (ref 148–442)

## 2022-02-21 LAB — FERRITIN: Ferritin: 19 ng/mL (ref 11–307)

## 2022-02-21 NOTE — Telephone Encounter (Signed)
Per 4/24 los called and spoke to pt about appointment.  Pt confirmed appointment ?

## 2022-02-21 NOTE — Progress Notes (Signed)
?    [facility] ?Telephone:([phone]) [phone]   [phone] ? ?OFFICE PROGRESS NOTE ? ?Patient, No Pcp Per (Inactive) ?No address on file ? ?DIAGNOSIS: Iron deficiency anemia during pregnancy. ? ?PRIOR THERAPY: None ? ?CURRENT THERAPY: Integra +1 capsule p.o. daily. ? ?INTERVAL HISTORY: ?Kathy Bird 36 y.o. female returns to the clinic today for follow-up visit.  The patient is feeling fine today with no concerning complaints.  She is tolerating the Integra plus fairly well.  She denied having any chest pain, shortness of breath, cough or hemoptysis.  She denied having any fever or chills.  She has no nausea, vomiting, diarrhea or constipation.  She is currently [redacted] weeks pregnant.  She is here today for evaluation and repeat blood work. ? ?MEDICAL HISTORY: ?Past Medical History:  ?Diagnosis Date  ? Anxiety   ? Depression   ? Pregnancy induced hypertension   ? Seizures (HCC)   ? as child. last seizure when 28yrs old  ? ? ?ALLERGIES:  is allergic to peanut-containing drug products and shellfish allergy. ? ?MEDICATIONS:  ?Current Outpatient Medications  ?Medication Sig Dispense Refill  ? acetaminophen (TYLENOL) 500 MG tablet Take 1,000 mg by mouth every 6 (six) hours as needed.    ? aspirin 81 MG chewable tablet Chew 1 tablet (81 mg total) by mouth daily. 30 tablet 3  ? Blood Pressure Monitoring DEVI 1 each by Does not apply route once a week. 1 each 0  ? buprenorphine (SUBUTEX) 2 MG SUBL SL tablet Place 2 mg under the tongue 2 (two) times daily as needed.    ? FeFum-FePoly-FA-B Cmp-C-Biot (INTEGRA PLUS) CAPS Take 1 capsule by mouth daily. 30 capsule 3  ? melatonin 1 MG TABS tablet Take 1 tablet (1 mg total) by mouth at bedtime. 30 tablet 6  ? naloxone (NARCAN) nasal spray 4 mg/0.1 mL Use as needed to reverse opioid overdose (Patient not taking: Reported on 01/21/2022) 2 each 1  ? ondansetron (ZOFRAN-ODT) 4 MG disintegrating tablet Take 1 tablet (4 mg total) by mouth every 6 (six) hours as needed for  nausea or vomiting. 20 tablet 2  ? ?No current facility-administered medications for this visit.  ? ? ?SURGICAL HISTORY:  ?Past Surgical History:  ?Procedure Laterality Date  ? ADENOIDECTOMY    ? ? ?REVIEW OF SYSTEMS:  A comprehensive review of systems was negative.  ? ?PHYSICAL EXAMINATION: General appearance: alert, cooperative, and no distress ?Head: Normocephalic, without obvious abnormality, atraumatic ?Neck: no adenopathy, no JVD, supple, symmetrical, trachea midline, and thyroid not enlarged, symmetric, no tenderness/mass/nodules ?Lymph nodes: Cervical, supraclavicular, and axillary nodes normal. ?Resp: clear to auscultation bilaterally ?Back: symmetric, no curvature. ROM normal. No CVA tenderness. ?Cardio: regular rate and rhythm, S1, S2 normal, no murmur, click, rub or gallop ?GI: soft, non-tender; bowel sounds normal; no masses,  no organomegaly ?Extremities: extremities normal, atraumatic, no cyanosis or edema ? ?ECOG PERFORMANCE STATUS: 0 - Asymptomatic ? ?Blood pressure 115/69, pulse 78, temperature (!) 97.3 ?F (36.3 ?C), temperature source Tympanic, resp. rate 16, weight 154 lb 4.8 oz (70 kg), last menstrual period 04/04/2021, SpO2 100 %. ? ?LABORATORY DATA: ?Lab Results  ?Component Value Date  ? WBC 7.0 02/21/2022  ? HGB 11.8 (L) 02/21/2022  ? HCT 36.7 02/21/2022  ? MCV 91.3 02/21/2022  ? PLT 203 02/21/2022  ? ? ?  Chemistry   ?   ?Component Value Date/Time  ? NA 137 12/20/2021 1150  ? K 3.7 12/20/2021 1150  ? CL 107 12/20/2021  1150  ? CO2 24 12/20/2021 1150  ? BUN 10 12/20/2021 1150  ? CREATININE 0.58 12/20/2021 1150  ?    ?Component Value Date/Time  ? CALCIUM 8.9 12/20/2021 1150  ? ALKPHOS 53 12/20/2021 1150  ? AST 12 (L) 12/20/2021 1150  ? ALT 13 12/20/2021 1150  ? BILITOT 0.3 12/20/2021 1150  ?  ? ? ? ?RADIOGRAPHIC STUDIES: ?No results found. ? ?ASSESSMENT AND PLAN: This is a very pleasant 36 years old African-American female who is [redacted] weeks pregnant and has iron deficiency anemia of  pregnancy. ?The patient started treatment with Integra +1 capsule p.o. daily and has been tolerating it fairly well with no concerning adverse effects. ?Repeat CBC today showed improvement of her hemoglobin up to 11.8 and hematocrit 36.7%. ?I recommended for the patient to continue her current treatment with Integra +1 capsule p.o. daily. ?I will see her back for follow-up visit in 3 months for evaluation and repeat blood work. ?She was advised to call immediately if she has any other concerning symptoms in the interval. ?The patient voices understanding of current disease status and treatment options and is in agreement with the current care plan. ? ?All questions were answered. The patient knows to call the clinic with any problems, questions or concerns. We can certainly see the patient much sooner if necessary. ? ?The total time spent in the appointment was 20 minutes. ? ?Disclaimer: This note was dictated with voice recognition software. Similar sounding words can inadvertently be transcribed and may not be corrected upon review. ? ? ?  ?   ?

## 2022-02-22 ENCOUNTER — Encounter: Payer: Self-pay | Admitting: Family Medicine

## 2022-02-22 ENCOUNTER — Ambulatory Visit (INDEPENDENT_AMBULATORY_CARE_PROVIDER_SITE_OTHER): Payer: Managed Care, Other (non HMO) | Admitting: Family Medicine

## 2022-02-22 VITALS — BP 117/69 | HR 91 | Wt 155.7 lb

## 2022-02-22 DIAGNOSIS — Z3480 Encounter for supervision of other normal pregnancy, unspecified trimester: Secondary | ICD-10-CM | POA: Diagnosis not present

## 2022-02-22 DIAGNOSIS — Z8759 Personal history of other complications of pregnancy, childbirth and the puerperium: Secondary | ICD-10-CM

## 2022-02-22 DIAGNOSIS — F332 Major depressive disorder, recurrent severe without psychotic features: Secondary | ICD-10-CM

## 2022-02-22 DIAGNOSIS — Z23 Encounter for immunization: Secondary | ICD-10-CM

## 2022-02-22 DIAGNOSIS — F1110 Opioid abuse, uncomplicated: Secondary | ICD-10-CM

## 2022-02-22 MED ORDER — SERTRALINE HCL 50 MG PO TABS: 50.0000 mg | ORAL_TABLET | Freq: Every day | ORAL | 2 refills | Status: DC

## 2022-02-22 MED ORDER — HYDROXYZINE PAMOATE 25 MG PO CAPS: 25.0000 mg | ORAL_CAPSULE | Freq: Three times a day (TID) | ORAL | 2 refills | Status: DC | PRN

## 2022-02-22 NOTE — Progress Notes (Signed)
? ?  Subjective:  ?Kathy Bird is a 36 y.o. G4P1021 at [redacted]w[redacted]d being seen today for ongoing prenatal care.  She is currently monitored for the following issues for this high-risk pregnancy and has MDD (major depressive disorder), recurrent severe, without psychosis ([place]); Bipolar I disorder, most recent episode mixed ([place]); Generalized anxiety disorder; Attention deficit hyperactivity disorder (ADHD), predominantly inattentive type; Miscarriage; Iron deficiency anemia; Supervision of other normal pregnancy, antepartum; Opioid use disorder, mild, abuse ([place]); and History of pre-eclampsia on their problem list. ? ?Patient reports no complaints.  Contractions: Not present. Vag. Bleeding: None.  Movement: Present. Denies leaking of fluid.  ? ?The following portions of the patient's history were reviewed and updated as appropriate: allergies, current medications, past family history, past medical history, past social history, past surgical history and problem list. Problem list updated. ? ?Objective:  ? ?Vitals:  ? 02/22/22 0851  ?BP: 117/69  ?Pulse: 91  ?Weight: 155 lb 11.2 oz (70.6 kg)  ? ? ?Fetal Status: Fetal Heart Rate (bpm): 144   Movement: Present    ? ?General:  Alert, oriented and cooperative. Patient is in no acute distress.  ?Skin: Skin is warm and dry. No rash noted.   ?Cardiovascular: Normal heart rate noted  ?Respiratory: Normal respiratory effort, no problems with respiration noted  ?Abdomen: Soft, gravid, appropriate for gestational age. Pain/Pressure: Absent     ?Pelvic: Vag. Bleeding: None     ?Cervical exam deferred        ?Extremities: Normal range of motion.  Edema: Trace  ?Mental Status: Normal mood and affect. Normal behavior. Normal judgment and thought content.  ? ?Urinalysis:     ? ?Assessment and Plan:  ?Pregnancy: [id number] at [redacted]w[redacted]d ? ?1. Supervision of other normal pregnancy, antepartum ?BP and FHR normal ?Accepts TDaP today ?- Tdap vaccine greater than or equal to 7yo IM ? ?2. Opioid use  disorder, mild, abuse ([place]) ?On 2 mg TID of subutex, reported some nausea last visit, unclear if needed uptitration or just pregnancy related so given rx for zofran and told to speak with provider at [facility] ?Today reports much improved with zofran ?NAS referral palced today ? ?3. MDD (major depressive disorder), recurrent severe, without psychosis ([place]) ?Worsening symptoms, no SI ?Would like to start zoloft, will do 25mg  x1 week then go to 50mg , take QHS to avoid GI side effets ?Vistaril as well for spot anxiety symptoms ?Seeing [place] clinician later today ? ?4. History of pre-eclampsia ?Normotensive, on ASA ? ?Preterm labor symptoms and general obstetric precautions including but not limited to vaginal bleeding, contractions, leaking of fluid and fetal movement were reviewed in detail with the patient. ?Please refer to After Visit Summary for other counseling recommendations.  ?Return in 2 weeks (on 03/08/2022). ? ? ?Clarnce Flock, MD ? ?

## 2022-02-22 NOTE — Patient Instructions (Signed)

## 2022-02-26 ENCOUNTER — Other Ambulatory Visit: Payer: Self-pay | Admitting: Family Medicine

## 2022-02-26 DIAGNOSIS — F332 Major depressive disorder, recurrent severe without psychotic features: Secondary | ICD-10-CM

## 2022-03-02 ENCOUNTER — Other Ambulatory Visit: Payer: Self-pay | Admitting: Internal Medicine

## 2022-03-02 DIAGNOSIS — Z3480 Encounter for supervision of other normal pregnancy, unspecified trimester: Secondary | ICD-10-CM

## 2022-03-02 MED ORDER — INTEGRA PLUS PO CAPS: 1.0000 | ORAL_CAPSULE | Freq: Every day | ORAL | 3 refills | Status: DC

## 2022-03-02 NOTE — BH Specialist Note (Signed)
Integrated Behavioral Health via Telemedicine Visit ? ?03/16/2022 ?RICHELE STRAND ?[id number] ? ?Number of Integrated Behavioral Health Clinician visits: 4- Fourth Visit ? ?Session Start time: 55 ?  ?Session End time: 1611 ? ?Total time in minutes: 6 ? ? ?Referring Provider: Merian Capron, MD ?Patient/Family location: Home ?[facility] Provider location: [facility] [employer] [facility] ? ?All persons participating in visit: [name] and [facility] Asher Muir Copeland Lapier  ? ?Types of Service: Telephone visit ? ?I connected with Kathy Bird and/or Kathy Bird's  n/a  via  Telephone or Video Enabled Telemedicine Application  (Video is Caregility application) and verified that I am speaking with the correct person using two identifiers. Discussed confidentiality: Yes  ? ?I discussed the limitations of telemedicine and the availability of in person appointments.  Discussed there is a possibility of technology failure and discussed alternative modes of communication if that failure occurs. ? ?I discussed that engaging in this telemedicine visit, they consent to the provision of behavioral healthcare and the services will be billed under their insurance. ? ?Patient and/or legal guardian expressed understanding and consented to Telemedicine visit: Yes ; Consents to only very brief visit as she is currently at work. ? ?Presenting Concerns: ?Patient and/or family reports the following symptoms/concerns: Primary concern today is noticing feeling "like a panic attack"; this feeling is only happening after her left ankle and foot is swelling. Pt requests any medical advise available.  ? ?Patient and/or Family Response: Patient agrees with treatment plan. ? ? ?Assessment: ?Patient currently experiencing Other specified counseling today.  ? ?Patient may benefit from brief assessment and refer for medical advise. ? ?Plan: ?Follow up with behavioral health clinician on : Call Valia Wingard at [phone], as  needed; will follow up about two weeks postpartum for mood check, or as needed prior to that time. ?Behavioral recommendations:  ?-Continue taking prenatal vitamin and melatonin daily as recommended by medical providers; continue [facility] medication management with [facility] ?-Continue using self-coping strategies to manage emotions remaining pregnancy ?Referral(s): Integrated [employer] (In Clinic) ? ?I discussed the assessment and treatment plan with the patient and/or parent/guardian. They were provided an opportunity to ask questions and all were answered. They agreed with the plan and demonstrated an understanding of the instructions. ?  ?They were advised to call back or seek an in-person evaluation if the symptoms worsen or if the condition fails to improve as anticipated. ? ?Rae Lips, LCSW ? ? ?  02/16/2022  ?  4:08 PM 01/21/2022  ? 11:08 AM 12/15/2021  ?  2:40 PM 06/16/2021  ? 11:24 AM 03/16/2021  ?  3:17 PM  ?Depression screen PHQ 2/9  ?Decreased Interest 0 1 2    ?Down, Depressed, Hopeless 1 1 1     ?PHQ - 2 Score 1 2 3     ?Altered sleeping 3 3 1     ?Tired, decreased energy 3 3 1     ?Change in appetite 0 2 0    ?Feeling bad or failure about yourself  0 3 2    ?Trouble concentrating 3 3 0    ?Moving slowly or fidgety/restless 0 0 2    ?Suicidal thoughts 0 0 0    ?PHQ-9 Score 10 16 9     ?Difficult doing work/chores       ?  ? Information is confidential and restricted. Go to Review Flowsheets to unlock data.  ? ? ?  02/16/2022  ?  4:11 PM 01/21/2022  ?  11:09 AM 12/15/2021  ?  2:40 PM 06/16/2021  ? 11:27 AM  ?GAD 7 : Generalized Anxiety Score  ?Nervous, Anxious, on Edge 3 3 1    ?Control/stop worrying 0 3 1   ?Worry too much - different things 2 3 3    ?Trouble relaxing 3 3 3    ?Restless 1 3 1    ?Easily annoyed or irritable 1 1 2    ?Afraid - awful might happen 1 3 3    ?Total GAD 7 Score 11 19 14    ?Anxiety Difficulty      ?  ? Information is confidential and restricted. Go to Review Flowsheets to  unlock data.  ? ? ? ?

## 2022-03-04 ENCOUNTER — Encounter: Payer: Self-pay | Admitting: *Deleted

## 2022-03-04 ENCOUNTER — Ambulatory Visit: Payer: Managed Care, Other (non HMO) | Attending: Obstetrics

## 2022-03-04 ENCOUNTER — Ambulatory Visit: Payer: Managed Care, Other (non HMO) | Admitting: *Deleted

## 2022-03-04 VITALS — BP 118/73 | HR 82

## 2022-03-04 DIAGNOSIS — F1120 Opioid dependence, uncomplicated: Secondary | ICD-10-CM

## 2022-03-04 DIAGNOSIS — G40909 Epilepsy, unspecified, not intractable, without status epilepticus: Secondary | ICD-10-CM

## 2022-03-04 DIAGNOSIS — O09293 Supervision of pregnancy with other poor reproductive or obstetric history, third trimester: Secondary | ICD-10-CM | POA: Diagnosis not present

## 2022-03-04 DIAGNOSIS — O99323 Drug use complicating pregnancy, third trimester: Secondary | ICD-10-CM | POA: Diagnosis present

## 2022-03-04 DIAGNOSIS — Z3A34 34 weeks gestation of pregnancy: Secondary | ICD-10-CM

## 2022-03-04 DIAGNOSIS — O99353 Diseases of the nervous system complicating pregnancy, third trimester: Secondary | ICD-10-CM | POA: Diagnosis not present

## 2022-03-04 DIAGNOSIS — O99320 Drug use complicating pregnancy, unspecified trimester: Secondary | ICD-10-CM | POA: Insufficient documentation

## 2022-03-08 ENCOUNTER — Encounter: Payer: Managed Care, Other (non HMO) | Admitting: Family Medicine

## 2022-03-16 ENCOUNTER — Ambulatory Visit (INDEPENDENT_AMBULATORY_CARE_PROVIDER_SITE_OTHER): Payer: Managed Care, Other (non HMO) | Admitting: Clinical

## 2022-03-16 DIAGNOSIS — Z7189 Other specified counseling: Secondary | ICD-10-CM

## 2022-03-21 ENCOUNTER — Telehealth: Payer: Self-pay

## 2022-03-21 NOTE — Telephone Encounter (Signed)
Call placed to pt. Spoke with pt. Pt states swelling has gone down and is now not concerned. Pt states is concerned with possible "panic attacks" pt states is taking Rx hydroxyzine as prescribed, but still feels like "heart is still  racing" . Pt also has Rx Zoloft but has not picked up from pharmacy. Advised to continue regimen and will follow up with all these concerns tomorrow at [facility] appt with Dr Crissie Reese. Pt verbalized understanding and agreeable to plan of care.  [name]

## 2022-03-21 NOTE — Telephone Encounter (Signed)
-----   Message from Clarnce Flock, MD sent at 03/17/2022 10:31 AM EDT ----- Will need to be seen in person, low suspicion for PE but that would be main thing I'm worried about, but generally would not present like this. Already has an appt scheduled for next week.  ----- Message ----- From: Garlan Fair, LCSW Sent: 03/16/2022   4:21 PM EDT To: Clarnce Flock, MD  Pt concerned that her left ankle/foot is swelling; noticing feeling "like a panic attack" only when swelling in ankle/foot occurs. She doesn't have feeling of panic attack when there is no swelling.  Please advise.

## 2022-03-22 ENCOUNTER — Encounter: Payer: Self-pay | Admitting: Family Medicine

## 2022-03-22 ENCOUNTER — Ambulatory Visit (HOSPITAL_COMMUNITY)
Admission: RE | Admit: 2022-03-22 | Discharge: 2022-03-22 | Disposition: A | Discharge status: Home / Self Care | Payer: Managed Care, Other (non HMO) | Source: Ambulatory Visit | Attending: Family Medicine | Admitting: Family Medicine

## 2022-03-22 ENCOUNTER — Other Ambulatory Visit (HOSPITAL_COMMUNITY)
Admission: RE | Admit: 2022-03-22 | Discharge: 2022-03-22 | Disposition: A | Discharge status: Home / Self Care | Payer: Managed Care, Other (non HMO) | Source: Ambulatory Visit

## 2022-03-22 ENCOUNTER — Other Ambulatory Visit: Payer: Self-pay | Admitting: Family Medicine

## 2022-03-22 ENCOUNTER — Ambulatory Visit (INDEPENDENT_AMBULATORY_CARE_PROVIDER_SITE_OTHER): Payer: Managed Care, Other (non HMO) | Admitting: Family Medicine

## 2022-03-22 VITALS — BP 115/75 | HR 74 | Wt 163.2 lb

## 2022-03-22 DIAGNOSIS — Z3A00 Weeks of gestation of pregnancy not specified: Secondary | ICD-10-CM | POA: Insufficient documentation

## 2022-03-22 DIAGNOSIS — Z3480 Encounter for supervision of other normal pregnancy, unspecified trimester: Secondary | ICD-10-CM

## 2022-03-22 DIAGNOSIS — R0602 Shortness of breath: Secondary | ICD-10-CM | POA: Insufficient documentation

## 2022-03-22 DIAGNOSIS — O99513 Diseases of the respiratory system complicating pregnancy, third trimester: Secondary | ICD-10-CM | POA: Insufficient documentation

## 2022-03-22 DIAGNOSIS — O26893 Other specified pregnancy related conditions, third trimester: Secondary | ICD-10-CM | POA: Insufficient documentation

## 2022-03-22 DIAGNOSIS — M7989 Other specified soft tissue disorders: Secondary | ICD-10-CM | POA: Insufficient documentation

## 2022-03-22 DIAGNOSIS — O321XX0 Maternal care for breech presentation, not applicable or unspecified: Secondary | ICD-10-CM | POA: Insufficient documentation

## 2022-03-22 DIAGNOSIS — Z8759 Personal history of other complications of pregnancy, childbirth and the puerperium: Secondary | ICD-10-CM

## 2022-03-22 DIAGNOSIS — F1110 Opioid abuse, uncomplicated: Secondary | ICD-10-CM

## 2022-03-22 DIAGNOSIS — D509 Iron deficiency anemia, unspecified: Secondary | ICD-10-CM

## 2022-03-22 DIAGNOSIS — F332 Major depressive disorder, recurrent severe without psychotic features: Secondary | ICD-10-CM

## 2022-03-22 MED ORDER — BUPRENORPHINE HCL 2 MG SL SUBL
2.0000 mg | SUBLINGUAL_TABLET | Freq: Two times a day (BID) | SUBLINGUAL | 0 refills | Status: DC
  Filled 2022-03-22: qty 28, 14d supply, fill #0

## 2022-03-22 NOTE — Progress Notes (Signed)
   Subjective:  Kathy Bird is a 36 y.o. G4P1021 at [redacted]w[redacted]d being seen today for ongoing prenatal care.  She is currently monitored for the following issues for this high-risk pregnancy and has MDD (major depressive disorder), recurrent severe, without psychosis ([place]); Bipolar I disorder, most recent episode mixed ([place]); Generalized anxiety disorder; Attention deficit hyperactivity disorder (ADHD), predominantly inattentive type; Miscarriage; Iron deficiency anemia; Supervision of other normal pregnancy, antepartum; Opioid use disorder, mild, abuse ([place]); and History of pre-eclampsia on their problem list.  Patient reports  swelling of L leg .  Contractions: Irritability. Vag. Bleeding: None.  Movement: Present. Denies leaking of fluid.   The following portions of the patient's history were reviewed and updated as appropriate: allergies, current medications, past family history, past medical history, past social history, past surgical history and problem list. Problem list updated.  Objective:   Vitals:   03/22/22 0836  BP: 115/75  Pulse: 74  Weight: 163 lb 3.2 oz (74 kg)    Fetal Status: Fetal Heart Rate (bpm): 146   Movement: Present     General:  Alert, oriented and cooperative. Patient is in no acute distress.  Skin: Skin is warm and dry. No rash noted.   Cardiovascular: Normal heart rate noted  Respiratory: Normal respiratory effort, no problems with respiration noted  Abdomen: Soft, gravid, appropriate for gestational age. Pain/Pressure: Present     Pelvic: Vag. Bleeding: None     Cervical exam deferred        Extremities: Normal range of motion.     Mental Status: Normal mood and affect. Normal behavior. Normal judgment and thought content.   Urinalysis:      Assessment and Plan:  Pregnancy: G4P1021 at [redacted]w[redacted]d  1. Supervision of other normal pregnancy, antepartum BP and FHR normal Swabs collected today Breech on bedside US, see below - GC/Chlamydia probe amp ([place])not  at [facility] - Culture, beta strep (group b only)  2. Opioid use disorder, mild, abuse (HCC) Taken down to 1.5mg  TID, she's not sure why I discussed not recommended to go down, may even need to go up actually Only has two tablets left, discussed if she does not hear from clinic later today to let me know and I will send a refill  3. MDD (major depressive disorder), recurrent severe, without psychosis ([place]) Started on zoloft last visit as well as vistaril Has not yet started zoloft Is taking vistaril, it helps a lot  4. Iron deficiency anemia, unspecified iron deficiency anemia type Resolved after IV iron  5. History of pre-eclampsia On ASA  6. Breech presentation Discussed spinning babies Reviewed options of ECV, vaginal breech delivery, scheduled cesarean Will proceed in that order ECV scheduled for 03/24/2022 Advised to be NPO  7. Left left swelling Concerning for DVT US scheduled for 1600 today Discussed MAU warning signs of chest pain, SOB Also discussed need to go to MAU if [place] is positive for DVT so she can get treatment  Preterm labor symptoms and general obstetric precautions including but not limited to vaginal bleeding, contractions, leaking of fluid and fetal movement were reviewed in detail with the patient. Please refer to After Visit Summary for other counseling recommendations.  Return in 1 week (on 03/29/2022) for [facility], ob visit.   Clarnce Flock, MD

## 2022-03-22 NOTE — Patient Instructions (Signed)

## 2022-03-23 ENCOUNTER — Telehealth (HOSPITAL_COMMUNITY): Payer: Self-pay | Admitting: *Deleted

## 2022-03-23 ENCOUNTER — Encounter (HOSPITAL_COMMUNITY): Payer: Self-pay | Admitting: *Deleted

## 2022-03-23 ENCOUNTER — Other Ambulatory Visit (HOSPITAL_COMMUNITY): Payer: Self-pay

## 2022-03-23 ENCOUNTER — Encounter: Payer: Self-pay | Admitting: Family Medicine

## 2022-03-23 ENCOUNTER — Other Ambulatory Visit: Payer: Self-pay | Admitting: Advanced Practice Midwife

## 2022-03-23 ENCOUNTER — Other Ambulatory Visit: Payer: Self-pay

## 2022-03-23 ENCOUNTER — Other Ambulatory Visit: Payer: Self-pay | Admitting: Family Medicine

## 2022-03-23 DIAGNOSIS — Z3480 Encounter for supervision of other normal pregnancy, unspecified trimester: Secondary | ICD-10-CM

## 2022-03-23 DIAGNOSIS — O321XX0 Maternal care for breech presentation, not applicable or unspecified: Secondary | ICD-10-CM

## 2022-03-23 LAB — GC/CHLAMYDIA PROBE AMP (~~LOC~~) NOT AT ARMC
Chlamydia: NEGATIVE
Comment: NEGATIVE
Comment: NORMAL
Neisseria Gonorrhea: NEGATIVE

## 2022-03-23 MED ORDER — BUPRENORPHINE HCL 2 MG SL SUBL
2.0000 mg | SUBLINGUAL_TABLET | Freq: Two times a day (BID) | SUBLINGUAL | 0 refills | Status: DC
Start: 2022-03-23 — End: 2022-03-23

## 2022-03-23 MED ORDER — BUPRENORPHINE HCL 2 MG SL SUBL: 2.0000 mg | SUBLINGUAL_TABLET | Freq: Two times a day (BID) | SUBLINGUAL | 0 refills | Status: DC

## 2022-03-23 NOTE — Addendum Note (Signed)
Addended by: Merian Capron on: 03/23/2022 11:40 AM   Modules accepted: Orders

## 2022-03-23 NOTE — Telephone Encounter (Signed)
Preadmission screen  

## 2022-03-24 ENCOUNTER — Encounter (HOSPITAL_COMMUNITY): Payer: Self-pay | Admitting: Family Medicine

## 2022-03-24 ENCOUNTER — Other Ambulatory Visit: Payer: Self-pay

## 2022-03-24 ENCOUNTER — Observation Stay (HOSPITAL_COMMUNITY): Admission: RE | Admit: 2022-03-24 | Payer: Managed Care, Other (non HMO) | Source: Ambulatory Visit

## 2022-03-24 ENCOUNTER — Encounter (HOSPITAL_COMMUNITY): Payer: Self-pay

## 2022-03-24 ENCOUNTER — Observation Stay (HOSPITAL_COMMUNITY)
Admission: AD | Admit: 2022-03-24 | Discharge: 2022-03-24 | Disposition: A | Discharge status: Home / Self Care | Payer: Managed Care, Other (non HMO) | Attending: Family Medicine | Admitting: Family Medicine

## 2022-03-24 DIAGNOSIS — F17210 Nicotine dependence, cigarettes, uncomplicated: Secondary | ICD-10-CM | POA: Insufficient documentation

## 2022-03-24 DIAGNOSIS — O99333 Smoking (tobacco) complicating pregnancy, third trimester: Secondary | ICD-10-CM | POA: Insufficient documentation

## 2022-03-24 DIAGNOSIS — Z3A37 37 weeks gestation of pregnancy: Secondary | ICD-10-CM | POA: Insufficient documentation

## 2022-03-24 DIAGNOSIS — O321XX0 Maternal care for breech presentation, not applicable or unspecified: Principal | ICD-10-CM | POA: Diagnosis not present

## 2022-03-24 DIAGNOSIS — Z91010 Allergy to peanuts: Secondary | ICD-10-CM | POA: Diagnosis not present

## 2022-03-24 LAB — CBC
HCT: 36.4 % (ref 36.0–46.0)
Hemoglobin: 12 g/dL (ref 12.0–15.0)
MCH: 31.2 pg (ref 26.0–34.0)
MCHC: 33 g/dL (ref 30.0–36.0)
MCV: 94.5 fL (ref 80.0–100.0)
Platelets: 212 10*3/uL (ref 150–400)
RBC: 3.85 MIL/uL — ABNORMAL LOW (ref 3.87–5.11)
RDW: 15.8 % — ABNORMAL HIGH (ref 11.5–15.5)
WBC: 6.8 10*3/uL (ref 4.0–10.5)
nRBC: 0 % (ref 0.0–0.2)

## 2022-03-24 IMAGING — US US OB < 14 WEEKS - US OB TV
1 series · 15 of 28 positions shown · non-contrast
Comparison: None.

CLINICAL DATA: Vaginal bleeding

EXAM:
OBSTETRIC <14 WK US AND TRANSVAGINAL OB US
TECHNIQUE: Both transabdominal and transvaginal ultrasound examinations were
performed for complete evaluation of the gestation as well as the
maternal uterus, adnexal regions, and pelvic cul-de-sac.
Transvaginal technique was performed to assess early pregnancy.

[Series 1: us ob < 14 weeks - us ob tv · 15 of 45 slices shown]
[im 1/45]
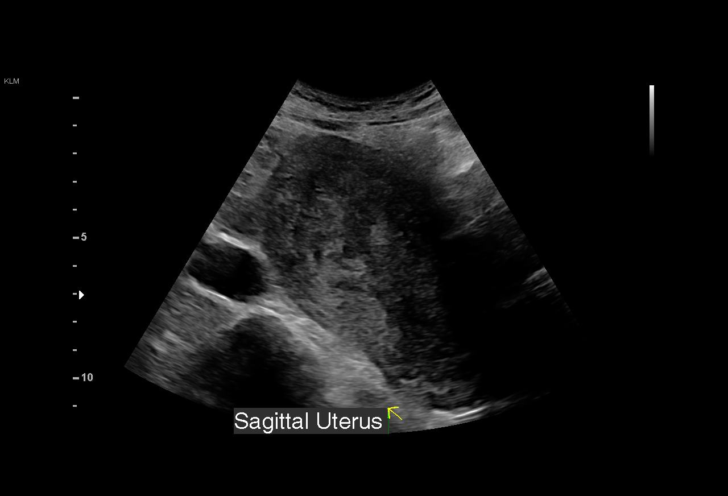
[im 4/45]
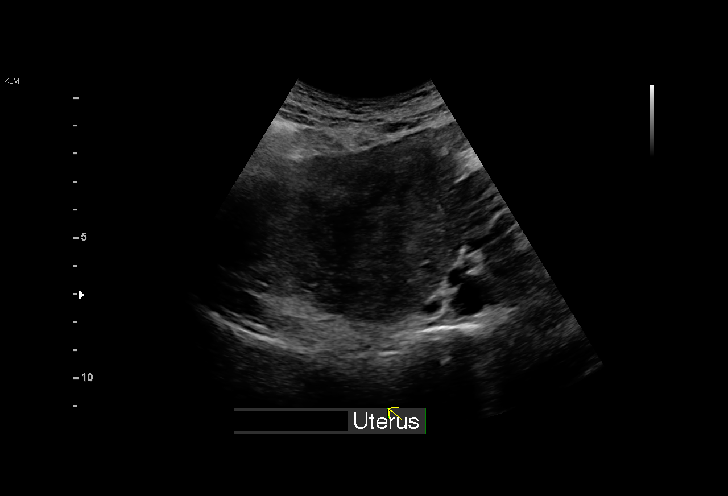
[im 7/45]
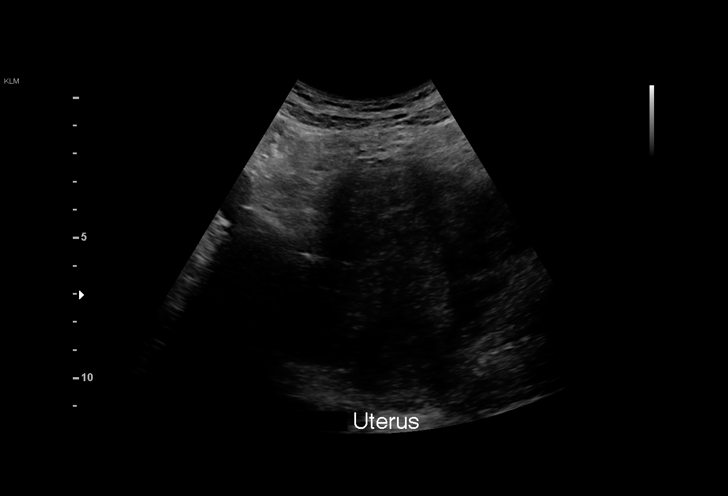
[im 10/45]
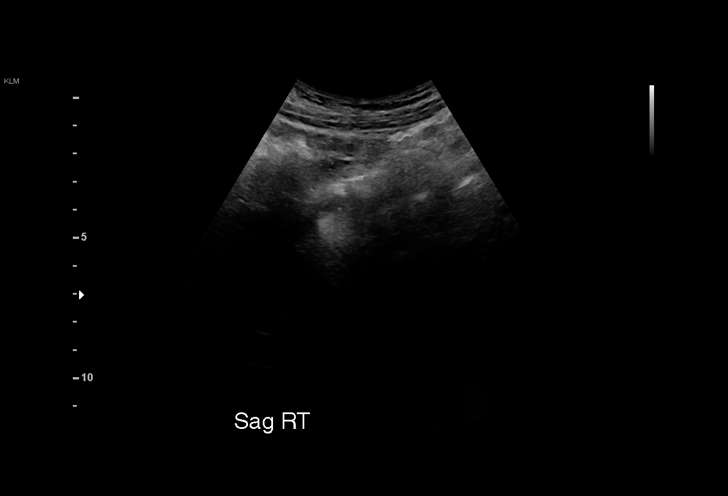
[im 14/45]
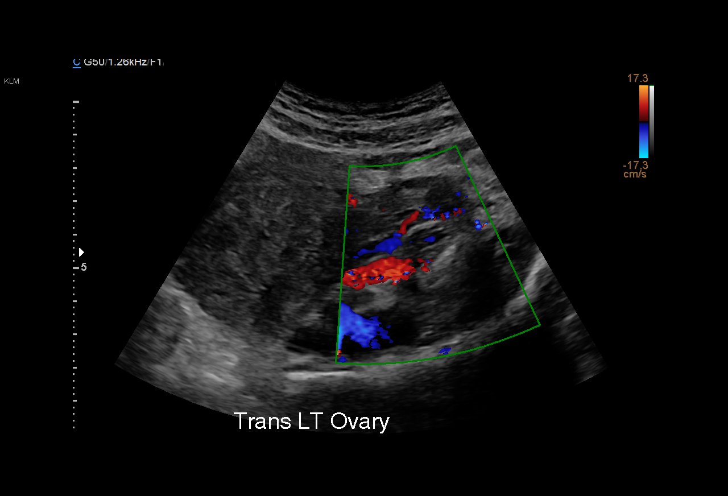
[im 17/45]
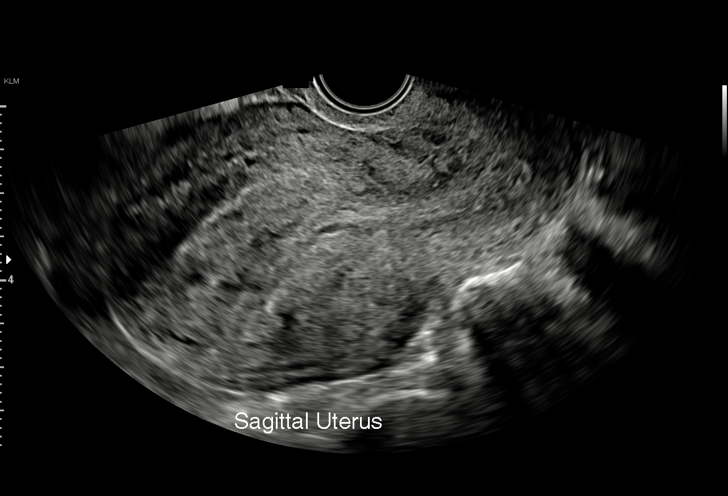
[im 20/45]
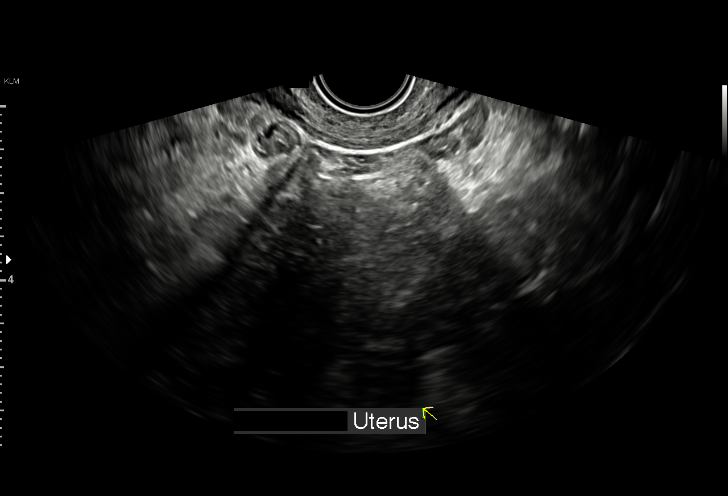
[im 23/45]
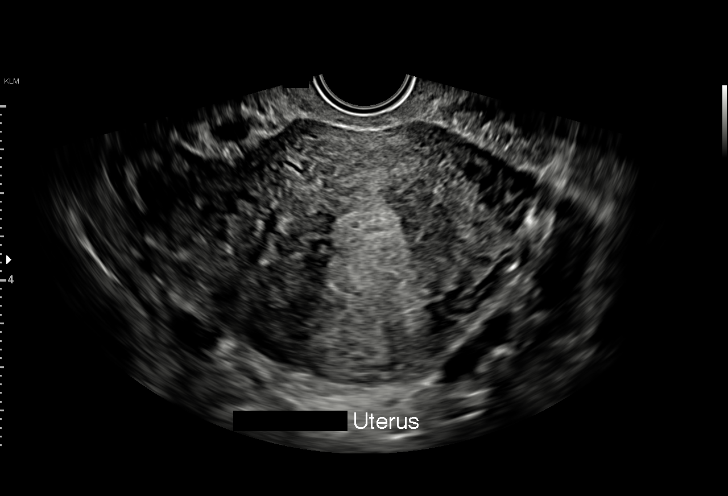
[im 25/45]
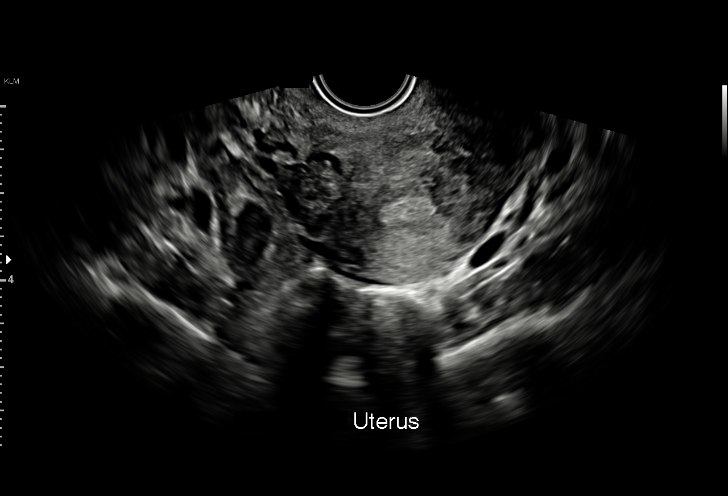
[im 28/45]
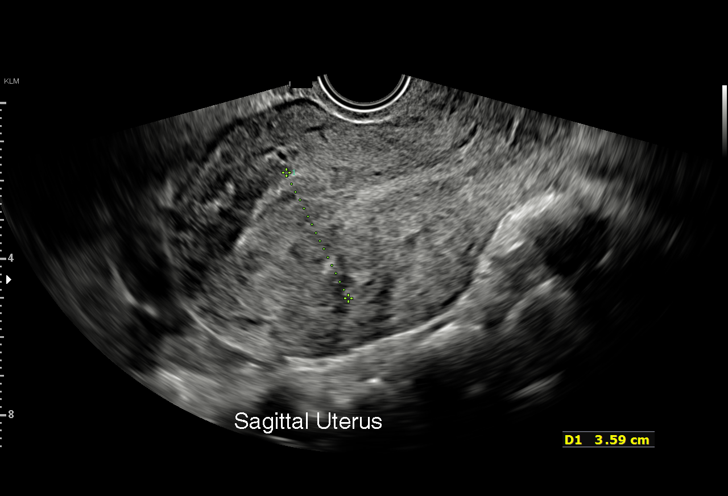
[im 31/45]
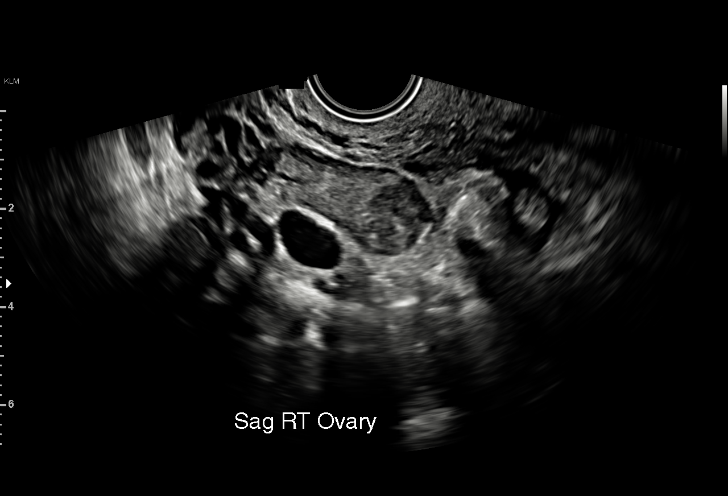
[im 35/45]
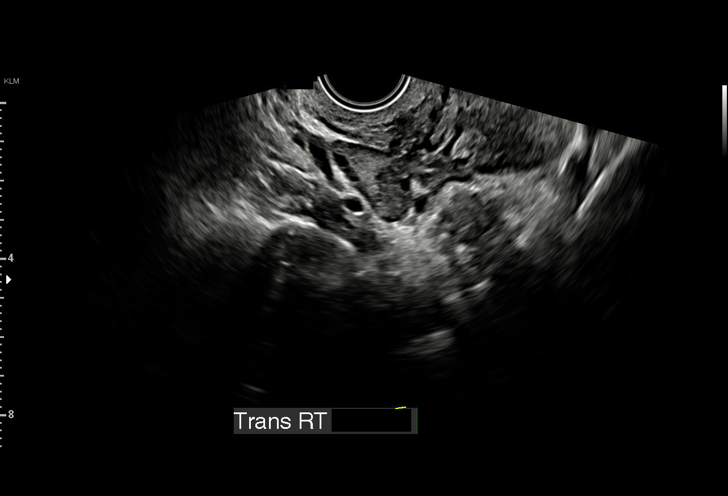
[im 38/45]
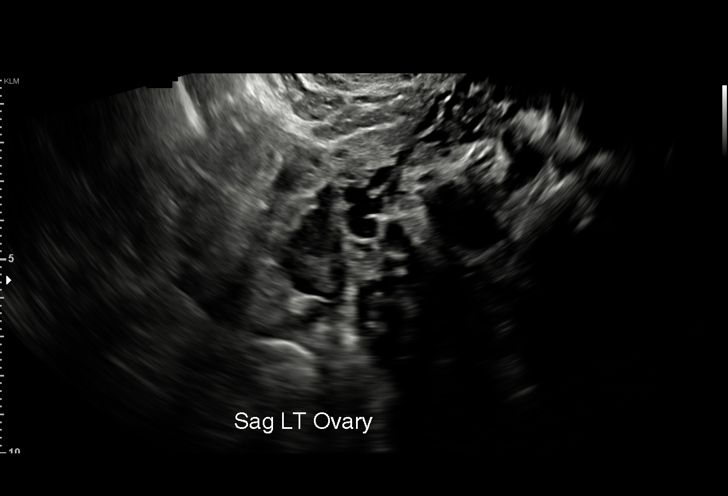
[im 41/45]
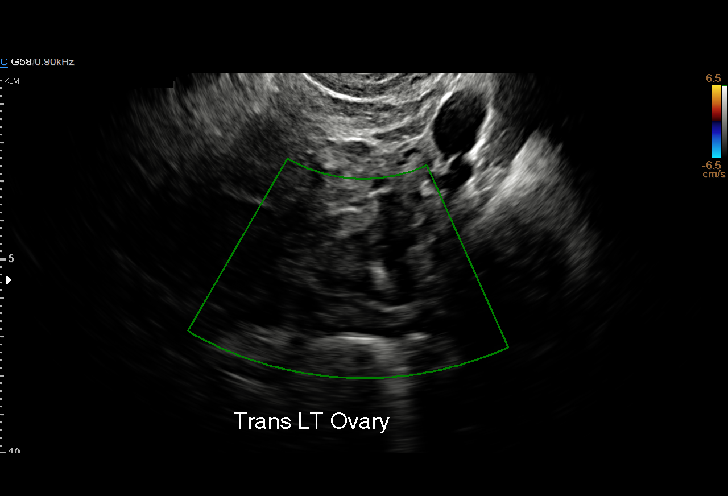
[im 45/45]
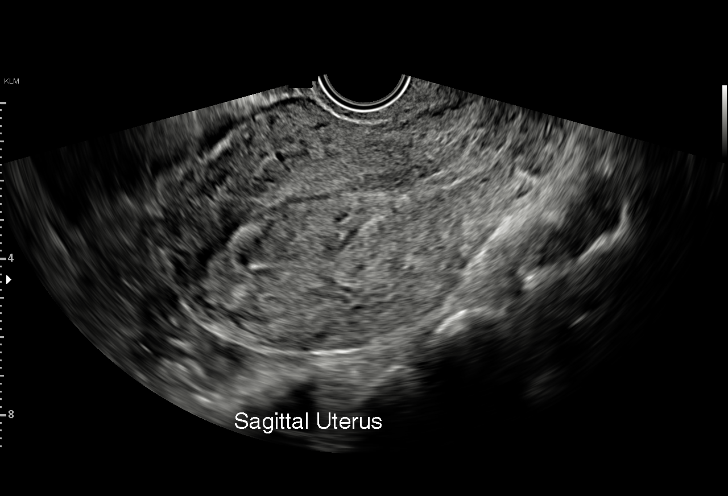

[15 of 28 positions shown; findings below may reference images not displayed]

FINDINGS: Intrauterine gestational sac: Absent

Maternal uterus/adnexae: Uterus appears within normal limits.
Ovaries appear within normal limits. No free fluid is noted.
IMPRESSION: No evidence of intrauterine gestation. Follow-up imaging can be
performed as clinically indicated.

## 2022-03-24 MED ORDER — LACTATED RINGERS IV SOLN: INTRAVENOUS | Status: DC

## 2022-03-24 MED ORDER — TERBUTALINE SULFATE 1 MG/ML IJ SOLN
INTRAMUSCULAR | Status: AC
  Filled 2022-03-24: qty 1

## 2022-03-24 MED ORDER — TERBUTALINE SULFATE 1 MG/ML IJ SOLN: 0.2500 mg | Freq: Once | INTRAMUSCULAR | Status: DC

## 2022-03-24 NOTE — H&P (Signed)
OBSTETRIC ADMISSION HISTORY AND PHYSICAL  Kathy Bird is a 36 y.o. female [phone] with IUP at [redacted]w[redacted]d by LMP presenting for breech, desiring ECV. She reports +FMs, No LOF, no VB, no blurry vision, headaches or peripheral edema, and RUQ pain.  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 last seizure when 89yrs old    Past Surgical History: Past Surgical History:  Procedure Laterality Date   ADENOIDECTOMY      Obstetrical History: OB History     Gravida  4   Para  1   Term  1   Preterm  0   AB  2   Living  1      SAB  0   IAB  2   Ectopic  0   Multiple  0   Live Births  1        Obstetric Comments  Pre-eclampsia  with delivery         Social History Social History   Socioeconomic History   Marital status: Divorced    Spouse name: Not on file   Number of children: Not on file   Years of education: Not on file   Highest education level: Not on file  Occupational History   Not on file  Tobacco Use   Smoking status: Every Day    Packs/day: 0.25    Years: 8.00    Pack years: 2.00    Types: Cigarettes   Smokeless tobacco: Never  Vaping Use   Vaping Use: Never used  Substance and Sexual Activity   Alcohol use: Not Currently    Alcohol/week: 3.0 standard drinks    Types: 3 Cans of beer per week    Comment: Social   Drug use: Not Currently    Types: Other-see comments    Comment: No Rx drugs since 11/18/2021   Sexual activity: Yes    Birth control/protection: None  Other Topics Concern   Not on file  Social History Narrative   Not on file   Social Determinants of Health   Financial Resource Strain: Not on file  Food Insecurity: No Food Insecurity    Worried About Running Out of Food in the Last Year: Never true   Ran Out of Food in the Last Year: Never true  Transportation Needs: No Transportation Needs   Lack of Transportation (Medical): No   Lack of Transportation (Non-Medical): No  Physical Activity: Not on file  Stress: Not on file  Social Connections: Not on file    Family History: Family History  Problem Relation Age of Onset   Cancer Mother    Healthy Mother     Allergies: Allergies  Allergen Reactions   Peanut-Containing Drug Products Hives   Shellfish Allergy Hives, Itching and Swelling    Tongue swelling also    No medications prior to admission.     Review of Systems   All systems reviewed and negative except as stated in HPI  Blood pressure 110/73, pulse 88, temperature 97.8 F (36.6 C), temperature source Oral, resp. rate 16, height 5\' 4"  (1.626 m), weight 73.9 kg, last menstrual period 04/04/2021, SpO2 100 %. General  appearance: alert, cooperative, and appears stated age Lungs: clear to auscultation bilaterally Heart: regular rate and rhythm Abdomen: soft, non-tender; bowel sounds normal Pelvic: adequate Extremities: Homans sign is negative, no sign of DVT DTR's wnl Presentation: breech Fetal monitoringBaseline: 120 bpm, Variability: Good {> 6 bpm), Accelerations: Reactive, and Decelerations: Absent Uterine activityNone    Bedside US performed prior to procedure - infant is vertex with body/spine on maternal right. - No need for ECV  Prenatal labs: ABO, Rh: O/Positive/-- (02/15 1454) Antibody: Positive, See Final Results (02/15 1454) Rubella: 7.79 (02/15 1454) RPR: Non Reactive (03/24 0851)  HBsAg: Negative (02/15 1454)  HIV: Non Reactive (03/24 0851)  GBS:   pending 2 hr Glucola wnl Genetic screening  low risk Anatomy US WNL  Prenatal Transfer Tool  Maternal Diabetes: No Genetic Screening: Normal Maternal Ultrasounds/Referrals: Normal Fetal Ultrasounds or other Referrals:   None Maternal Substance Abuse:  No Significant Maternal Medications:  None Significant Maternal Lab Results: Other: pending, collected 5/23  Results for orders placed or performed during the hospital encounter of 03/24/22 (from the past 24 hour(s))  CBC   Collection Time: 03/24/22  9:03 AM  Result Value Ref Range   WBC 6.8 4.0 - 10.5 K/uL   RBC 3.85 (L) 3.87 - 5.11 MIL/uL   Hemoglobin 12.0 12.0 - 15.0 g/dL   HCT 99.8 33.8 - 25.0 %   MCV 94.5 80.0 - 100.0 fL   MCH 31.2 26.0 - 34.0 pg   MCHC 33.0 30.0 - 36.0 g/dL   RDW 53.9 (H) 76.7 - 34.1 %   Platelets 212 150 - 400 K/uL   nRBC 0.0 0.0 - 0.2 %    Patient Active Problem List   Diagnosis Date Noted   Breech malpresentation successfully converted to cephalic presentation 03/24/2022   Left leg swelling 03/22/2022   History of pre-eclampsia 12/15/2021   Supervision of other normal pregnancy, antepartum 12/08/2021   Opioid use disorder, mild, abuse (HCC) 11/17/2021   Iron deficiency anemia 06/08/2021   Attention deficit hyperactivity disorder (ADHD), predominantly inattentive type 08/25/2020   Generalized anxiety disorder 05/12/2020   Bipolar I disorder, most recent episode mixed (HCC) 04/28/2020   MDD (major depressive disorder), recurrent severe, without psychosis (HCC) 04/21/2019    Assessment/Plan:  Kathy Bird is a 36 y.o. G4P1021 at [redacted]w[redacted]d here for ECV  #Breech-- Converted to vertex by bedside US today prior to ECV Patient discharged Await labor/anticipate vaginal birth.   Federico Flake, MD  03/24/2022, 10:13 AM

## 2022-03-25 NOTE — Discharge Summary (Signed)
Please see H&P. Patient admitted for procedure and was discharged same day.

## 2022-03-26 LAB — CULTURE, BETA STREP (GROUP B ONLY): Strep Gp B Culture: NEGATIVE

## 2022-03-28 ENCOUNTER — Encounter: Payer: Self-pay | Admitting: Radiology

## 2022-03-29 ENCOUNTER — Encounter: Payer: Self-pay | Admitting: Family Medicine

## 2022-03-29 ENCOUNTER — Ambulatory Visit (INDEPENDENT_AMBULATORY_CARE_PROVIDER_SITE_OTHER): Payer: Managed Care, Other (non HMO) | Admitting: Family Medicine

## 2022-03-29 VITALS — BP 115/76 | HR 91 | Wt 163.3 lb

## 2022-03-29 DIAGNOSIS — F1110 Opioid abuse, uncomplicated: Secondary | ICD-10-CM

## 2022-03-29 DIAGNOSIS — F3160 Bipolar disorder, current episode mixed, unspecified: Secondary | ICD-10-CM

## 2022-03-29 DIAGNOSIS — Z3480 Encounter for supervision of other normal pregnancy, unspecified trimester: Secondary | ICD-10-CM

## 2022-03-29 DIAGNOSIS — O321XX0 Maternal care for breech presentation, not applicable or unspecified: Secondary | ICD-10-CM

## 2022-03-29 DIAGNOSIS — Z8759 Personal history of other complications of pregnancy, childbirth and the puerperium: Secondary | ICD-10-CM

## 2022-03-29 NOTE — Progress Notes (Signed)
   Subjective:  Kathy Bird is a 36 y.o. G4P1021 at [redacted]w[redacted]d being seen today for ongoing prenatal care.  She is currently monitored for the following issues for this high-risk pregnancy and has MDD (major depressive disorder), recurrent severe, without psychosis ([place]); Bipolar I disorder, most recent episode mixed ([place]); Generalized anxiety disorder; Attention deficit hyperactivity disorder (ADHD), predominantly inattentive type; Iron deficiency anemia; Supervision of other normal pregnancy, antepartum; Opioid use disorder, mild, abuse ([place]); History of pre-eclampsia; Left leg swelling; and Breech malpresentation successfully converted to cephalic presentation on their problem list.  Patient reports no complaints.  Contractions: Irritability. Vag. Bleeding: None.  Movement: Present. Denies leaking of fluid.   The following portions of the patient's history were reviewed and updated as appropriate: allergies, current medications, past family history, past medical history, past social history, past surgical history and problem list. Problem list updated.  Objective:   Vitals:   03/29/22 0853  BP: 115/76  Pulse: 91  Weight: 163 lb 4.8 oz (74.1 kg)    Fetal Status: Fetal Heart Rate (bpm): 150   Movement: Present     General:  Alert, oriented and cooperative. Patient is in no acute distress.  Skin: Skin is warm and dry. No rash noted.   Cardiovascular: Normal heart rate noted  Respiratory: Normal respiratory effort, no problems with respiration noted  Abdomen: Soft, gravid, appropriate for gestational age. Pain/Pressure: Present     Pelvic: Vag. Bleeding: None     Cervical exam performed        Extremities: Normal range of motion.  Edema: Trace  Mental Status: Normal mood and affect. Normal behavior. Normal judgment and thought content.   Urinalysis:      Assessment and Plan:  Pregnancy: G4P1021 at [redacted]w[redacted]d  1. Supervision of other normal pregnancy, antepartum BP and FHR normal  2. Opioid  use disorder, mild, abuse ([place]) I refilled her rx for suboxone due to issues with regular prescribing clinic Advised to talk again with [facility] to get things worked out Have not been doing UDS routinely with her since I am not prescriber  3. History of pre-eclampsia On ASA, normotensive  4. Breech malpresentation successfully converted to cephalic presentation, single or unspecified fetus Spontaneously verted prior to scheduled ECV On SVE today still closed but vertex  5. Bipolar I disorder, most recent episode mixed ([place]) Using vistaril with good effect Started zoloft, not yet making a difference  Term labor symptoms and general obstetric precautions including but not limited to vaginal bleeding, contractions, leaking of fluid and fetal movement were reviewed in detail with the patient. Please refer to After Visit Summary for other counseling recommendations.  Return in 1 week (on 04/05/2022) for [facility], ob visit.   Clarnce Flock, MD

## 2022-03-29 NOTE — Patient Instructions (Signed)

## 2022-03-30 ENCOUNTER — Telehealth: Payer: Self-pay | Admitting: Pediatrics

## 2022-03-31 ENCOUNTER — Encounter: Payer: Self-pay | Admitting: Family Medicine

## 2022-04-05 ENCOUNTER — Encounter: Payer: Self-pay | Admitting: Family Medicine

## 2022-04-05 ENCOUNTER — Other Ambulatory Visit: Payer: Self-pay | Admitting: Family Medicine

## 2022-04-05 ENCOUNTER — Telehealth (HOSPITAL_COMMUNITY): Payer: Self-pay | Admitting: *Deleted

## 2022-04-05 ENCOUNTER — Ambulatory Visit (INDEPENDENT_AMBULATORY_CARE_PROVIDER_SITE_OTHER): Payer: Managed Care, Other (non HMO) | Admitting: Family Medicine

## 2022-04-05 ENCOUNTER — Encounter (HOSPITAL_COMMUNITY): Payer: Self-pay | Admitting: *Deleted

## 2022-04-05 VITALS — BP 115/76 | HR 93 | Wt 164.9 lb

## 2022-04-05 DIAGNOSIS — Z3480 Encounter for supervision of other normal pregnancy, unspecified trimester: Secondary | ICD-10-CM

## 2022-04-05 DIAGNOSIS — O321XX0 Maternal care for breech presentation, not applicable or unspecified: Secondary | ICD-10-CM

## 2022-04-05 DIAGNOSIS — F1110 Opioid abuse, uncomplicated: Secondary | ICD-10-CM

## 2022-04-05 DIAGNOSIS — Z8759 Personal history of other complications of pregnancy, childbirth and the puerperium: Secondary | ICD-10-CM

## 2022-04-05 DIAGNOSIS — F411 Generalized anxiety disorder: Secondary | ICD-10-CM

## 2022-04-05 MED ORDER — SUBOXONE 2-0.5 MG SL FILM: 1.0000 | ORAL_FILM | Freq: Two times a day (BID) | SUBLINGUAL | 0 refills | Status: DC

## 2022-04-05 NOTE — Patient Instructions (Signed)

## 2022-04-05 NOTE — Telephone Encounter (Signed)
Preadmission screen  

## 2022-04-05 NOTE — Progress Notes (Signed)
   Subjective:  Kathy Bird is a 36 y.o. G4P1021 at [redacted]w[redacted]d being seen today for ongoing prenatal care.  She is currently monitored for the following issues for this high-risk pregnancy and has MDD (major depressive disorder), recurrent severe, without psychosis ([place]); Bipolar I disorder, most recent episode mixed ([place]); Generalized anxiety disorder; Attention deficit hyperactivity disorder (ADHD), predominantly inattentive type; Iron deficiency anemia; Supervision of other normal pregnancy, antepartum; Opioid use disorder, mild, abuse ([place]); History of pre-eclampsia; Left leg swelling; and Breech malpresentation successfully converted to cephalic presentation on their problem list.  Patient reports no complaints.  Contractions: Irritability. Vag. Bleeding: None.  Movement: Present. Denies leaking of fluid.   The following portions of the patient's history were reviewed and updated as appropriate: allergies, current medications, past family history, past medical history, past social history, past surgical history and problem list. Problem list updated.  Objective:   Vitals:   04/05/22 0846  BP: 115/76  Pulse: 93  Weight: 164 lb 14.4 oz (74.8 kg)    Fetal Status: Fetal Heart Rate (bpm): 145   Movement: Present     General:  Alert, oriented and cooperative. Patient is in no acute distress.  Skin: Skin is warm and dry. No rash noted.   Cardiovascular: Normal heart rate noted  Respiratory: Normal respiratory effort, no problems with respiration noted  Abdomen: Soft, gravid, appropriate for gestational age. Pain/Pressure: Present     Pelvic: Vag. Bleeding: None     Cervical exam deferred        Extremities: Normal range of motion.     Mental Status: Normal mood and affect. Normal behavior. Normal judgment and thought content.   Urinalysis:      Assessment and Plan:  Pregnancy: G4P1021 at [redacted]w[redacted]d  1. Supervision of other normal pregnancy, antepartum BP and FHR normal Very over her  pregnancy, desires 39wk IOL Scheduled for 39 wks at midnight Form faxed, orders placed  2. Opioid use disorder, mild, abuse ([place]) Having significant difficulties with [name] Will assume prescribing as I do not want her to go into withdrawal UDS today Refill sent switched from Subutex to Suboxone  3. Generalized anxiety disorder Using vistaril with good effect Started on zoloft, last week no effect, feeling better this week, keep at 50 mg  4. Breech malpresentation successfully converted to cephalic presentation, single or unspecified fetus Vertex on exam today by [facility]  5. History of pre-eclampsia On ASA, normotensive  Term labor symptoms and general obstetric precautions including but not limited to vaginal bleeding, contractions, leaking of fluid and fetal movement were reviewed in detail with the patient. Please refer to After Visit Summary for other counseling recommendations.  Return in 1 week (on 04/12/2022) for ob visit, [place].   Clarnce Flock, MD

## 2022-04-06 ENCOUNTER — Other Ambulatory Visit: Payer: Self-pay | Admitting: Advanced Practice Midwife

## 2022-04-06 MED ORDER — SUBOXONE 2-0.5 MG SL FILM: 1.0000 | ORAL_FILM | Freq: Two times a day (BID) | SUBLINGUAL | 0 refills | Status: DC

## 2022-04-07 ENCOUNTER — Inpatient Hospital Stay (HOSPITAL_COMMUNITY): Payer: Managed Care, Other (non HMO) | Admitting: Anesthesiology

## 2022-04-07 ENCOUNTER — Inpatient Hospital Stay (HOSPITAL_COMMUNITY): Payer: Managed Care, Other (non HMO)

## 2022-04-07 ENCOUNTER — Encounter (HOSPITAL_COMMUNITY): Payer: Self-pay | Admitting: Family Medicine

## 2022-04-07 ENCOUNTER — Inpatient Hospital Stay (HOSPITAL_COMMUNITY)
Admission: AD | Admit: 2022-04-07 | Discharge: 2022-04-09 | DRG: 806 | Disposition: A | Discharge status: Home / Self Care | Payer: Managed Care, Other (non HMO) | Attending: Family Medicine | Admitting: Family Medicine

## 2022-04-07 ENCOUNTER — Other Ambulatory Visit: Payer: Self-pay

## 2022-04-07 DIAGNOSIS — Z3490 Encounter for supervision of normal pregnancy, unspecified, unspecified trimester: Secondary | ICD-10-CM | POA: Diagnosis present

## 2022-04-07 DIAGNOSIS — K5900 Constipation, unspecified: Secondary | ICD-10-CM | POA: Diagnosis present

## 2022-04-07 DIAGNOSIS — F332 Major depressive disorder, recurrent severe without psychotic features: Secondary | ICD-10-CM | POA: Diagnosis present

## 2022-04-07 DIAGNOSIS — Z3A39 39 weeks gestation of pregnancy: Secondary | ICD-10-CM

## 2022-04-07 DIAGNOSIS — D509 Iron deficiency anemia, unspecified: Secondary | ICD-10-CM | POA: Diagnosis present

## 2022-04-07 DIAGNOSIS — F1110 Opioid abuse, uncomplicated: Secondary | ICD-10-CM | POA: Diagnosis present

## 2022-04-07 DIAGNOSIS — Z3480 Encounter for supervision of other normal pregnancy, unspecified trimester: Secondary | ICD-10-CM

## 2022-04-07 DIAGNOSIS — O9902 Anemia complicating childbirth: Secondary | ICD-10-CM | POA: Diagnosis present

## 2022-04-07 DIAGNOSIS — O99344 Other mental disorders complicating childbirth: Secondary | ICD-10-CM | POA: Diagnosis present

## 2022-04-07 DIAGNOSIS — O99892 Other specified diseases and conditions complicating childbirth: Secondary | ICD-10-CM | POA: Diagnosis present

## 2022-04-07 DIAGNOSIS — F419 Anxiety disorder, unspecified: Secondary | ICD-10-CM | POA: Diagnosis present

## 2022-04-07 DIAGNOSIS — Z8759 Personal history of other complications of pregnancy, childbirth and the puerperium: Secondary | ICD-10-CM

## 2022-04-07 DIAGNOSIS — F1191 Opioid use, unspecified, in remission: Secondary | ICD-10-CM | POA: Diagnosis present

## 2022-04-07 DIAGNOSIS — F900 Attention-deficit hyperactivity disorder, predominantly inattentive type: Secondary | ICD-10-CM | POA: Diagnosis present

## 2022-04-07 DIAGNOSIS — F411 Generalized anxiety disorder: Secondary | ICD-10-CM | POA: Diagnosis present

## 2022-04-07 DIAGNOSIS — F3160 Bipolar disorder, current episode mixed, unspecified: Secondary | ICD-10-CM | POA: Diagnosis present

## 2022-04-07 DIAGNOSIS — Z87891 Personal history of nicotine dependence: Secondary | ICD-10-CM

## 2022-04-07 DIAGNOSIS — Z7982 Long term (current) use of aspirin: Secondary | ICD-10-CM | POA: Diagnosis not present

## 2022-04-07 DIAGNOSIS — O26893 Other specified pregnancy related conditions, third trimester: Secondary | ICD-10-CM | POA: Diagnosis present

## 2022-04-07 DIAGNOSIS — O99324 Drug use complicating childbirth: Secondary | ICD-10-CM | POA: Diagnosis present

## 2022-04-07 DIAGNOSIS — O321XX0 Maternal care for breech presentation, not applicable or unspecified: Secondary | ICD-10-CM | POA: Diagnosis present

## 2022-04-07 LAB — CBC
HCT: 39.3 % (ref 36.0–46.0)
Hemoglobin: 13 g/dL (ref 12.0–15.0)
MCH: 31.9 pg (ref 26.0–34.0)
MCHC: 33.1 g/dL (ref 30.0–36.0)
MCV: 96.6 fL (ref 80.0–100.0)
Platelets: 197 10*3/uL (ref 150–400)
RBC: 4.07 MIL/uL (ref 3.87–5.11)
RDW: 14.8 % (ref 11.5–15.5)
WBC: 6.5 10*3/uL (ref 4.0–10.5)
nRBC: 0 % (ref 0.0–0.2)

## 2022-04-07 LAB — RPR: RPR Ser Ql: NONREACTIVE

## 2022-04-07 LAB — TOXASSURE SELECT 13 (MW), URINE

## 2022-04-07 MED ORDER — LACTATED RINGERS IV SOLN: 500.0000 mL | Freq: Once | INTRAVENOUS | Status: DC

## 2022-04-07 MED ORDER — OXYTOCIN-SODIUM CHLORIDE 30-0.9 UT/500ML-% IV SOLN
2.5000 [IU]/h | INTRAVENOUS | Status: DC
  Administered 2022-04-07: 2.5 [IU]/h via INTRAVENOUS
  Filled 2022-04-07: qty 500

## 2022-04-07 MED ORDER — OXYTOCIN-SODIUM CHLORIDE 30-0.9 UT/500ML-% IV SOLN
1.0000 m[IU]/min | INTRAVENOUS | Status: DC
  Administered 2022-04-07: 1 m[IU]/min via INTRAVENOUS

## 2022-04-07 MED ORDER — LIDOCAINE HCL (PF) 1 % IJ SOLN
30.0000 mL | INTRAMUSCULAR | Status: AC | PRN
Start: 2022-04-07 — End: 2022-04-07
  Administered 2022-04-07: 30 mL via SUBCUTANEOUS
  Filled 2022-04-07: qty 30

## 2022-04-07 MED ORDER — LACTATED RINGERS IV SOLN
INTRAVENOUS | Status: DC
  Administered 2022-04-07: 1000 mL via INTRAVENOUS

## 2022-04-07 MED ORDER — TERBUTALINE SULFATE 1 MG/ML IJ SOLN
INTRAMUSCULAR | Status: AC
  Filled 2022-04-07: qty 1

## 2022-04-07 MED ORDER — PHENYLEPHRINE 80 MCG/ML (10ML) SYRINGE FOR IV PUSH (FOR BLOOD PRESSURE SUPPORT): 80.0000 ug | PREFILLED_SYRINGE | INTRAVENOUS | Status: DC | PRN

## 2022-04-07 MED ORDER — TERBUTALINE SULFATE 1 MG/ML IJ SOLN
0.2500 mg | Freq: Once | INTRAMUSCULAR | Status: AC
  Administered 2022-04-07: 0.25 mg via SUBCUTANEOUS

## 2022-04-07 MED ORDER — LACTATED RINGERS AMNIOINFUSION: INTRAVENOUS | Status: DC

## 2022-04-07 MED ORDER — BUPRENORPHINE HCL-NALOXONE HCL 2-0.5 MG SL SUBL
1.0000 | SUBLINGUAL_TABLET | Freq: Two times a day (BID) | SUBLINGUAL | Status: DC
  Administered 2022-04-07 – 2022-04-08 (×2): 1 via SUBLINGUAL
  Filled 2022-04-07 (×2): qty 1

## 2022-04-07 MED ORDER — FENTANYL-BUPIVACAINE-NACL 0.5-0.125-0.9 MG/250ML-% EP SOLN
12.0000 mL/h | EPIDURAL | Status: DC | PRN
  Administered 2022-04-07: 12 mL/h via EPIDURAL
  Filled 2022-04-07: qty 250

## 2022-04-07 MED ORDER — SOD CITRATE-CITRIC ACID 500-334 MG/5ML PO SOLN: 30.0000 mL | ORAL | Status: DC | PRN

## 2022-04-07 MED ORDER — EPHEDRINE 5 MG/ML INJ: 10.0000 mg | INTRAVENOUS | Status: DC | PRN

## 2022-04-07 MED ORDER — ACETAMINOPHEN 325 MG PO TABS
650.0000 mg | ORAL_TABLET | ORAL | Status: DC | PRN
  Administered 2022-04-07: 650 mg via ORAL
  Filled 2022-04-07: qty 2

## 2022-04-07 MED ORDER — LIDOCAINE-EPINEPHRINE (PF) 1.5 %-1:200000 IJ SOLN
INTRAMUSCULAR | Status: DC | PRN
  Administered 2022-04-07: 5 mL via EPIDURAL

## 2022-04-07 MED ORDER — OXYTOCIN BOLUS FROM INFUSION
333.0000 mL | Freq: Once | INTRAVENOUS | Status: AC
  Administered 2022-04-07: 333 mL via INTRAVENOUS

## 2022-04-07 MED ORDER — HYDROXYZINE HCL 25 MG PO TABS
25.0000 mg | ORAL_TABLET | Freq: Three times a day (TID) | ORAL | Status: DC | PRN
Start: 2022-04-07 — End: 2022-04-08

## 2022-04-07 MED ORDER — PHENYLEPHRINE 80 MCG/ML (10ML) SYRINGE FOR IV PUSH (FOR BLOOD PRESSURE SUPPORT)
80.0000 ug | PREFILLED_SYRINGE | INTRAVENOUS | Status: DC | PRN
  Filled 2022-04-07: qty 10

## 2022-04-07 MED ORDER — FENTANYL CITRATE (PF) 100 MCG/2ML IJ SOLN: 50.0000 ug | INTRAMUSCULAR | Status: DC | PRN

## 2022-04-07 MED ORDER — DIPHENHYDRAMINE HCL 50 MG/ML IJ SOLN: 12.5000 mg | INTRAMUSCULAR | Status: DC | PRN

## 2022-04-07 MED ORDER — LACTATED RINGERS IV SOLN
500.0000 mL | INTRAVENOUS | Status: DC | PRN
  Administered 2022-04-07 (×2): 500 mL via INTRAVENOUS

## 2022-04-07 MED ORDER — ONDANSETRON HCL 4 MG/2ML IJ SOLN
4.0000 mg | Freq: Four times a day (QID) | INTRAMUSCULAR | Status: DC | PRN
  Administered 2022-04-07: 4 mg via INTRAVENOUS
  Filled 2022-04-07: qty 2

## 2022-04-07 MED ORDER — TERBUTALINE SULFATE 1 MG/ML IJ SOLN: 0.2500 mg | Freq: Once | INTRAMUSCULAR | Status: DC | PRN

## 2022-04-07 MED ORDER — MISOPROSTOL 50MCG HALF TABLET
50.0000 ug | ORAL_TABLET | Freq: Once | ORAL | Status: AC
Start: 2022-04-07 — End: 2022-04-07
  Administered 2022-04-07: 50 ug via BUCCAL
  Filled 2022-04-07: qty 1

## 2022-04-07 MED ORDER — SERTRALINE HCL 50 MG PO TABS
50.0000 mg | ORAL_TABLET | Freq: Every day | ORAL | Status: DC
  Administered 2022-04-08: 50 mg via ORAL
  Filled 2022-04-07: qty 1

## 2022-04-07 NOTE — H&P (Signed)
OBSTETRIC ADMISSION HISTORY AND PHYSICAL  Kathy Bird is a 36 y.o. female [phone] with IUP at [redacted]w[redacted]d by presenting for scheduled IOL (elective at term). She reports +FMs, No LOF, no VB, no blurry vision, headaches or peripheral edema, and RUQ pain.  She plans on breast and bottle feeding. She request outpatient IUD for birth control. She received her prenatal care at  [facility]    Dating: By 19 wk [place] --->  Estimated Date of Delivery: 04/14/22  Sono:    @[redacted]w[redacted]d , CWD, normal anatomy, cephalic presentation, posterior placenta, 2660 g, 79% EFW   Prenatal History/Complications:  - OUD, stable on suboxone - anxiety - unstable lie - hx of PreE in prior pregnancy  Past Medical History: Past Medical History:  Diagnosis Date   Anxiety    Bipolar disorder (HCC)    Depression    Pregnancy induced hypertension    Schizophrenia (HCC)    Seizures (HCC)    as child. last seizure when 78yrs old    Past Surgical History: Past Surgical History:  Procedure Laterality Date   ADENOIDECTOMY      Obstetrical History: OB History     Gravida  4   Para  1   Term  1   Preterm  0   AB  2   Living  1      SAB  0   IAB  2   Ectopic  0   Multiple  0   Live Births  1        Obstetric Comments  Pre-eclampsia  with delivery         Social History Social History   Socioeconomic History   Marital status: Divorced    Spouse name: Not on file   Number of children: Not on file   Years of education: Not on file   Highest education level: Not on file  Occupational History   Not on file  Tobacco Use   Smoking status: Former    Years: 9.00    Types: Cigarettes    Quit date: 11/18/2021    Years since quitting: 0.3   Smokeless tobacco: Never  Vaping Use   Vaping Use: Never used  Substance and Sexual Activity   Alcohol use: Not Currently    Alcohol/week: 3.0 standard drinks of alcohol    Types: 3 Cans of beer per week    Comment: Social   Drug use: Not Currently    Types:  Other-see comments    Comment: No Rx drugs since 11/18/2021   Sexual activity: Yes    Birth control/protection: None  Other Topics Concern   Not on file  Social History Narrative   Not on file   Social Determinants of Health   Financial Resource Strain: Not on file  Food Insecurity: No Food Insecurity (04/05/2022)   Hunger Vital Sign    Worried About Running Out of Food in the Last Year: Never true    Ran Out of Food in the Last Year: Never true  Transportation Needs: No Transportation Needs (04/05/2022)   PRAPARE - 06/05/2022 (Medical): No    Lack of Transportation (Non-Medical): No  Physical Activity: Not on file  Stress: Not on file  Social Connections: Not on file    Family History: Family History  Problem Relation Age of Onset   Cancer Mother    Healthy Mother     Allergies: Allergies  Allergen Reactions   Peanut-Containing Drug Products Hives  Shellfish Allergy Hives, Itching and Swelling    Tongue swelling also    Medications Prior to Admission  Medication Sig Dispense Refill Last Dose   acetaminophen (TYLENOL) 500 MG tablet Take 1,000 mg by mouth every 6 (six) hours as needed.      aspirin 81 MG chewable tablet Chew 1 tablet (81 mg total) by mouth daily. 30 tablet 3    Blood Pressure Monitoring DEVI 1 each by Does not apply route once a week. 1 each 0    FeFum-FePoly-FA-B Cmp-C-Biot (INTEGRA PLUS) CAPS Take 1 capsule by mouth daily. 30 capsule 3    hydrOXYzine (VISTARIL) 25 MG capsule Take 1 capsule (25 mg total) by mouth 3 (three) times daily as needed for anxiety. 30 capsule 2    melatonin 1 MG TABS tablet Take 1 tablet (1 mg total) by mouth at bedtime. 30 tablet 6    naloxone (NARCAN) nasal spray 4 mg/0.1 mL Use as needed to reverse opioid overdose 2 each 1    ondansetron (ZOFRAN-ODT) 4 MG disintegrating tablet Take 1 tablet (4 mg total) by mouth every 6 (six) hours as needed for nausea or vomiting. 20 tablet 2    sertraline  (ZOLOFT) 50 MG tablet Take 1 tablet (50 mg total) by mouth daily. 30 tablet 2    SUBOXONE 2-0.5 MG FILM Place 1 Film under the tongue 2 (two) times daily for 14 days. 28 each 0      Review of Systems   All systems reviewed and negative except as stated in HPI  Height 5\' 4"  (1.626 m), weight 76.3 kg, last menstrual period 04/04/2021. General appearance: alert, cooperative, and appears stated age Lungs: clear to auscultation bilaterally Heart: regular rate and rhythm Abdomen: soft, non-tender; bowel sounds normal Extremities: Homans sign is negative, no sign of DVT  Dilation: 5 Effacement (%): 50 Station: -2 Presentation: Vertex Exam by:: H Otten RN  Presentation: cephalic Fetal monitoringBaseline: 135 bpm, Variability: Good {> 6 bpm), Accelerations: Reactive, and Decelerations: Absent Uterine activity: irregular, rare     Prenatal labs: ABO, Rh: --/--/PENDING (06/08 [id number]) Antibody: PENDING (06/08 [id number]) Rubella: 7.79 (02/15 1454) RPR: Non Reactive (03/24 0851)  HBsAg: Negative (02/15 1454)  HIV: Non Reactive (03/24 0851)  GBS: Negative/-- (05/23 1014)  2hr GTT: normal Genetic screening  low risk Anatomy US : normal  Prenatal Transfer Tool  Maternal Diabetes: No Genetic Screening: Normal Maternal Ultrasounds/Referrals: Normal Fetal Ultrasounds or other Referrals:  None Maternal Substance Abuse:  No, but stable on Suboxone Significant Maternal Medications:  Meds include: Other: Suboxone Significant Maternal Lab Results: Group B Strep negative  Results for orders placed or performed during the hospital encounter of 04/07/22 (from the past 24 hour(s))  Type and screen   Collection Time: 04/07/22  8:19 AM  Result Value Ref Range   ABO/RH(D) PENDING    Antibody Screen PENDING    Sample Expiration      04/10/2022,2359 Performed at [facility], [address] [place], [place] [address]     Patient Active Problem List   Diagnosis Date Noted   Encounter for  elective induction of labor 04/07/2022   Breech malpresentation successfully converted to cephalic presentation 03/24/2022   Left leg swelling 03/22/2022   History of pre-eclampsia 12/15/2021   Supervision of other normal pregnancy, antepartum 12/08/2021   Opioid use disorder, mild, abuse (HCC) 11/17/2021   Iron deficiency anemia 06/08/2021   Attention deficit hyperactivity disorder (ADHD), predominantly inattentive type 08/25/2020   Generalized anxiety disorder  05/12/2020   Bipolar I disorder, most recent episode mixed (HCC) 04/28/2020   MDD (major depressive disorder), recurrent severe, without psychosis (HCC) 04/21/2019    Assessment/Plan:  Kathy Bird is a 36 y.o. [id number] at [redacted]w[redacted]d here for IOL (elective at term)  #Labor:patient 2 cm with bulging bag. Confirmed Vertex with BSUS at admission given she felt large movements after ECV. Patient SROMed during exam and foley balloon placed by Dr. Ephriam Jenkins #Pain: Plans epidural #FWB: Cat I #ID:  GBS neg #MOF: both #MOC:outpatient IUD #Circ:  N/a  Venora Maples, MD  04/07/2022, 9:20 AM

## 2022-04-07 NOTE — Progress Notes (Signed)
LABOR PROGRESS NOTE  Kathy Bird is a 36 y.o. [id number] at [redacted]w[redacted]d  admitted for elective IOL.  Subjective: Comfortable with epidural  Objective: BP 120/67   Pulse 94   Temp 97.9 F (36.6 C) (Oral)   Ht 5\' 4"  (1.626 m)   Wt 76.3 kg   LMP 04/04/2021 (Exact Date)   SpO2 99%   BMI 28.87 kg/m  or  Vitals:   04/07/22 1510 04/07/22 1515 04/07/22 1520 04/07/22 1530  BP: 119/65 119/67 121/71 120/67  Pulse: (!) 108 (!) 110 (!) 101 94  Temp:    97.9 F (36.6 C)  TempSrc:    Oral  SpO2: 99%     Weight:      Height:         Dilation: 4 Effacement (%): 50 Station: -2 Presentation: Vertex Exam by:: Lamount Cohen, MD FHT: baseline rate 150, moderate varibility, +acel, +variable decel Toco: contractions regular q2-3 min  Labs: Lab Results  Component Value Date   WBC 6.5 04/07/2022   HGB 13.0 04/07/2022   HCT 39.3 04/07/2022   MCV 96.6 04/07/2022   PLT 197 04/07/2022    Patient Active Problem List   Diagnosis Date Noted   Encounter for elective induction of labor 04/07/2022   Breech malpresentation successfully converted to cephalic presentation [id number]   Left leg swelling 03/22/2022   History of pre-eclampsia 12/15/2021   Supervision of other normal pregnancy, antepartum 12/08/2021   Opioid use disorder, mild, abuse ([place]) 11/17/2021   Iron deficiency anemia 06/08/2021   Attention deficit hyperactivity disorder (ADHD), predominantly inattentive type 08/25/2020   Generalized anxiety disorder 05/12/2020   Bipolar I disorder, most recent episode mixed ([place]) 04/28/2020   MDD (major depressive disorder), recurrent severe, without psychosis ([place]) 04/21/2019    Assessment / Plan: 36 y.o. [id number] at [redacted]w[redacted]d here for elective IOL at term.  Labor: s/p FB, progressed well from 2 on admission to 4 cm currently. Received misoprostol at [id number], around 1340 had tachysystole and received terbutaline shortly after. Currently having recurrent variables though still reassuring strip with  moderate variability and accels. IUPC placed and amnioinfusion to be started. Plan for pitocin once miso washes out.  Fetal Wellbeing:  Cat II at present but overall reassuring with moderate variability and accels Pain Control:  epidural GBS: negative Anticipated MOD:  NSVD  OUD: cont suboxone 2mg  BID  Anxiety: cont sertraline 50, hydroxyzine PRN  Clarnce Flock, MD/MPH Attending Family Medicine Physician, [facility]   04/07/2022, 4:33 PM

## 2022-04-07 NOTE — Anesthesia Preprocedure Evaluation (Addendum)
Anesthesia Evaluation  Patient identified by MRN, date of birth, ID band Patient awake    Reviewed: Allergy & Precautions, NPO status , Patient's Chart, lab work & pertinent test results  Airway Mallampati: II  TM Distance: >3 FB Neck ROM: Full    Dental no notable dental hx.    Pulmonary former smoker,    Pulmonary exam normal        Cardiovascular hypertension,  Rhythm:Regular Rate:Normal     Neuro/Psych Seizures - (last at age 36), Well Controlled,  Anxiety Depression Bipolar Disorder Schizophrenia    GI/Hepatic negative GI ROS, (+)     substance abuse (on Suboxone, last use 01/23)  ,   Endo/Other  negative endocrine ROS  Renal/GU negative Renal ROS  negative genitourinary   Musculoskeletal negative musculoskeletal ROS (+) narcotic dependent  Abdominal Normal abdominal exam  (+)   Peds  Hematology  (+) Blood dyscrasia, anemia ,   Anesthesia Other Findings   Reproductive/Obstetrics                             Anesthesia Physical Anesthesia Plan  ASA: 2  Anesthesia Plan: Epidural   Post-op Pain Management:    Induction:   PONV Risk Score and Plan: 2 and Treatment may vary due to age or medical condition  Airway Management Planned: Natural Airway  Additional Equipment: None  Intra-op Plan:   Post-operative Plan:   Informed Consent: I have reviewed the patients History and Physical, chart, labs and discussed the procedure including the risks, benefits and alternatives for the proposed anesthesia with the patient or authorized representative who has indicated his/her understanding and acceptance.     Dental advisory given  Plan Discussed with: CRNA  Anesthesia Plan Comments: (Lab Results      Component                Value               Date                      WBC                      6.5                 04/07/2022                HGB                      13.0                 04/07/2022                HCT                      39.3                04/07/2022                MCV                      96.6                04/07/2022                PLT  197                 04/07/2022          )        Anesthesia Quick Evaluation

## 2022-04-07 NOTE — Anesthesia Procedure Notes (Signed)
Epidural Patient location during procedure: OB Start time: 04/07/2022 2:35 PM End time: 04/07/2022 2:43 PM  Staffing Anesthesiologist: Atilano Median, DO Performed: anesthesiologist   Preanesthetic Checklist Completed: patient identified, IV checked, site marked, risks and benefits discussed, surgical consent, monitors and equipment checked, pre-op evaluation and timeout performed  Epidural Patient position: sitting Prep: ChloraPrep Patient monitoring: heart rate, continuous pulse ox and blood pressure Approach: midline Location: L3-L4 Injection technique: LOR saline  Needle:  Needle type: Tuohy  Needle gauge: 17 G Needle length: 9 cm Needle insertion depth: 6 cm Catheter type: closed end flexible Catheter size: 20 Guage Catheter at skin depth: 10 cm Test dose: negative and 1.5% lidocaine  Assessment Events: blood not aspirated, injection not painful, no injection resistance and no paresthesia  Additional Notes Patient identified. Risks/Benefits/Options discussed with patient including but not limited to bleeding, infection, nerve damage, paralysis, failed block, incomplete pain control, headache, blood pressure changes, nausea, vomiting, reactions to medications, itching and postpartum back pain. Confirmed with bedside nurse the patient's most recent platelet count. Confirmed with patient that they are not currently taking any anticoagulation, have any bleeding history or any family history of bleeding disorders. Patient expressed understanding and wished to proceed. All questions were answered. Sterile technique was used throughout the entire procedure. Please see nursing notes for vital signs. Test dose was given through epidural catheter and negative prior to continuing to dose epidural or start infusion. Warning signs of high block given to the patient including shortness of breath, tingling/numbness in hands, complete motor block, or any concerning symptoms with instructions  to call for help. Patient was given instructions on fall risk and not to get out of bed. All questions and concerns addressed with instructions to call with any issues or inadequate analgesia.    Reason for block:procedure for pain

## 2022-04-07 NOTE — Progress Notes (Signed)
LABOR PROGRESS NOTE  Kathy Bird is a 36 y.o. [id number] at [redacted]w[redacted]d  admitted for elective IOL.  Subjective: Starting to feel contractions more strongly Planning to get epidural soon  Objective: BP (!) 115/59   Pulse 76   Temp 97.6 F (36.4 C) (Oral)   Ht 5\' 4"  (1.626 m)   Wt 76.3 kg   LMP 04/04/2021 (Exact Date)   BMI 28.87 kg/m  or  Vitals:   04/07/22 0944 04/07/22 1112 04/07/22 1234 04/07/22 1324  BP: 122/79 124/76 119/72 (!) 115/59  Pulse: 77 77 86 76  Temp:  (!) 97.4 F (36.3 C) 97.6 F (36.4 C)   TempSrc:  Oral Oral   Weight:      Height:         Dilation: 5 Effacement (%): 50 Station: -2 Presentation: Vertex Exam by:: H Otten RN FHT: baseline rate 150, moderate varibility, +acel, +decel Toco: quiet  Labs: Lab Results  Component Value Date   WBC 6.5 04/07/2022   HGB 13.0 04/07/2022   HCT 39.3 04/07/2022   MCV 96.6 04/07/2022   PLT 197 04/07/2022    Patient Active Problem List   Diagnosis Date Noted   Encounter for elective induction of labor 04/07/2022   Breech malpresentation successfully converted to cephalic presentation [id number]   Left leg swelling 03/22/2022   History of pre-eclampsia 12/15/2021   Supervision of other normal pregnancy, antepartum 12/08/2021   Opioid use disorder, mild, abuse ([place]) 11/17/2021   Iron deficiency anemia 06/08/2021   Attention deficit hyperactivity disorder (ADHD), predominantly inattentive type 08/25/2020   Generalized anxiety disorder 05/12/2020   Bipolar I disorder, most recent episode mixed ([place]) 04/28/2020   MDD (major depressive disorder), recurrent severe, without psychosis ([place]) 04/21/2019    Assessment / Plan: 36 y.o. [id number] at [redacted]w[redacted]d here for elective IOL at term.  Labor: s/p FB, progressed well from 2 on admission to 5 cm currently. Received misoprostol at [id number], around 1340 had tachysystole and received terbutaline shortly after. Plan for pitocin PRN once miso washes out.  Fetal Wellbeing:  Cat I at  present Pain Control:  planning on epidural soon GBS: negative Anticipated MOD:  NSVD  OUD: cont suboxone 2mg  BID  Anxiety: cont sertraline 50, hydroxyzine PRN  Clarnce Flock, MD/MPH Attending Family Medicine Physician, [facility] for [facility], [place] [facility]   04/07/2022, 2:08 PM

## 2022-04-07 NOTE — Discharge Summary (Signed)
Postpartum Discharge Summary  Date of Service updated***     Patient Name: Kathy Bird DOB: [birth year] MRN: [id number]  Date of admission: 04/07/2022 Delivery date:04/07/2022  Delivering provider: Clarnce Flock  Date of discharge: 04/07/2022  Admitting diagnosis: Encounter for elective induction of labor [Z34.90] Intrauterine pregnancy: [redacted]w[redacted]d    Secondary diagnosis:  Principal Problem:   Encounter for elective induction of labor Active Problems:   Iron deficiency anemia   Supervision of other normal pregnancy, antepartum   History of pre-eclampsia   Breech malpresentation successfully converted to cephalic presentation  Additional problems: ***    Discharge diagnosis: Term Pregnancy Delivered                                              Post partum procedures:{Postpartum procedures:23558} Augmentation: AROM, Pitocin, Cytotec, and IP Foley Complications: {OB Labor/Delivery Complications:20784}  Hospital course: Induction of Labor With Vaginal Delivery   36y.o. yo [id number] [id number] admitted to the hospital 04/07/2022 for induction of labor.  Indication for induction: Elective.  Patient had an uncomplicated labor course as follows: Membrane Rupture Time/Date: 9:40 AM ,04/07/2022   Delivery Method:  Episiotomy: None  Lacerations:  2nd degree  Details of delivery can be found in separate delivery note.  Patient had a routine postpartum course. Patient is discharged home 04/07/22.  Newborn Data: Birth date:04/07/2022  Birth time:9:19 PM  Gender:Female  Living status:  Apgars: ,  Weight:   Magnesium Sulfate received: No BMZ received: No Rhophylac:N/A MMR:N/A T-DaP: declined Flu: Yes Transfusion:{Transfusion received:30440034}  Physical exam  Vitals:   04/07/22 1930 04/07/22 2000 04/07/22 2030 04/07/22 2130  BP: 109/63 112/64 105/60 (!) 108/52  Pulse: 96 (!) 104 (!) 109 (!) 216  Resp:      Temp:      TempSrc:      SpO2:      Weight:      Height:       General:  {Exam; general:21111117} Lochia: {Desc; appropriate/inappropriate:30686::"appropriate"} Uterine Fundus: {Desc; firm/soft:30687} Incision: {Exam; incision:21111123} DVT Evaluation: {Exam; dvt:2111122} Labs: Lab Results  Component Value Date   WBC 6.5 04/07/2022   HGB 13.0 04/07/2022   HCT 39.3 04/07/2022   MCV 96.6 04/07/2022   PLT 197 04/07/2022      Latest Ref Rng & Units 12/20/2021   11:50 AM  CMP  Glucose 70 - 99 mg/dL 83   BUN 6 - 20 mg/dL 10   Creatinine 0.44 - 1.00 mg/dL 0.58   Sodium 135 - 145 mmol/L 137   Potassium 3.5 - 5.1 mmol/L 3.7   Chloride 98 - 111 mmol/L 107   CO2 22 - 32 mmol/L 24   Calcium 8.9 - 10.3 mg/dL 8.9   Total Protein 6.5 - 8.1 g/dL 7.4   Total Bilirubin 0.3 - 1.2 mg/dL 0.3   Alkaline Phos 38 - 126 U/L 53   AST 15 - 41 U/L 12   ALT 0 - 44 U/L 13    Edinburgh Score:     No data to display           After visit meds:  Allergies as of 04/07/2022       Reactions   Peanut-containing Drug Products Hives   Shellfish Allergy Hives, Itching, Swelling   Tongue swelling also     Med Rec must be completed prior to using this [facility]*  Discharge home in stable condition Infant Feeding: Bottle and Breast Infant Disposition:home with mother Discharge instruction: per After Visit Summary and Postpartum booklet. Activity: Advance as tolerated. Pelvic rest for 6 weeks.  Diet: routine diet Future Appointments: Future Appointments  Date Time Provider Department Center  04/12/2022  8:15 AM Clarnce Flock, MD [facility]  05/23/2022  9:00 AM [facility] None  05/23/2022  9:30 AM Curt Bears, MD [facility] None   Follow up Visit:   Please schedule this patient for a In person postpartum visit in 6 weeks with the following provider: MD. Additional Postpartum F/U: none   High risk pregnancy complicated by:  OUD on suboxone Delivery mode:    Anticipated Birth Control:   IUD, outpatient   04/07/2022 Clarnce Flock, MD

## 2022-04-08 ENCOUNTER — Encounter (HOSPITAL_COMMUNITY): Payer: Self-pay | Admitting: Family Medicine

## 2022-04-08 MED ORDER — COCONUT OIL OIL: 1.0000 "application " | TOPICAL_OIL | Status: DC | PRN

## 2022-04-08 MED ORDER — BENZOCAINE-MENTHOL 20-0.5 % EX AERO: 1.0000 "application " | INHALATION_SPRAY | CUTANEOUS | Status: DC | PRN

## 2022-04-08 MED ORDER — ACETAMINOPHEN 325 MG PO TABS
650.0000 mg | ORAL_TABLET | ORAL | Status: DC | PRN
  Administered 2022-04-08 (×2): 650 mg via ORAL
  Filled 2022-04-08 (×2): qty 2

## 2022-04-08 MED ORDER — WITCH HAZEL-GLYCERIN EX PADS: 1.0000 "application " | MEDICATED_PAD | CUTANEOUS | Status: DC | PRN

## 2022-04-08 MED ORDER — IBUPROFEN 600 MG PO TABS
600.0000 mg | ORAL_TABLET | Freq: Four times a day (QID) | ORAL | Status: DC
  Administered 2022-04-08 – 2022-04-09 (×6): 600 mg via ORAL
  Filled 2022-04-08 (×6): qty 1

## 2022-04-08 MED ORDER — DIBUCAINE (PERIANAL) 1 % EX OINT: 1.0000 "application " | TOPICAL_OINTMENT | CUTANEOUS | Status: DC | PRN

## 2022-04-08 MED ORDER — ACETAMINOPHEN 500 MG PO TABS
1000.0000 mg | ORAL_TABLET | Freq: Four times a day (QID) | ORAL | Status: DC
  Administered 2022-04-08 – 2022-04-09 (×5): 1000 mg via ORAL
  Filled 2022-04-08 (×6): qty 2

## 2022-04-08 MED ORDER — ZOLPIDEM TARTRATE 5 MG PO TABS: 5.0000 mg | ORAL_TABLET | Freq: Every evening | ORAL | Status: DC | PRN

## 2022-04-08 MED ORDER — BISACODYL 10 MG RE SUPP
10.0000 mg | Freq: Once | RECTAL | Status: DC
  Filled 2022-04-08: qty 1

## 2022-04-08 MED ORDER — SENNOSIDES-DOCUSATE SODIUM 8.6-50 MG PO TABS
2.0000 | ORAL_TABLET | ORAL | Status: DC
  Administered 2022-04-08: 2 via ORAL
  Filled 2022-04-08 (×2): qty 2

## 2022-04-08 MED ORDER — ONDANSETRON HCL 4 MG PO TABS: 4.0000 mg | ORAL_TABLET | ORAL | Status: DC | PRN

## 2022-04-08 MED ORDER — BUPRENORPHINE HCL-NALOXONE HCL 2-0.5 MG SL SUBL
1.0000 | SUBLINGUAL_TABLET | Freq: Two times a day (BID) | SUBLINGUAL | Status: DC
  Administered 2022-04-08 – 2022-04-09 (×2): 1 via SUBLINGUAL
  Filled 2022-04-08 (×2): qty 1

## 2022-04-08 MED ORDER — SIMETHICONE 80 MG PO CHEW: 80.0000 mg | CHEWABLE_TABLET | ORAL | Status: DC | PRN

## 2022-04-08 MED ORDER — TETANUS-DIPHTH-ACELL PERTUSSIS 5-2.5-18.5 LF-MCG/0.5 IM SUSY: 0.5000 mL | PREFILLED_SYRINGE | Freq: Once | INTRAMUSCULAR | Status: DC

## 2022-04-08 MED ORDER — PRENATAL MULTIVITAMIN CH
1.0000 | ORAL_TABLET | Freq: Every day | ORAL | Status: DC
  Administered 2022-04-08 – 2022-04-09 (×2): 1 via ORAL
  Filled 2022-04-08 (×2): qty 1

## 2022-04-08 MED ORDER — DIPHENHYDRAMINE HCL 25 MG PO CAPS: 25.0000 mg | ORAL_CAPSULE | Freq: Four times a day (QID) | ORAL | Status: DC | PRN

## 2022-04-08 MED ORDER — ONDANSETRON HCL 4 MG/2ML IJ SOLN: 4.0000 mg | INTRAMUSCULAR | Status: DC | PRN
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
## 2022-04-08 NOTE — Lactation Note (Signed)
This note was copied from a baby's chart. Lactation Consultation Note  Patient Name: Kathy Bird WGYKZ'L Date: 04/08/2022   Age:34 hours  Mom declined [facility] services stating she is feeding baby formula only.   [name]  RN IBCLC 04/08/2022, 8:51 AM

## 2022-04-08 NOTE — Anesthesia Postprocedure Evaluation (Signed)
Anesthesia Post Note  Patient: Kathy Bird  Procedure(s) Performed: AN AD HOC LABOR EPIDURAL     Patient location during evaluation: Mother Baby Anesthesia Type: Epidural Level of consciousness: awake Pain management: satisfactory to patient Vital Signs Assessment: post-procedure vital signs reviewed and stable Respiratory status: spontaneous breathing Cardiovascular status: stable Anesthetic complications: no   No notable events documented.  Last Vitals:  Vitals:   04/08/22 0445 04/08/22 0816  BP: 105/77 108/77  Pulse: 99 81  Resp: 16 16  Temp:  36.4 C  SpO2:  97%    Last Pain:  Vitals:   04/08/22 0816  TempSrc: Oral  PainSc:    Pain Goal: Patients Stated Pain Goal: 3 (04/08/22 0815)                 Cephus Shelling

## 2022-04-08 NOTE — Progress Notes (Addendum)
POSTPARTUM PROGRESS NOTE  Post Partum Day 1  Subjective:  Kathy Bird is a 36 y.o. [id number] s/p SVD at [redacted]w[redacted]d No acute events overnight. She is experiencing some cramping this morning and states her pain is poorly controlled at this time. Patient denies problems with ambulating, voiding or PO intake. She experienced some nausea earlier this morning but this has improved. She has not had flatus. She has not had bowel movement but feels like she needs to. Bleeding is slightly heavier than a period. No other concerns at this time.  Objective: Blood pressure 105/77, pulse 99, temperature 99.3 F (37.4 C), temperature source Oral, resp. rate 16, height _0  (1.626 m), weight 76.3 kg, last menstrual period 04/04/2021, SpO2 100 %, unknown if currently breastfeeding.  Physical Exam:  General: alert, cooperative and no distress Resp: normal work of breathing on room air Heart: normal rate, warm and well perfused Abdomen: soft, nontender Uterine Fundus: firm and above umbilicus DVT Evaluation: No calf swelling or tenderness Extremities: No LE edema or calf tenderness to palpation Skin: warm, dry  Recent Labs    04/07/22 0819  HGB 13.0  HCT 39.3    Assessment/Plan: Kathy STRAWDERMANis a 36y.o. [id number]/p SVD at 356w0d  PPD#1: Doing okay, pain not well controlled. VSS. Continue routine PP care. -Patient with hx of opioid use disorder - avoid opioids for pain control, will trial K-pad and Tylenol -Bisacodyl suppository for constipation  Contraception: IUD outpatient  Feeding: bottle, going well per pt  Dispo: Plan for discharge on PPD2.   LOS: 1 day   BeLavone NeriStudent-PA 04/08/2022, 7:48 AM    I personally saw and evaluated the patient, performing the key elements of the service. I developed and verified the management plan that is described in the resident's/student's note, and I agree with the content with my edits above. VSS, HRR&R, Resp unlabored, Legs neg.  FrNigel BertholdCNM 04/08/2022 8:27 AM

## 2022-04-08 NOTE — Social Work (Signed)
CSW delivered a pack n play and car seat to MOB bedside. MOB complete the necessary documents.   Aspin Palomarez, MSW, LCSW [facility]  Clinical Social Worker  [phone] 04/08/2022  6:51 PM  

## 2022-04-09 MED ORDER — IBUPROFEN 600 MG PO TABS
600.0000 mg | ORAL_TABLET | Freq: Four times a day (QID) | ORAL | 0 refills | Status: AC
Start: 2022-04-09 — End: 2022-04-24

## 2022-04-09 MED ORDER — SENNOSIDES-DOCUSATE SODIUM 8.6-50 MG PO TABS: 2.0000 | ORAL_TABLET | ORAL | 0 refills | Status: AC

## 2022-04-09 MED ORDER — ACETAMINOPHEN 500 MG PO TABS: 1000.0000 mg | ORAL_TABLET | Freq: Four times a day (QID) | ORAL | 0 refills | Status: AC
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
## 2022-04-09 NOTE — Progress Notes (Deleted)
CSW acknowledged consult for edinburgh score 13. CSW screening out consult due to MOB being seen on 04/08/2022 by CSW Nelida Meuse) and a full psychosocial assessment being completed at that time. CSW provided education regarding the baby blues period vs. perinatal mood disorders, discussed treatment and gave resources for mental health follow up if concerns arise.  CSW recommends self-evaluation during the postpartum time period using the New Mom Checklist from Postpartum Progress and encouraged MOB to contact a medical professional if symptoms are noted at any time.    CSW identifies no further need for intervention and no barriers to discharge at this time.  Celso Sickle, LCSW Clinical Social Worker [facility] Cell#: [phone]

## 2022-04-11 LAB — TYPE AND SCREEN
ABO/RH(D): O POS
Antibody Screen: POSITIVE
Unit division: 0
Unit division: 0

## 2022-04-11 LAB — BPAM RBC
Blood Product Expiration Date: 202306262359
Blood Product Expiration Date: 202306262359
Unit Type and Rh: 5100
Unit Type and Rh: 5100

## 2022-04-12 ENCOUNTER — Telehealth: Payer: Self-pay | Admitting: Internal Medicine

## 2022-04-12 ENCOUNTER — Encounter: Payer: Self-pay | Admitting: Family Medicine

## 2022-04-12 NOTE — Telephone Encounter (Signed)
Called patient regarding upcoming scheduled July appointments, left a voicemail.

## 2022-04-13 ENCOUNTER — Other Ambulatory Visit: Payer: Self-pay | Admitting: Internal Medicine

## 2022-04-13 DIAGNOSIS — Z3480 Encounter for supervision of other normal pregnancy, unspecified trimester: Secondary | ICD-10-CM

## 2022-04-16 ENCOUNTER — Telehealth (HOSPITAL_COMMUNITY): Payer: Self-pay

## 2022-04-16 NOTE — Telephone Encounter (Signed)
"  I'm doing really well. I'm feeding her now. I'm feeling really good. My lower back hurts sometimes. Is it normal to have cramping?" RN reviewed that sometimes some soreness at the epidural site can take a few weeks to resolve. RN suggested trying a heating pad to lower back for comfort. RN also explained uterine involution and cramping. Patient has no other questions or concerns about her healing.  "She's doing well. Sometimes it's hard to get a burp out. She is eating really good. She is bottle feeding." RN reviewed paced bottle feeding and burping techniques. "She sleeps in a crib next to my bed." RN reviewed ABC's of safe sleep with patient. Patient declines any questions or concerns about baby.  EPDS score is 1.  Marcelino Duster [facility] 06/17//2023,1315

## 2022-04-19 ENCOUNTER — Encounter: Payer: Self-pay | Admitting: Family Medicine

## 2022-04-20 ENCOUNTER — Other Ambulatory Visit: Payer: Self-pay | Admitting: Family Medicine

## 2022-04-20 DIAGNOSIS — F1110 Opioid abuse, uncomplicated: Secondary | ICD-10-CM

## 2022-04-20 MED ORDER — SUBOXONE 2-0.5 MG SL FILM: 1.0000 | ORAL_FILM | Freq: Two times a day (BID) | SUBLINGUAL | 0 refills | Status: DC

## 2022-04-20 NOTE — Progress Notes (Signed)
Suboxone refill

## 2022-05-04 ENCOUNTER — Other Ambulatory Visit: Payer: Self-pay

## 2022-05-04 ENCOUNTER — Inpatient Hospital Stay (HOSPITAL_BASED_OUTPATIENT_CLINIC_OR_DEPARTMENT_OTHER): Payer: Managed Care, Other (non HMO)

## 2022-05-04 ENCOUNTER — Encounter (HOSPITAL_COMMUNITY): Payer: Self-pay | Admitting: Obstetrics & Gynecology

## 2022-05-04 ENCOUNTER — Inpatient Hospital Stay (HOSPITAL_COMMUNITY)
Admission: AD | Admit: 2022-05-04 | Discharge: 2022-05-04 | Disposition: A | Discharge status: Home / Self Care | Payer: Managed Care, Other (non HMO) | Attending: Obstetrics & Gynecology | Admitting: Obstetrics & Gynecology

## 2022-05-04 ENCOUNTER — Telehealth: Payer: Self-pay | Admitting: General Practice

## 2022-05-04 DIAGNOSIS — M7989 Other specified soft tissue disorders: Secondary | ICD-10-CM | POA: Diagnosis not present

## 2022-05-04 DIAGNOSIS — Z8759 Personal history of other complications of pregnancy, childbirth and the puerperium: Secondary | ICD-10-CM | POA: Insufficient documentation

## 2022-05-04 DIAGNOSIS — O165 Unspecified maternal hypertension, complicating the puerperium: Secondary | ICD-10-CM | POA: Diagnosis present

## 2022-05-04 DIAGNOSIS — M79605 Pain in left leg: Secondary | ICD-10-CM | POA: Diagnosis not present

## 2022-05-04 DIAGNOSIS — R519 Headache, unspecified: Secondary | ICD-10-CM | POA: Diagnosis not present

## 2022-05-04 DIAGNOSIS — O9089 Other complications of the puerperium, not elsewhere classified: Secondary | ICD-10-CM | POA: Diagnosis not present

## 2022-05-04 LAB — CBC
HCT: 37.2 % (ref 36.0–46.0)
Hemoglobin: 12.3 g/dL (ref 12.0–15.0)
MCH: 31.5 pg (ref 26.0–34.0)
MCHC: 33.1 g/dL (ref 30.0–36.0)
MCV: 95.4 fL (ref 80.0–100.0)
Platelets: 267 10*3/uL (ref 150–400)
RBC: 3.9 MIL/uL (ref 3.87–5.11)
RDW: 11.3 % — ABNORMAL LOW (ref 11.5–15.5)
WBC: 4.7 10*3/uL (ref 4.0–10.5)
nRBC: 0 % (ref 0.0–0.2)

## 2022-05-04 LAB — COMPREHENSIVE METABOLIC PANEL
ALT: 29 U/L (ref 0–44)
AST: 24 U/L (ref 15–41)
Albumin: 3.2 g/dL — ABNORMAL LOW (ref 3.5–5.0)
Alkaline Phosphatase: 66 U/L (ref 38–126)
Anion gap: 6 (ref 5–15)
BUN: 9 mg/dL (ref 6–20)
CO2: 28 mmol/L (ref 22–32)
Calcium: 8.6 mg/dL — ABNORMAL LOW (ref 8.9–10.3)
Chloride: 106 mmol/L (ref 98–111)
Creatinine, Ser: 0.74 mg/dL (ref 0.44–1.00)
GFR, Estimated: >60 mL/min (ref >60)
Glucose, Bld: 85 mg/dL (ref 70–99)
Potassium: 3.7 mmol/L (ref 3.5–5.1)
Sodium: 140 mmol/L (ref 135–145)
Total Bilirubin: 0.5 mg/dL (ref 0.3–1.2)
Total Protein: 6.5 g/dL (ref 6.5–8.1)

## 2022-05-04 MED ORDER — LABETALOL HCL 5 MG/ML IV SOLN: 80.0000 mg | INTRAVENOUS | Status: DC | PRN

## 2022-05-04 MED ORDER — FUROSEMIDE 20 MG PO TABS
20.0000 mg | ORAL_TABLET | Freq: Every day | ORAL | Status: DC
Start: 2022-05-04 — End: 2022-05-04
  Administered 2022-05-04: 20 mg via ORAL
  Filled 2022-05-04: qty 1

## 2022-05-04 MED ORDER — NIFEDIPINE ER OSMOTIC RELEASE 30 MG PO TB24
30.0000 mg | ORAL_TABLET | Freq: Every day | ORAL | Status: DC
Start: 2022-05-04 — End: 2022-05-04
  Administered 2022-05-04: 30 mg via ORAL
  Filled 2022-05-04: qty 1

## 2022-05-04 MED ORDER — HYDRALAZINE HCL 20 MG/ML IJ SOLN: 10.0000 mg | INTRAMUSCULAR | Status: DC | PRN

## 2022-05-04 MED ORDER — LABETALOL HCL 5 MG/ML IV SOLN: 20.0000 mg | INTRAVENOUS | Status: DC | PRN

## 2022-05-04 MED ORDER — ACETAMINOPHEN-CAFFEINE 500-65 MG PO TABS
2.0000 | ORAL_TABLET | Freq: Once | ORAL | Status: AC
  Administered 2022-05-04: 2 via ORAL
  Filled 2022-05-04: qty 2

## 2022-05-04 MED ORDER — NIFEDIPINE ER OSMOTIC RELEASE 30 MG PO TB24
30.0000 mg | ORAL_TABLET | Freq: Every day | ORAL | 0 refills | Status: DC
Start: 2022-05-05 — End: 2022-08-26

## 2022-05-04 MED ORDER — NIFEDIPINE ER OSMOTIC RELEASE 30 MG PO TB24: 30.0000 mg | ORAL_TABLET | Freq: Every day | ORAL | 0 refills | Status: DC

## 2022-05-04 MED ORDER — FUROSEMIDE 20 MG PO TABS: 20.0000 mg | ORAL_TABLET | Freq: Two times a day (BID) | ORAL | 0 refills | Status: DC

## 2022-05-04 MED ORDER — LABETALOL HCL 5 MG/ML IV SOLN: 40.0000 mg | INTRAVENOUS | Status: DC | PRN

## 2022-05-04 NOTE — MAU Provider Note (Addendum)
History     CSN: [id number]  Arrival date and time: 05/04/22 1438   Event Date/Time   First Provider Initiated Contact with Patient 05/04/22 1526      Chief Complaint  Patient presents with   Hypertension    Kathy Bird is a 36 yo [id number]. She is s/p vaginal birth on 04/07/2022. She presents to MAU for evaluation of new onset elevated blood pressure at home. Patient states a nurse visited her home for a BP check and obtained two severe range readings. Patient denies history of HTN in her most recent pregnancy. Her previous pregnancy was c/b postpartum Preeclampsia for which she received Magnesium Sulfate  Headache This is a new problem, onset today. Pain score is 3/10. She denies aggravating or alleviating factors. She has not taken medication for this complaint. She denies visual disturbances and RUQ Pain  Swelling and pain in left lower extremity This is a recurrent problem for which patient received Doppler imaging on 03/22/2022. Patient states her left leg is becoming more painful over time. Pain score is 3/10. She has not taken medication for this complaint. She denies skin discoloration.   Patient is bottle feeding.  OB History     Gravida  4   Para  2   Term  2   Preterm  0   AB  2   Living  2      SAB  0   IAB  2   Ectopic  0   Multiple  0   Live Births  2        Obstetric Comments  Pre-eclampsia  with delivery         Past Medical History:  Diagnosis Date   Anxiety    Bipolar disorder (HCC)    Depression    Pregnancy induced hypertension    Schizophrenia (HCC)    Seizures (HCC)    as child. last seizure when 48yrs old    Past Surgical History:  Procedure Laterality Date   ADENOIDECTOMY      Family History  Problem Relation Age of Onset   Cancer Mother    Healthy Mother    Vision loss Father     Social History   Tobacco Use   Smoking status: Former    Years: 9.00    Types: Cigarettes    Quit date: 11/18/2021    Years  since quitting: 0.4   Smokeless tobacco: Never  Vaping Use   Vaping Use: Never used  Substance Use Topics   Alcohol use: Not Currently    Alcohol/week: 3.0 standard drinks of alcohol    Types: 3 Cans of beer per week    Comment: Social   Drug use: Not Currently    Types: Other-see comments    Comment: No Rx drugs since 11/18/2021    Allergies:  Allergies  Allergen Reactions   Peanut-Containing Drug Products Hives   Shellfish Allergy Hives, Itching and Swelling    Tongue swelling also    Medications Prior to Admission  Medication Sig Dispense Refill Last Dose   hydrOXYzine (VISTARIL) 25 MG capsule Take 1 capsule (25 mg total) by mouth 3 (three) times daily as needed for anxiety. 30 capsule 2 05/04/2022   ibuprofen (ADVIL) 200 MG tablet Take 400 mg by mouth every 6 (six) hours as needed.   05/04/2022 at 1330   melatonin 1 MG TABS tablet Take 1 tablet (1 mg total) by mouth at bedtime. 30 tablet 6 Past Week   Prenatal Vit-Fe  Fumarate-FA (PRENATAL VITAMINS) 28-0.8 MG TABS TAKE 1 TABLET BY MOUTH EVERY DAY 30 tablet 1 05/03/2022   sertraline (ZOLOFT) 50 MG tablet Take 1 tablet (50 mg total) by mouth daily. 30 tablet 2 05/04/2022   SUBOXONE 2-0.5 MG FILM Place 1 Film under the tongue 2 (two) times daily for 28 days. 56 each 0 05/04/2022   acetaminophen (TYLENOL) 500 MG tablet Take 2 tablets (1,000 mg total) by mouth every 6 (six) hours. 30 tablet 0    FeFum-FePoly-FA-B Cmp-C-Biot (INTEGRA PLUS) CAPS Take 1 capsule by mouth daily. 30 capsule 3    naloxone (NARCAN) nasal spray 4 mg/0.1 mL Use as needed to reverse opioid overdose 2 each 1    senna-docusate (SENOKOT-S) 8.6-50 MG tablet Take 2 tablets by mouth daily. 60 tablet 0     Review of Systems  Constitutional:  Negative for appetite change, chills and fever.  Eyes:  Positive for pain and visual disturbance (visual "spots").  Respiratory:  Negative for chest tightness and shortness of breath.   Cardiovascular:  Negative for chest pain.        Admits to sensation of "heart racing" last night.  Gastrointestinal:  Negative for abdominal pain, nausea and vomiting.  Genitourinary:  Negative for vaginal bleeding and vaginal discharge.  Musculoskeletal:        Admits to pain on the dorsal and lateral aspect of the left foot.  Neurological:  Negative for dizziness and numbness.   Physical Exam   Blood pressure (!) 158/92, pulse 71, temperature 98.8 F (37.1 C), temperature source Oral, resp. rate 16, height 5\' 4"  (1.626 m), weight 70.8 kg, SpO2 99 %, unknown if currently breastfeeding.  Physical Exam Constitutional:      General: She is not in acute distress.    Appearance: Normal appearance. She is normal weight. She is not toxic-appearing.  HENT:     Head: Normocephalic and atraumatic.  Cardiovascular:     Rate and Rhythm: Normal rate and regular rhythm.     Pulses: Normal pulses.     Heart sounds: Normal heart sounds. No murmur heard.    No friction rub. No gallop.  Pulmonary:     Effort: Pulmonary effort is normal. No respiratory distress.  Chest:     Chest wall: No tenderness.  Musculoskeletal:        General: Swelling and tenderness present.     Cervical back: Normal range of motion and neck supple.     Left lower leg: Left lower leg edema: left lower leg +1 pitting edema up to the mid-shin.  Skin:    General: Skin is warm and dry.  Neurological:     General: No focal deficit present.     Mental Status: She is alert and oriented to person, place, and time. Mental status is at baseline.  Psychiatric:        Mood and Affect: Mood normal.        Behavior: Behavior normal.        Thought Content: Thought content normal.        Judgment: Judgment normal.     MAU Course  Procedures  MDM Orders Placed This Encounter  Procedures   CBC   Comprehensive metabolic panel   Notify physician (specify) Confirmatory reading of BP> 160/110 15 minutes later   Measure blood pressure   Measure blood pressure   Insert  peripheral IV   Saline lock IV   Discharge patient   VAS LOWER EXTREMITY VENOUS (DVT) (ONLY [facility] &  [facility])   Patient Vitals for the past 24 hrs:  BP Temp Temp src Pulse Resp SpO2 Height Weight  05/04/22 1634 (!) 153/92 -- -- 67 -- -- -- --  05/04/22 1623 (!) 146/87 -- -- 67 -- -- -- --  05/04/22 1556 -- -- -- -- -- 98 % -- --  05/04/22 1545 -- -- -- -- -- 99 % -- --  05/04/22 1543 (!) 158/92 -- -- 71 -- -- -- --  05/04/22 1541 -- -- -- -- -- 99 % -- --  05/04/22 1531 (!) 154/99 -- -- 76 -- 97 % -- --  05/04/22 1526 (!) 156/96 -- -- 76 -- 98 % -- --  05/04/22 1521 -- -- -- -- -- 97 % -- --  05/04/22 1520 (!) 156/96 -- -- 79 -- -- -- --  05/04/22 1502 (!) 160/101 98.8 F (37.1 C) Oral 77 16 98 % 5\' 4"  (1.626 m) 70.8 kg   Results for orders placed or performed during the hospital encounter of 05/04/22 (from the past 24 hour(s))  CBC     Status: Abnormal   Collection Time: 05/04/22  4:02 PM  Result Value Ref Range   WBC 4.7 4.0 - 10.5 K/uL   RBC 3.90 3.87 - 5.11 MIL/uL   Hemoglobin 12.3 12.0 - 15.0 g/dL   HCT 24.8 25.0 - 03.7 %   MCV 95.4 80.0 - 100.0 fL   MCH 31.5 26.0 - 34.0 pg   MCHC 33.1 30.0 - 36.0 g/dL   RDW 04.8 (L) 88.9 - 16.9 %   Platelets 267 150 - 400 K/uL   nRBC 0.0 0.0 - 0.2 %  Comprehensive metabolic panel     Status: Abnormal   Collection Time: 05/04/22  4:02 PM  Result Value Ref Range   Sodium 140 135 - 145 mmol/L   Potassium 3.7 3.5 - 5.1 mmol/L   Chloride 106 98 - 111 mmol/L   CO2 28 22 - 32 mmol/L   Glucose, Bld 85 70 - 99 mg/dL   BUN 9 6 - 20 mg/dL   Creatinine, Ser 4.50 0.44 - 1.00 mg/dL   Calcium 8.6 (L) 8.9 - 10.3 mg/dL   Total Protein 6.5 6.5 - 8.1 g/dL   Albumin 3.2 (L) 3.5 - 5.0 g/dL   AST 24 15 - 41 U/L   ALT 29 0 - 44 U/L   Alkaline Phosphatase 66 38 - 126 U/L   Total Bilirubin 0.5 0.3 - 1.2 mg/dL   GFR, Estimated >38 >88 mL/min   Anion gap 6 5 - 15   VAS [place] LOWER EXTREMITY VENOUS (DVT) (ONLY [facility] & [facility])  Result Date: 05/04/2022  Lower Venous  DVT Study Patient Name:  KATI SHAFER  Date of Exam:   05/04/2022 Medical Rec #: [id number]     Accession #:    [id number] Date of Birth: February 22, 1986     Patient Gender: F Patient Age:   89 years Exam Location:  [facility] Procedure:      VAS [place] LOWER EXTREMITY VENOUS (DVT) Referring Phys: Clayton Bibles --------------------------------------------------------------------------------  Indications: Swelling.  Risk Factors: None identified. Limitations: Poor ultrasound/tissue interface. Comparison Study: No prior studies. Performing Technologist: Chanda Busing RVT  Examination Guidelines: A complete evaluation includes B-mode imaging, spectral Doppler, color Doppler, and power Doppler as needed of all accessible portions of each vessel. Bilateral testing is considered an integral part of a complete examination. Limited examinations for reoccurring indications may be performed as noted. The reflux portion  of the exam is performed with the patient in reverse Trendelenburg.  +-----+---------------+---------+-----------+----------+--------------+ RIGHTCompressibilityPhasicitySpontaneityPropertiesThrombus Aging +-----+---------------+---------+-----------+----------+--------------+ CFV  Full           Yes      Yes                                 +-----+---------------+---------+-----------+----------+--------------+   +---------+---------------+---------+-----------+----------+--------------+ LEFT     CompressibilityPhasicitySpontaneityPropertiesThrombus Aging +---------+---------------+---------+-----------+----------+--------------+ CFV      Full           Yes      Yes                                 +---------+---------------+---------+-----------+----------+--------------+ SFJ      Full                                                        +---------+---------------+---------+-----------+----------+--------------+ FV Prox  Full                                                         +---------+---------------+---------+-----------+----------+--------------+ FV Mid   Full                                                        +---------+---------------+---------+-----------+----------+--------------+ FV DistalFull                                                        +---------+---------------+---------+-----------+----------+--------------+ PFV      Full                                                        +---------+---------------+---------+-----------+----------+--------------+ POP      Full           Yes      Yes                                 +---------+---------------+---------+-----------+----------+--------------+ PTV      Full                                                        +---------+---------------+---------+-----------+----------+--------------+ PERO     Full                                                        +---------+---------------+---------+-----------+----------+--------------+  Summary: RIGHT: - No evidence of common femoral vein obstruction.  LEFT: - There is no evidence of deep vein thrombosis in the lower extremity.  - No cystic structure found in the popliteal fossa.  *See table(s) above for measurements and observations. Electronically signed by Harold Barban MD on 05/04/2022 at 5:58:11 PM.    Final     Meds ordered this encounter  Medications   AND Linked Order Group    labetalol (NORMODYNE) injection 20 mg    labetalol (NORMODYNE) injection 40 mg    labetalol (NORMODYNE) injection 80 mg    hydrALAZINE (APRESOLINE) injection 10 mg   acetaminophen-caffeine (EXCEDRIN TENSION HEADACHE) 500-65 MG per tablet 2 tablet   NIFEdipine (PROCARDIA-XL/NIFEDICAL-XL) 24 hr tablet 30 mg   furosemide (LASIX) tablet 20 mg   furosemide (LASIX) 20 MG tablet    Sig: Take 1 tablet (20 mg total) by mouth 2 (two) times daily for 5 days.    Dispense:  10 tablet    Refill:  0    Order Specific Question:    Supervising Provider    Answer:   Verita Schneiders A [[id number]]   DISCONTD: NIFEdipine (PROCARDIA-XL/NIFEDICAL-XL) 30 MG 24 hr tablet    Sig: Take 1 tablet (30 mg total) by mouth daily. Can increase to twice a day as needed for symptomatic contractions    Dispense:  30 tablet    Refill:  0    Order Specific Question:   Supervising Provider    Answer:   Verita Schneiders A [[id number]]   NIFEdipine (PROCARDIA-XL/NIFEDICAL-XL) 30 MG 24 hr tablet    Sig: Take 1 tablet (30 mg total) by mouth daily.    Dispense:  30 tablet    Refill:  0    Order Specific Question:   Supervising Provider    Answer:   Verita Schneiders A [id number]   Assessment and Plan  --36 y.o. [id number] s/p vaginal birth 04/07/2022 --Postpartum Hypertension --PEC labs WNL --Headache resolved with Excedrin Tension --Venous doppler negative for DVT --Initiate Procardia XL 30 mg daily --Initiate Lasix x 5 days per inpatient protocol --Discharge home in stable condition  F/U: Patient needs BP check no later than Friday. High alert message sent to [facility] Discussed with patient there may not be openings for BP check in office. If that occurs, come to MAU for BP check  Mallie Snooks, [place], MSN, CNM Certified Nurse Midwife, [employer] for [employer], [place]

## 2022-05-04 NOTE — Telephone Encounter (Signed)
Received telephone call from a nurse with the [facility] Department stating she just left the patient's house recently after a check in and blood pressure check following delivery. She reports the patient had a BP of 172/113 & 178/114. The patient reported a headache at the time but no visual changes. Patient also was complaining of left lower leg pain rated at a 7 out of 10. The nurse noticed +2 to +3 pitting edema in that leg with her skin looking really tight.   Called patient and discussed with her. Recommended she go to MAU now for evaluation as we are concerned about her BP and risk of a blood clot. Patient verbalized understanding. MAU providers notified.

## 2022-05-04 NOTE — MAU Note (Addendum)
.  Kathy Bird is a 36 y.o. at Unknown here in MAU reporting: nurse came to house today to do a postpartum b/p check and b/p was elevated. Pt also reports she is continued to have swelling in her left lower leg. Right leg is not swollen. Left leg has been hurting but the pain is getting worse. Pt reports she had an ultrasound of her lower leg about 2 months ago that was negative. Headache off/on for 2 weeks, tylenol helps. S/p vaginal delivery on 06/08 Onset of complaint:  2 months ago Pain score: 3/10 headache                     3/10 left lower leg  Vitals:   05/04/22 1502  BP: (!) 160/101  Pulse: 77  Resp: 16  Temp: 98.8 F (37.1 C)  SpO2: 98%     FHT:na Lab orders placed from triage:

## 2022-05-04 NOTE — Progress Notes (Signed)
Left lower extremity venous duplex has been completed. Preliminary results can be found in CV Proc through chart review.  Results were given to [facility] CNM.  05/04/22 4:19 PM Olen Cordial RVT

## 2022-05-05 ENCOUNTER — Telehealth: Payer: Self-pay | Admitting: Family Medicine

## 2022-05-05 NOTE — Telephone Encounter (Signed)
Called patient to make her aware of the appointment scheduled, there was no answer to the phone call so a voicemail was left with the call back number for the office.

## 2022-05-06 ENCOUNTER — Ambulatory Visit: Payer: Managed Care, Other (non HMO)

## 2022-05-10 ENCOUNTER — Ambulatory Visit: Payer: Managed Care, Other (non HMO) | Admitting: Medical

## 2022-05-10 NOTE — Progress Notes (Deleted)
    Post Partum Visit Note  Kathy HUEBERT is a 36 y.o. [phone] female who presents for a postpartum visit. She is 4 weeks postpartum following a normal spontaneous vaginal delivery.  I have fully reviewed the prenatal and intrapartum course. The delivery was at *** gestational weeks.  Anesthesia: local and epidural. Postpartum course has been ***. Baby is doing well***. Baby is feeding by both breast and bottle - {formula:72}. Bleeding {vag bleed:12292}. Bowel function is {normal:32111}. Bladder function is {normal:32111}. Patient {is/is not:9024} sexually active. Contraception method is IUD. Postpartum depression screening: {gen negative/positive:315881}.   The pregnancy intention screening data noted above was reviewed. Potential methods of contraception were discussed. The patient elected to proceed with No data recorded.    Health Maintenance Due  Topic Date Due   COVID-19 Vaccine (1) Never done   PAP SMEAR-Modifier  Never done    {Common ambulatory SmartLinks:19316}  Review of Systems {ros; complete:30496}  Objective:  There were no vitals taken for this visit.   General:  {gen appearance:16600}   Breasts:  {desc; normal/abnormal/not indicated:14647}  Lungs: {lung exam:16931}  Heart:  {heart exam:5510}  Abdomen: {abdomen exam:16834}   Wound {Wound assessment:11097}  GU exam:  {desc; normal/abnormal/not indicated:14647}       Assessment:    There are no diagnoses linked to this encounter.  *** postpartum exam.   Plan:   Essential components of care per ACOG recommendations:  1.  Mood and well being: Patient with {gen negative/positive:315881} depression screening today. Reviewed local resources for support.  - Patient tobacco use? {tobacco use:25506}  - hx of drug use? {yes/no:25505}    2. Infant care and feeding:  -Patient currently breastmilk feeding? {yes/no:25502}  -Social determinants of health (SDOH) reviewed in EPIC. No concerns***The following needs were  identified***  3. Sexuality, contraception and birth spacing - Patient {DOES_DOES XBW:62035} want a pregnancy in the next year.  Desired family size is {NUMBER 1-10:22536} children.  - Reviewed reproductive life planning. Reviewed contraceptive methods based on pt preferences and effectiveness.  Patient desired {Upstream End Methods:24109} today.   - Discussed birth spacing of 18 months  4. Sleep and fatigue -Encouraged family/partner/community support of 4 hrs of uninterrupted sleep to help with mood and fatigue  5. Physical Recovery  - Discussed patients delivery and complications. She describes her labor as {description:25511} - Patient had a {CHL AMB DELIVERY:[phone]}. Patient had a {laceration:25518} laceration. Perineal healing reviewed. Patient expressed understanding - Patient has urinary incontinence? {yes/no:25515} - Patient {ACTION; IS/IS DHR:41638453} safe to resume physical and sexual activity  6.  Health Maintenance - HM due items addressed {Yes or If no, why not?:20788} - Last pap smear No results found for: "DIAGPAP" Pap smear {done:10129} at today's visit.  -Breast Cancer screening indicated? {indicated:25516}  7. Chronic Disease/Pregnancy Condition follow up: {Follow up:25499}  - PCP follow up  Isabell Jarvis, RN Center for [employer], [facility]

## 2022-05-12 ENCOUNTER — Other Ambulatory Visit: Payer: Self-pay

## 2022-05-12 ENCOUNTER — Encounter: Payer: Self-pay | Admitting: Family Medicine

## 2022-05-12 ENCOUNTER — Ambulatory Visit: Payer: Managed Care, Other (non HMO) | Admitting: Family Medicine

## 2022-05-12 DIAGNOSIS — F1110 Opioid abuse, uncomplicated: Secondary | ICD-10-CM

## 2022-05-12 DIAGNOSIS — Z8759 Personal history of other complications of pregnancy, childbirth and the puerperium: Secondary | ICD-10-CM

## 2022-05-12 NOTE — Progress Notes (Signed)
Post Partum Visit Note  Kathy Bird is a 36 y.o. [phone] female who presents for a postpartum visit. She is 5 weeks postpartum following a normal spontaneous vaginal delivery.  I have fully reviewed the prenatal and intrapartum course. The delivery was at [redacted]w[redacted]d gestational weeks.  Anesthesia: epidural. Postpartum course has been uncomplicated-- except for ongoing suboxone use/OUD management that has been appropriate. Baby is doing well. Baby is feeding by both breast and bottle - Lucien Mons Start . Bleeding staining only. Bowel function is normal. Bladder function is normal. Patient is not sexually active. Contraception method is IUD. Postpartum depression screening: negative.   The pregnancy intention screening data noted above was reviewed. Potential methods of contraception were discussed. The patient elected to proceed with No data recorded.   Edinburgh Postnatal Depression Scale - 05/12/22 1544       Edinburgh Postnatal Depression Scale:  In the Past 7 Days   I have been able to laugh and see the funny side of things. 0    I have looked forward with enjoyment to things. 0    I have blamed myself unnecessarily when things went wrong. 0    I have been anxious or worried for no good reason. 0    I have felt scared or panicky for no good reason. 0    Things have been getting on top of me. 0    I have been so unhappy that I have had difficulty sleeping. 0    I have felt sad or miserable. 0    I have been so unhappy that I have been crying. 0    The thought of harming myself has occurred to me. 0    Edinburgh Postnatal Depression Scale Total 0             Health Maintenance Due  Topic Date Due   COVID-19 Vaccine (1) Never done   PAP SMEAR-Modifier  Never done    The following portions of the patient's history were reviewed and updated as appropriate: allergies, current medications, past family history, past medical history, past social history, past surgical history, and  problem list.  Review of Systems Pertinent items are noted in HPI.  Objective:  BP (!) 136/93   Pulse 99   Wt 143 lb 8 oz (65.1 kg)   Breastfeeding No   BMI 24.63 kg/m    General:  alert, cooperative, and appears stated age   Breasts:  not indicated  Lungs: Normal WOB  Heart:  regular rate and rhythm, S1, S2 normal, no murmur, click, rub or gallop  Abdomen: soft, non-tender; bowel sounds normal; no masses,  no organomegaly   Wound nA  GU exam:  not indicated- declined by patient       Assessment:   Normal postpartum exam.   Plan:   Essential components of care per ACOG recommendations:  1.  Mood and well being: Patient with negative depression screening today. Reviewed local resources for support.  - Patient tobacco use? No.   - hx of drug use? No.  History of OUD  2. Infant care and feeding:  -Patient currently breastmilk feeding? No.  -Social determinants of health (SDOH) reviewed in EPIC. No concerns  3. Sexuality, contraception and birth spacing - Patient does not want a pregnancy in the next year.  Desired family size is 2 children.  - Reviewed reproductive life planning. Reviewed contraceptive methods based on pt preferences and effectiveness.  Patient desired IUD or IUS --scheduled  for future visit - Discussed birth spacing of 18 months  4. Sleep and fatigue -Encouraged family/partner/community support of 4 hrs of uninterrupted sleep to help with mood and fatigue  5. Physical Recovery  - Discussed patients delivery and complications. She describes her labor as good. - Patient had a Vaginal, no problems at delivery. . Perineal healing reviewed. Patient expressed understanding - Patient has urinary incontinence? No. - Patient is safe to resume physical and sexual activity-- after IUD placement  6.  Health Maintenance - HM due items addressed No - patient deferred to future visit - Last pap smear No results found for: "DIAGPAP" Pap smear not done at today's  visit.  -Breast Cancer screening indicated? No.   7. Chronic Disease/Pregnancy Condition follow up:  1. Postpartum exam  2. Opioid use disorder, mild, abuse (HCC) - ToxASSURE Select 13 (MW), Urine - [employer] to take this over prescription but good to readdress at future visits.   3. History of pre-eclampsia  - PCP follow up  Federico Flake, MD Center for [facility], [facility]

## 2022-05-18 LAB — TOXASSURE SELECT 13 (MW), URINE

## 2022-05-23 ENCOUNTER — Inpatient Hospital Stay: Payer: Managed Care, Other (non HMO) | Attending: Internal Medicine

## 2022-05-23 ENCOUNTER — Inpatient Hospital Stay: Payer: Managed Care, Other (non HMO) | Admitting: Internal Medicine

## 2022-05-26 ENCOUNTER — Other Ambulatory Visit (HOSPITAL_COMMUNITY)
Admission: RE | Admit: 2022-05-26 | Discharge: 2022-05-26 | Disposition: A | Discharge status: Home / Self Care | Payer: Managed Care, Other (non HMO) | Source: Ambulatory Visit | Attending: Family Medicine | Admitting: Family Medicine

## 2022-05-26 ENCOUNTER — Encounter: Payer: Self-pay | Admitting: Family Medicine

## 2022-05-26 ENCOUNTER — Ambulatory Visit (INDEPENDENT_AMBULATORY_CARE_PROVIDER_SITE_OTHER): Payer: Managed Care, Other (non HMO) | Admitting: Family Medicine

## 2022-05-26 VITALS — BP 120/86 | HR 101 | Wt 154.5 lb

## 2022-05-26 DIAGNOSIS — Z124 Encounter for screening for malignant neoplasm of cervix: Secondary | ICD-10-CM | POA: Insufficient documentation

## 2022-05-26 DIAGNOSIS — Z30430 Encounter for insertion of intrauterine contraceptive device: Secondary | ICD-10-CM | POA: Diagnosis not present

## 2022-05-26 MED ORDER — LEVONORGESTREL 20.1 MCG/DAY IU IUD
1.0000 | INTRAUTERINE_SYSTEM | Freq: Once | INTRAUTERINE | Status: AC
  Administered 2022-05-26: 1 via INTRAUTERINE

## 2022-05-26 NOTE — Progress Notes (Signed)
   Contraception visit  S: here today for contraceptive management. Desires a delay of > 1 year to next baby. She has not resumed sex.   O: BP 120/86   Pulse (!) 101   Wt 154 lb 8 oz (70.1 kg)   LMP 05/19/2022 (Exact Date)   Breastfeeding No   BMI 26.52 kg/m    IUD Insertion Procedure Note Patient identified, informed consent performed, consent signed.   Discussed risks of irregular bleeding, cramping, infection, malpositioning or misplacement of the IUD outside the uterus which may require further procedure such as laparoscopy. Time out was performed.  Has not had sex  Speculum placed in the vagina.  Cervix visualized.  Collected pap smear. Cleaned with Betadine x 2.  Grasped anteriorly with a single tooth tenaculum.  Uterus sounded to 7 cm.  IUD placed per manufacturer's recommendations.  Strings trimmed to 2 cm. Tenaculum was removed, good hemostasis noted.  Patient tolerated procedure well.   Assessment and Plan:  Patient was given post-procedure instructions.  She was advised to have backup contraception for one week.  Patient was also asked to check IUD strings periodically and follow up in 4-6 weeks for IUD check. Recommend IUD check at next visit for her infant that is part of [facility]  Follow up PAP results  Return in about 2 months (around 07/27/2022) for IUD string check.  No future appointments.   Federico Flake, MD, MPH, ABFM Medical Director  [facility] Department

## 2022-06-02 LAB — CYTOLOGY - PAP
Chlamydia: NEGATIVE
Comment: NEGATIVE
Comment: NEGATIVE
Comment: NORMAL
Diagnosis: NEGATIVE
Diagnosis: REACTIVE
High risk HPV: NEGATIVE
Neisseria Gonorrhea: NEGATIVE

## 2022-06-03 ENCOUNTER — Ambulatory Visit: Payer: Managed Care, Other (non HMO) | Admitting: Cardiology

## 2022-06-13 ENCOUNTER — Encounter: Payer: Self-pay | Admitting: Cardiology

## 2022-06-17 ENCOUNTER — Ambulatory Visit: Payer: Managed Care, Other (non HMO) | Admitting: Cardiology

## 2022-06-27 ENCOUNTER — Other Ambulatory Visit (HOSPITAL_COMMUNITY): Payer: Self-pay | Admitting: Nurse Practitioner

## 2022-06-27 DIAGNOSIS — R0609 Other forms of dyspnea: Secondary | ICD-10-CM

## 2022-06-28 ENCOUNTER — Ambulatory Visit (HOSPITAL_COMMUNITY)
Admission: RE | Admit: 2022-06-28 | Discharge: 2022-06-28 | Discharge status: Home / Self Care | Payer: Managed Care, Other (non HMO) | Source: Ambulatory Visit | Attending: Nurse Practitioner | Admitting: Nurse Practitioner

## 2022-06-28 ENCOUNTER — Encounter (HOSPITAL_COMMUNITY): Payer: Self-pay

## 2022-06-28 DIAGNOSIS — R0609 Other forms of dyspnea: Secondary | ICD-10-CM | POA: Insufficient documentation

## 2022-06-28 LAB — ECHOCARDIOGRAM COMPLETE
Area-P 1/2: 3.85 cm2
Calc EF: 68 %
S' Lateral: 2.8 cm
Single Plane A2C EF: 70.6 %
Single Plane A4C EF: 66.2 %

## 2022-07-19 ENCOUNTER — Other Ambulatory Visit: Payer: Self-pay

## 2022-07-19 NOTE — Progress Notes (Signed)
Work note per Dr Dione Plover.  Colletta [place], RNC

## 2022-07-25 NOTE — Progress Notes (Deleted)
Cardio-Obstetrics Clinic  New Evaluation  Date:  07/28/2022   ID:  BRINDLEY MADARANG, DOB 20-Feb-1986, MRN [id number]  PCP:  Venora Maples, MD   [facility] Providers Cardiologist:  None  Electrophysiologist:  None     Referring MD: Levonne Lapping, NP   Chief Complaint: DOE  History of Present Illness:    Kathy Bird is a 36 y.o. female [G4P2022] who is being seen today for the evaluation of DOE at the request of Levonne Lapping, NP.   Patient with history of bipolar I disorder, opioid abuse, and pre-elampsia.  Was seen in clinic by Levonne Lapping, NP on 06/27/22 where she was continuing to have elevated blood pressures in the post partum period. She was also complaining of LE edema, dyspnea on exertion and SOB when laying flat. TTE 07/26/2022 showed LVEF 60-65%, normal RV, no significant valve disease. Given symptoms, she is now referred to Cardio Ob clinic for further work-up.  Today, ***   Prior CV Studies Reviewed: The following studies were reviewed today: TTE 07-26-22: IMPRESSIONS     1. Left ventricular ejection fraction, by estimation, is 60 to 65%. The  left ventricle has normal function. The left ventricle has no regional  wall motion abnormalities. Left ventricular diastolic parameters are  indeterminate.   2. Right ventricular systolic function is normal. The right ventricular  size is normal. Tricuspid regurgitation signal is inadequate for assessing  PA pressure.   3. The mitral valve is normal in structure. No evidence of mitral valve  regurgitation.   4. The aortic valve is tricuspid. Aortic valve regurgitation is not  visualized.   5. The inferior vena cava is normal in size with greater than 50%  respiratory variability, suggesting right atrial pressure of 3 mmHg.   Comparison(s): No prior Echocardiogram.   Conclusion(s)/Recommendation(s): Normal biventricular function without  evidence of hemodynamically significant valvular heart disease.    Past Medical History:  Diagnosis Date   Anxiety    Bipolar disorder (HCC)    Depression    Pregnancy induced hypertension    Schizophrenia (HCC)    Seizures (HCC)    as child. last seizure when 15yrs old    Past Surgical History:  Procedure Laterality Date   ADENOIDECTOMY     { Click here to update PMH, PSH, OB Hx then refresh note  :1}   OB History     Gravida  4   Para  2   Term  2   Preterm  0   AB  2   Living  2      SAB  0   IAB  2   Ectopic  0   Multiple  0   Live Births  2        Obstetric Comments  Pre-eclampsia  with delivery         { Click here to update OB Charting then refresh note  :1}    Current Medications: No outpatient medications have been marked as taking for the 07/29/22 encounter (Appointment) with Meriam Sprague, MD.     Allergies:   Peanut-containing drug products and Shellfish allergy   Social History   Socioeconomic History   Marital status: Divorced    Spouse name: Not on file   Number of children: Not on file   Years of education: Not on file   Highest education level: Not on file  Occupational History   Not on file  Tobacco Use   Smoking status:  Former    Years: 9.00    Types: Cigarettes    Quit date: 11/18/2021    Years since quitting: 0.6   Smokeless tobacco: Never  Vaping Use   Vaping Use: Never used  Substance and Sexual Activity   Alcohol use: Not Currently    Alcohol/week: 3.0 standard drinks of alcohol    Types: 3 Cans of beer per week    Comment: Social   Drug use: Not Currently    Types: Other-see comments    Comment: No Rx drugs since 11/18/2021   Sexual activity: Yes    Birth control/protection: None  Other Topics Concern   Not on file  Social History Narrative   Not on file   Social Determinants of Health   Financial Resource Strain: Not on file  Food Insecurity: No Food Insecurity (04/05/2022)   Hunger Vital Sign    Worried About Running Out of Food in the Last Year: Never  true    Ran Out of Food in the Last Year: Never true  Transportation Needs: No Transportation Needs (04/05/2022)   PRAPARE - Hydrologist (Medical): No    Lack of Transportation (Non-Medical): No  Physical Activity: Not on file  Stress: Not on file  Social Connections: Not on file  { Click here to update SDOH then refresh :1}    Family History  Problem Relation Age of Onset   Cancer Mother    Healthy Mother    Vision loss Father    { Click here to update FH then refresh note    :1}   ROS:   Please see the history of present illness.    *** All other systems reviewed and are negative.   Labs/EKG Reviewed:    EKG:   EKG is *** ordered today.  The ekg ordered today demonstrates ***  Recent Labs: 05/04/2022: ALT 29; BUN 9; Creatinine, Ser 0.74; Hemoglobin 12.3; Platelets 267; Potassium 3.7; Sodium 140   Recent Lipid Panel No results found for: "CHOL", "TRIG", "HDL", "CHOLHDL", "LDLCALC", "LDLDIRECT"  Physical Exam:    VS:  There were no vitals taken for this visit.    Wt Readings from Last 3 Encounters:  05/26/22 154 lb 8 oz (70.1 kg)  05/12/22 143 lb 8 oz (65.1 kg)  05/04/22 156 lb (70.8 kg)     GEN: *** Well nourished, well developed in no acute distress HEENT: Normal NECK: No JVD; No carotid bruits LYMPHATICS: No lymphadenopathy CARDIAC: ***RRR, no murmurs, rubs, gallops RESPIRATORY:  Clear to auscultation without rales, wheezing or rhonchi  ABDOMEN: Soft, non-tender, non-distended MUSCULOSKELETAL:  No edema; No deformity  SKIN: Warm and dry NEUROLOGIC:  Alert and oriented x 3 PSYCHIATRIC:  Normal affect    Risk Assessment/Risk Calculators:   { Click to calculate CARPREG II - THEN refresh note :1}    { Click to caclulate Mod WHO Class of CV Risk - THEN refresh note :1}     { Click for [id number] Score - THEN Refresh Note    :[id number]}      ASSESSMENT & PLAN:    #History of Pre-Eclampsia: #Post-partum  HTN:   #Dyspnea: TTE reassuring with normal BiV function, no significant valve disease, normal filling pressures. May be related to poorly controlled HTN. Will ***   There are no Patient Instructions on file for this visit.   Dispo:  No follow-ups on file.   Medication Adjustments/Labs and Tests Ordered: Current medicines are reviewed at length with the  patient today.  Concerns regarding medicines are outlined above.  Tests Ordered: No orders of the defined types were placed in this encounter.  Medication Changes: No orders of the defined types were placed in this encounter.

## 2022-07-29 ENCOUNTER — Ambulatory Visit: Payer: Managed Care, Other (non HMO) | Admitting: Cardiology

## 2022-08-01 ENCOUNTER — Encounter: Payer: Self-pay | Admitting: Family Medicine

## 2022-08-01 DIAGNOSIS — R6889 Other general symptoms and signs: Secondary | ICD-10-CM

## 2022-08-08 ENCOUNTER — Other Ambulatory Visit: Payer: Managed Care, Other (non HMO)

## 2022-08-09 ENCOUNTER — Ambulatory Visit (INDEPENDENT_AMBULATORY_CARE_PROVIDER_SITE_OTHER): Payer: Managed Care, Other (non HMO) | Admitting: Family Medicine

## 2022-08-09 ENCOUNTER — Other Ambulatory Visit: Payer: Self-pay

## 2022-08-09 ENCOUNTER — Encounter: Payer: Self-pay | Admitting: Family Medicine

## 2022-08-09 VITALS — BP 152/88 | HR 76

## 2022-08-09 DIAGNOSIS — R6889 Other general symptoms and signs: Secondary | ICD-10-CM | POA: Diagnosis not present

## 2022-08-09 DIAGNOSIS — F1110 Opioid abuse, uncomplicated: Secondary | ICD-10-CM | POA: Diagnosis not present

## 2022-08-09 MED ORDER — BUPRENORPHINE HCL-NALOXONE HCL 2-0.5 MG SL FILM
1.0000 | ORAL_FILM | Freq: Four times a day (QID) | SUBLINGUAL | 0 refills | Status: DC
  Filled 2022-08-09: qty 120, 30d supply, fill #0
  Filled 2022-08-15 – 2022-08-24 (×4): qty 60, 15d supply, fill #0

## 2022-08-09 NOTE — Assessment & Plan Note (Signed)
Desires to switch providers. I tried to encourage her to talk about her concerns to her provider and make things work but she is very firm in her decision. I told her I am happy to take on prescribing duties as I did during pregnancy as I normally go out to one year postpartum regardless for my patients. At that point we can re-evaluate. Also encouraged her to increase her dose to 2 mg QID, new rx sent.

## 2022-08-09 NOTE — Progress Notes (Signed)
[facility] GYNECOLOGY OFFICE VISIT NOTE  History:   Kathy Bird is a 36 y.o. [id number] here today for IUD check, OUD follow up.  Needed to get thyroid studies done so decided to come as well for visit Still having lots of problems with joint pains, very affected by cold outside, and numbness and tingling in fingertips  [place] 2 mg TID Admits that she feels like her suboxone provider shames her She has been wanting to go up but is afraid of how her provider and their office staff will react and treat her She has been very stressed of late due to changes in her job Very, very worried about relapsing  Health Maintenance Due  Topic Date Due   COVID-19 Vaccine (1) Never done   INFLUENZA VACCINE  05/31/2022    Past Medical History:  Diagnosis Date   Anxiety    Bipolar disorder ([place])    Depression    Pregnancy induced hypertension    Schizophrenia ([place])    Seizures ([place])    as child. last seizure when 89yrs old    Past Surgical History:  Procedure Laterality Date   ADENOIDECTOMY      The following portions of the patient's history were reviewed and updated as appropriate: allergies, current medications, past family history, past medical history, past social history, past surgical history and problem list.   Health Maintenance:   Last pap: Lab Results  Component Value Date   DIAGPAP  05/26/2022    - Negative for Intraepithelial Lesions or Malignancy (NILM)   DIAGPAP - Benign reactive/reparative changes 05/26/2022   HPVHIGH Negative 05/26/2022    Last mammogram:  N/a    Review of Systems:  Pertinent items noted in HPI and remainder of comprehensive ROS otherwise negative.  Physical Exam:  BP (!) 152/88   Pulse 76  CONSTITUTIONAL: Well-developed, well-nourished female in no acute distress.  HEENT:  Normocephalic, atraumatic. External right and left ear normal. No scleral icterus.  NECK: Normal range of motion, supple, no  masses noted on observation SKIN: No rash noted. Not diaphoretic. No erythema. No pallor. MUSCULOSKELETAL: Normal range of motion. No edema noted. NEUROLOGIC: Alert and oriented to person, place, and time. Normal muscle tone coordination.  PSYCHIATRIC: Anxious mood and affect RESPIRATORY: Effort normal, no problems with respiration noted   Labs and Imaging No results found for this or any previous visit (from the past 168 hour(s)). No results found.    Assessment and Plan:   Problem List Items Addressed This Visit       Other   Opioid use disorder, mild, abuse ([place]) - Primary    Desires to switch providers. I tried to encourage her to talk about her concerns to her provider and make things work but she is very firm in her decision. I told her I am happy to take on prescribing duties as I did during pregnancy as I normally go out to one year postpartum regardless for my patients. At that point we can re-evaluate. Also encouraged her to increase her dose to 2 mg QID, new rx sent.       Relevant Medications   Buprenorphine HCl-Naloxone HCl (SUBOXONE) 2-0.5 MG FILM   Other Relevant Orders   ToxASSURE Select 13 (MW), Urine   Other Visit Diagnoses     Cold intolerance           Routine preventative health maintenance measures emphasized. Please refer to After Visit Summary for other  counseling recommendations.   Return in about 4 weeks (around 09/06/2022) for OUD f/u.    Total face-to-face time with patient: 30 minutes.  Over 50% of encounter was spent on counseling and coordination of care.   Venora Maples, MD/MPH Attending Family Medicine Physician, [facility] for [facility], [facility]

## 2022-08-10 LAB — TSH RFX ON ABNORMAL TO FREE T4: TSH: 1.29 u[IU]/mL (ref 0.450–4.500)

## 2022-08-11 LAB — TOXASSURE SELECT 13 (MW), URINE

## 2022-08-15 ENCOUNTER — Other Ambulatory Visit: Payer: Self-pay

## 2022-08-16 ENCOUNTER — Other Ambulatory Visit: Payer: Self-pay

## 2022-08-17 ENCOUNTER — Other Ambulatory Visit: Payer: Self-pay

## 2022-08-24 ENCOUNTER — Other Ambulatory Visit: Payer: Self-pay

## 2022-08-26 ENCOUNTER — Encounter: Payer: Self-pay | Admitting: Cardiology

## 2022-08-26 ENCOUNTER — Other Ambulatory Visit: Payer: Self-pay

## 2022-08-26 ENCOUNTER — Ambulatory Visit (INDEPENDENT_AMBULATORY_CARE_PROVIDER_SITE_OTHER): Payer: Managed Care, Other (non HMO) | Admitting: Cardiology

## 2022-08-26 VITALS — BP 142/88 | HR 82 | Ht 64.5 in | Wt 174.9 lb

## 2022-08-26 DIAGNOSIS — I10 Essential (primary) hypertension: Secondary | ICD-10-CM | POA: Diagnosis not present

## 2022-08-26 DIAGNOSIS — R002 Palpitations: Secondary | ICD-10-CM | POA: Diagnosis not present

## 2022-08-26 MED ORDER — HYDROCHLOROTHIAZIDE 12.5 MG PO CAPS
12.5000 mg | ORAL_CAPSULE | Freq: Every day | ORAL | 1 refills | Status: DC
  Filled 2022-08-26: qty 30, 30d supply, fill #0
  Filled 2022-09-30 – 2022-10-12 (×2): qty 30, 30d supply, fill #1
  Filled 2022-11-11 – 2023-01-09 (×4): qty 30, 30d supply, fill #2
  Filled 2023-02-07 – 2023-02-09 (×3): qty 30, 30d supply, fill #3

## 2022-08-26 NOTE — Patient Instructions (Addendum)
Medication Instructions:  Your physician has recommended you make the following change in your medication:  STOP: Nifedipine START: Hydrochlorothiazide (HCTZ) 12.5mg  daily    *If you need a refill on your cardiac medications before your next appointment, please call your pharmacy*  Please take your blood pressure daily for 2 weeks and send in a MyChart message. Please include heart rates.   HOW TO TAKE YOUR BLOOD PRESSURE: Rest 5 minutes before taking your blood pressure. Don't smoke or drink caffeinated beverages for at least 30 minutes before. Take your blood pressure before (not after) you eat. Sit comfortably with your back supported and both feet on the floor (don't cross your legs). Elevate your arm to heart level on a table or a desk. Use the proper sized cuff. It should fit smoothly and snugly around your bare upper arm. There should be enough room to slip a fingertip under the cuff. The bottom edge of the cuff should be 1 inch above the crease of the elbow. Ideally, take 3 measurements at one sitting and record the average.    Lab Work: NONE If you have labs (blood work) drawn today and your tests are completely normal, you will receive your results only by: [place] (if you have MyChart) OR A paper copy in the mail If you have any lab test that is abnormal or we need to change your treatment, we will call you to review the results.   Testing/Procedures:  [place] [place]- Long Term Monitor Instructions  Your physician has requested you wear a ZIO patch monitor for 14 days.  This is a single patch monitor. [employer] supplies one patch monitor per enrollment. Additional stickers are not available. Please do not apply patch if you will be having a Nuclear Stress Test,  Echocardiogram, Cardiac CT, MRI, or Chest Xray during the period you would be wearing the  monitor. The patch cannot be worn during these tests. You cannot remove and re-apply the  ZIO XT patch monitor.  Your ZIO  patch monitor will be mailed 3 day USPS to your address on file. It may take 3-5 days  to receive your monitor after you have been enrolled.  Once you have received your monitor, please review the enclosed instructions. Your monitor  has already been registered assigning a specific monitor serial # to you.  Billing and Patient Assistance Program Information  We have supplied [employer] with any of your insurance information on file for billing purposes. [employer] offers a sliding scale Patient Assistance Program for patients that do not have  insurance, or whose insurance does not completely cover the cost of the ZIO monitor.  You must apply for the Patient Assistance Program to qualify for this discounted rate.  To apply, please call [employer] at [phone], select option 4, select option 2, ask to apply for  Patient Assistance Program. Theodore Demark will ask your household income, and how many people  are in your household. They will quote your out-of-pocket cost based on that information.  [employer] will also be able to set up a 47-month, interest-free payment plan if needed.  Applying the monitor   Shave hair from upper left chest.  Hold abrader disc by orange tab. Rub abrader in 40 strokes over the upper left chest as  indicated in your monitor instructions.  Clean area with 4 enclosed alcohol pads. Let dry.  Apply patch as indicated in monitor instructions. Patch will be placed under collarbone on left  side of chest with arrow pointing upward.  Rub patch adhesive wings for 2 minutes. Remove white label marked "1". Remove the white  label marked "2". Rub patch adhesive wings for 2 additional minutes.  While looking in a mirror, press and release button in center of patch. A small green light will  flash 3-4 times. This will be your only indicator that the monitor has been turned on.  Do not shower for the first 24 hours. You may shower after the first 24 hours.  Press the button if you feel a  symptom. You will hear a small click. Record Date, Time and  Symptom in the Patient Logbook.  When you are ready to remove the patch, follow instructions on the last 2 pages of Patient  Logbook. Stick patch monitor onto the last page of Patient Logbook.  Place Patient Logbook in the blue and white box. Use locking tab on box and tape box closed  securely. The blue and white box has prepaid postage on it. Please place it in the mailbox as  soon as possible. Your physician should have your test results approximately 7 days after the  monitor has been mailed back to [facility].  Call [facility] Customer Care at [phone] if you have questions regarding  your ZIO XT patch monitor. Call them immediately if you see an orange light blinking on your  monitor.  If your monitor falls off in less than 4 days, contact our Monitor department at [phone].  If your monitor becomes loose or falls off after 4 days call [employer] at [phone] for  suggestions on securing your monitor    Follow-Up: At [facility], you and your health needs are our priority.  As part of our continuing mission to provide you with exceptional heart care, we have created designated Provider Care Teams.  These Care Teams include your primary Cardiologist (physician) and Advanced Practice Providers (APPs -  Physician Assistants and Nurse Practitioners) who all work together to provide you with the care you need, when you need it.  We recommend signing up for the patient portal called "MyChart".  Sign up information is provided on this After Visit Summary.  MyChart is used to connect with patients for Virtual Visits (Telemedicine).  Patients are able to view lab/test results, encounter notes, upcoming appointments, etc.  Non-urgent messages can be sent to your provider as well.   To learn more about what you can do with MyChart, go to [email or web address].    Your next appointment:   4 month(s) at  [facility]   The format for your next appointment:   In Person  Provider:   Thomasene Ripple, DO

## 2022-08-26 NOTE — Progress Notes (Unsigned)
Cardio-Obstetrics Clinic  New Evaluation  Date:  08/29/2022   ID:  Kathy Bird, DOB Jul 16, 1986, MRN [id number]  PCP:  Venora Maples, MD   [facility] Providers Cardiologist:  Thomasene Ripple, DO  Electrophysiologist:  None       Referring MD: Levonne Lapping, NP   Chief Complaint: " I am ok"  History of Present Illness:    Kathy Bird is a 36 y.o. female [G4P2022] who is being seen today for the evaluation of postpartum hypertension at the request of Levonne Lapping, NP.   She was referred by the [facility] team due to elevated blood pressure.  Postop visit she had been started on antihypertensive medication unfortunately she has not been taking this medicine.  She tells me that she has been experiencing intermittent palpitations.  She describes this as abrupt onset of fast heart rate which goes for minutes at a time.  She has not passed out.  But she concerned that this is frequent.  She is in office with her significant other.   Prior CV Studies Reviewed: The following studies were reviewed today: Transthoracic echocardiogram June 28, 2022 IMPRESSIONS     1. Left ventricular ejection fraction, by estimation, is 60 to 65%. The  left ventricle has normal function. The left ventricle has no regional  wall motion abnormalities. Left ventricular diastolic parameters are  indeterminate.   2. Right ventricular systolic function is normal. The right ventricular  size is normal. Tricuspid regurgitation signal is inadequate for assessing  PA pressure.   3. The mitral valve is normal in structure. No evidence of mitral valve  regurgitation.   4. The aortic valve is tricuspid. Aortic valve regurgitation is not  visualized.   5. The inferior vena cava is normal in size with greater than 50%  respiratory variability, suggesting right atrial pressure of 3 mmHg.   Comparison(s): No prior Echocardiogram.   Conclusion(s)/Recommendation(s): Normal biventricular function without   evidence of hemodynamically significant valvular heart disease.   FINDINGS   Left Ventricle: Left ventricular ejection fraction, by estimation, is 60  to 65%. The left ventricle has normal function. The left ventricle has no  regional wall motion abnormalities. The left ventricular internal cavity  size was normal in size. There is   no left ventricular hypertrophy. Left ventricular diastolic parameters  are indeterminate.   Right Ventricle: The right ventricular size is normal. Right ventricular  systolic function is normal. Tricuspid regurgitation signal is inadequate  for assessing PA pressure.   Left Atrium: Left atrial size was normal in size.   Right Atrium: Right atrial size was normal in size.   Pericardium: There is no evidence of pericardial effusion.   Mitral Valve: The mitral valve is normal in structure. No evidence of  mitral valve regurgitation.   Tricuspid Valve: The tricuspid valve is normal in structure. Tricuspid  valve regurgitation is not demonstrated.   Aortic Valve: The aortic valve is tricuspid. Aortic valve regurgitation is  not visualized.   Pulmonic Valve: The pulmonic valve was grossly normal. Pulmonic valve  regurgitation is not visualized.   Venous: The inferior vena cava is normal in size with greater than 50%  respiratory variability, suggesting right atrial pressure of 3 mmHg.   IAS/Shunts: The interatrial septum was not well visualized.   Past Medical History:  Diagnosis Date   Anxiety    Bipolar disorder (HCC)    Depression    Pregnancy induced hypertension    Schizophrenia (HCC)  Seizures (HCC)    as child. last seizure when 40yrs old    Past Surgical History:  Procedure Laterality Date   ADENOIDECTOMY        OB History     Gravida  4   Para  2   Term  2   Preterm  0   AB  2   Living  2      SAB  0   IAB  2   Ectopic  0   Multiple  0   Live Births  2        Obstetric Comments  Pre-eclampsia  with  delivery             Current Medications: Current Meds  Medication Sig   acetaminophen (TYLENOL) 500 MG tablet Take 2 tablets (1,000 mg total) by mouth every 6 (six) hours.   Buprenorphine HCl-Naloxone HCl (SUBOXONE) 2-0.5 MG FILM Place 1 Film under the tongue 4 (four) times daily.   hydrochlorothiazide (MICROZIDE) 12.5 MG capsule Take 1 capsule (12.5 mg total) by mouth daily.   hydrOXYzine (VISTARIL) 25 MG capsule Take 1 capsule (25 mg total) by mouth 3 (three) times daily as needed for anxiety.   ibuprofen (ADVIL) 200 MG tablet Take 400 mg by mouth every 6 (six) hours as needed.   naloxone (NARCAN) nasal spray 4 mg/0.1 mL Use as needed to reverse opioid overdose   sertraline (ZOLOFT) 50 MG tablet Take 1 tablet (50 mg total) by mouth daily.     Allergies:   Peanut-containing drug products and Shellfish allergy   Social History   Socioeconomic History   Marital status: Divorced    Spouse name: Not on file   Number of children: Not on file   Years of education: Not on file   Highest education level: Not on file  Occupational History   Not on file  Tobacco Use   Smoking status: Former    Years: 9.00    Types: Cigarettes    Quit date: 11/18/2021    Years since quitting: 0.7   Smokeless tobacco: Never  Vaping Use   Vaping Use: Never used  Substance and Sexual Activity   Alcohol use: Not Currently    Alcohol/week: 3.0 standard drinks of alcohol    Types: 3 Cans of beer per week    Comment: Social   Drug use: Not Currently    Types: Other-see comments    Comment: No Rx drugs since 11/18/2021   Sexual activity: Yes    Birth control/protection: None  Other Topics Concern   Not on file  Social History Narrative   Not on file   Social Determinants of Health   Financial Resource Strain: Not on file  Food Insecurity: No Food Insecurity (04/05/2022)   Hunger Vital Sign    Worried About Running Out of Food in the Last Year: Never true    Ran Out of Food in the Last Year:  Never true  Transportation Needs: No Transportation Needs (04/05/2022)   PRAPARE - Administrator, Civil Service (Medical): No    Lack of Transportation (Non-Medical): No  Physical Activity: Not on file  Stress: Not on file  Social Connections: Not on file      Family History  Problem Relation Age of Onset   Cancer Mother    Healthy Mother    Vision loss Father       ROS:   Please see the history of present illness.    Palpitations  and All other systems reviewed and are negative.   Labs/EKG Reviewed:    EKG:   EKG is was not ordered today.  The ekg ordered today demonstrates   Recent Labs: 05/04/2022: ALT 29; BUN 9; Creatinine, Ser 0.74; Hemoglobin 12.3; Platelets 267; Potassium 3.7; Sodium 140 08/09/2022: TSH 1.290   Recent Lipid Panel No results found for: "CHOL", "TRIG", "HDL", "CHOLHDL", "LDLCALC", "LDLDIRECT"  Physical Exam:    VS:  BP (!) 142/88 (BP Location: Right Arm, Patient Position: Sitting)   Pulse 82   Ht 5' 4.5" (1.638 m)   Wt 174 lb 14.4 oz (79.3 kg)   SpO2 98%   BMI 29.56 kg/m     Wt Readings from Last 3 Encounters:  08/26/22 174 lb 14.4 oz (79.3 kg)  05/26/22 154 lb 8 oz (70.1 kg)  05/12/22 143 lb 8 oz (65.1 kg)     GEN: Well nourished, well developed in no acute distress HEENT: Normal NECK: No JVD; No carotid bruits LYMPHATICS: No lymphadenopathy CARDIAC: RRR, no murmurs, rubs, gallops RESPIRATORY:  Clear to auscultation without rales, wheezing or rhonchi  ABDOMEN: Soft, non-tender, non-distended MUSCULOSKELETAL:  No edema; No deformity  SKIN: Warm and dry NEUROLOGIC:  Alert and oriented x 3 PSYCHIATRIC:  Normal affect    Risk Assessment/Risk Calculators:                 ASSESSMENT & PLAN:    Accelerated hypertension Palpitation  She is not breast-feeding.  She also does have significant bilateral leg swelling which likely is from the nifedipine.  I will start the patient on hydrochlorothiazide 12.5 mg daily.  Of  asked the patient take her blood pressure daily and send me this information. In terms of her palpitation we will place a ZIO monitor on the patient to make sure that she is not experiencing any arrhythmia.   Patient Instructions  Medication Instructions:  Your physician has recommended you make the following change in your medication:  STOP: Nifedipine START: Hydrochlorothiazide (HCTZ) 12.5mg  daily    *If you need a refill on your cardiac medications before your next appointment, please call your pharmacy*  Please take your blood pressure daily for 2 weeks and send in a MyChart message. Please include heart rates.   HOW TO TAKE YOUR BLOOD PRESSURE: Rest 5 minutes before taking your blood pressure. Don't smoke or drink caffeinated beverages for at least 30 minutes before. Take your blood pressure before (not after) you eat. Sit comfortably with your back supported and both feet on the floor (don't cross your legs). Elevate your arm to heart level on a table or a desk. Use the proper sized cuff. It should fit smoothly and snugly around your bare upper arm. There should be enough room to slip a fingertip under the cuff. The bottom edge of the cuff should be 1 inch above the crease of the elbow. Ideally, take 3 measurements at one sitting and record the average.    Lab Work: NONE If you have labs (blood work) drawn today and your tests are completely normal, you will receive your results only by: MyChart Message (if you have MyChart) OR A paper copy in the mail If you have any lab test that is abnormal or we need to change your treatment, we will call you to review the results.   Testing/Procedures:  Christena Deem- Long Term Monitor Instructions  Your physician has requested you wear a ZIO patch monitor for 14 days.  This is a single patch  monitor. [employer] supplies one patch monitor per enrollment. Additional stickers are not available. Please do not apply patch if you will be having a  Nuclear Stress Test,  Echocardiogram, Cardiac CT, MRI, or Chest Xray during the period you would be wearing the  monitor. The patch cannot be worn during these tests. You cannot remove and re-apply the  ZIO XT patch monitor.  Your ZIO patch monitor will be mailed 3 day USPS to your address on file. It may take 3-5 days  to receive your monitor after you have been enrolled.  Once you have received your monitor, please review the enclosed instructions. Your monitor  has already been registered assigning a specific monitor serial # to you.  Billing and Patient Assistance Program Information  We have supplied [employer] with any of your insurance information on file for billing purposes. [employer] offers a sliding scale Patient Assistance Program for patients that do not have  insurance, or whose insurance does not completely cover the cost of the ZIO monitor.  You must apply for the Patient Assistance Program to qualify for this discounted rate.  To apply, please call [employer] at [phone], select option 4, select option 2, ask to apply for  Patient Assistance Program. Theodore Demark will ask your household income, and how many people  are in your household. They will quote your out-of-pocket cost based on that information.  [employer] will also be able to set up a 13-month, interest-free payment plan if needed.  Applying the monitor   Shave hair from upper left chest.  Hold abrader disc by orange tab. Rub abrader in 40 strokes over the upper left chest as  indicated in your monitor instructions.  Clean area with 4 enclosed alcohol pads. Let dry.  Apply patch as indicated in monitor instructions. Patch will be placed under collarbone on left  side of chest with arrow pointing upward.  Rub patch adhesive wings for 2 minutes. Remove white label marked "1". Remove the white  label marked "2". Rub patch adhesive wings for 2 additional minutes.  While looking in a mirror, press and release button in center  of patch. A small green light will  flash 3-4 times. This will be your only indicator that the monitor has been turned on.  Do not shower for the first 24 hours. You may shower after the first 24 hours.  Press the button if you feel a symptom. You will hear a small click. Record Date, Time and  Symptom in the Patient Logbook.  When you are ready to remove the patch, follow instructions on the last 2 pages of Patient  Logbook. Stick patch monitor onto the last page of Patient Logbook.  Place Patient Logbook in the blue and white box. Use locking tab on box and tape box closed  securely. The blue and white box has prepaid postage on it. Please place it in the mailbox as  soon as possible. Your physician should have your test results approximately 7 days after the  monitor has been mailed back to [facility].  Call [employer] at [phone] if you have questions regarding  your ZIO XT patch monitor. Call them immediately if you see an orange light blinking on your  monitor.  If your monitor falls off in less than 4 days, contact our Monitor department at [phone].  If your monitor becomes loose or falls off after 4 days call [employer] at [phone] for  suggestions on securing your monitor    Follow-Up: At  [facility], you and your health needs are our priority.  As part of our continuing mission to provide you with exceptional heart care, we have created designated Provider Care Teams.  These Care Teams include your primary Cardiologist (physician) and Advanced Practice Providers (APPs -  Physician Assistants and Nurse Practitioners) who all work together to provide you with the care you need, when you need it.  We recommend signing up for the patient portal called "MyChart".  Sign up information is provided on this After Visit Summary.  MyChart is used to connect with patients for Virtual Visits (Telemedicine).  Patients are able to view lab/test results,  encounter notes, upcoming appointments, etc.  Non-urgent messages can be sent to your provider as well.   To learn more about what you can do with MyChart, go to [email or web address].    Your next [facility]   [facility]   The format for your next appointment:   In Person  Provider:   Thomasene RippleKardie Maxmilian Trostel, DO      Dispo:  Return in about 4 months (around 12/27/2022).   Medication Adjustments/Labs and Tests Ordered: Current medicines are reviewed at length with the patient today.  Concerns regarding medicines are outlined above.  Tests Ordered: Orders Placed This Encounter  Procedures   LONG TERM MONITOR (3-14 DAYS)   Medication Changes: Meds ordered this encounter  Medications   hydrochlorothiazide (MICROZIDE) 12.5 MG capsule    Sig: Take 1 capsule (12.5 mg total) by mouth daily.    Dispense:  90 capsule    Refill:  1

## 2022-08-29 ENCOUNTER — Encounter: Payer: Self-pay | Admitting: Cardiology

## 2022-08-29 ENCOUNTER — Ambulatory Visit: Payer: Managed Care, Other (non HMO) | Attending: Cardiology

## 2022-08-29 ENCOUNTER — Other Ambulatory Visit: Payer: Self-pay

## 2022-08-29 DIAGNOSIS — R002 Palpitations: Secondary | ICD-10-CM

## 2022-08-29 NOTE — Progress Notes (Unsigned)
Enrolled for [employer] to mail a ZIO XT long term holter monitor to the patients address on file.  

## 2022-09-01 ENCOUNTER — Other Ambulatory Visit: Payer: Self-pay

## 2022-09-04 ENCOUNTER — Other Ambulatory Visit: Payer: Self-pay | Admitting: Family Medicine

## 2022-09-04 DIAGNOSIS — F1110 Opioid abuse, uncomplicated: Secondary | ICD-10-CM

## 2022-09-06 ENCOUNTER — Other Ambulatory Visit: Payer: Self-pay | Admitting: Family Medicine

## 2022-09-06 ENCOUNTER — Other Ambulatory Visit: Payer: Self-pay

## 2022-09-06 DIAGNOSIS — F1110 Opioid abuse, uncomplicated: Secondary | ICD-10-CM

## 2022-09-06 MED ORDER — BUPRENORPHINE HCL-NALOXONE HCL 2-0.5 MG SL FILM
2.0000 | ORAL_FILM | Freq: Three times a day (TID) | SUBLINGUAL | 0 refills | Status: DC
  Filled 2022-09-06 (×2): qty 180, 30d supply, fill #0
  Filled 2022-09-06 – 2022-09-07 (×2): qty 180, fill #0
  Filled ????-??-??: fill #0

## 2022-09-06 NOTE — Progress Notes (Signed)
Suboxone refill, rx adjustment

## 2022-09-07 ENCOUNTER — Other Ambulatory Visit: Payer: Self-pay

## 2022-09-08 ENCOUNTER — Other Ambulatory Visit: Payer: Self-pay | Admitting: Family Medicine

## 2022-09-08 ENCOUNTER — Other Ambulatory Visit (HOSPITAL_COMMUNITY): Payer: Self-pay

## 2022-09-08 ENCOUNTER — Other Ambulatory Visit: Payer: Self-pay

## 2022-09-08 DIAGNOSIS — F1110 Opioid abuse, uncomplicated: Secondary | ICD-10-CM

## 2022-09-08 MED ORDER — BUPRENORPHINE HCL-NALOXONE HCL 4-1 MG SL FILM
1.0000 | ORAL_FILM | Freq: Three times a day (TID) | SUBLINGUAL | 0 refills | Status: DC
  Filled 2022-09-08: qty 30, 10d supply, fill #0

## 2022-09-08 MED ORDER — BUPRENORPHINE HCL-NALOXONE HCL 4-1 MG SL FILM
1.0000 | ORAL_FILM | Freq: Three times a day (TID) | SUBLINGUAL | 0 refills | Status: DC
  Filled 2022-09-08: qty 90, fill #0

## 2022-09-08 NOTE — Progress Notes (Signed)
Contacted by patient that she is out of suboxone. Contacted pharmacy, they do not have stock of 2 mg films, however they noted it is available at [facility] outpatient pharmacy. New rx sent for 4 mg films TID (had been taking 2 mg TID), along with note that its ok to fill early and also to do partial fill if necessary. Will notify patient by mychart.

## 2022-09-13 ENCOUNTER — Encounter: Payer: Self-pay | Admitting: Family Medicine

## 2022-09-13 ENCOUNTER — Other Ambulatory Visit (HOSPITAL_COMMUNITY): Payer: Self-pay

## 2022-09-13 ENCOUNTER — Ambulatory Visit: Payer: Managed Care, Other (non HMO) | Admitting: Family Medicine

## 2022-09-13 VITALS — BP 134/89 | HR 92 | Ht 64.5 in | Wt 176.1 lb

## 2022-09-13 DIAGNOSIS — Z23 Encounter for immunization: Secondary | ICD-10-CM | POA: Diagnosis not present

## 2022-09-13 DIAGNOSIS — F1110 Opioid abuse, uncomplicated: Secondary | ICD-10-CM | POA: Diagnosis not present

## 2022-09-13 DIAGNOSIS — Z0000 Encounter for general adult medical examination without abnormal findings: Secondary | ICD-10-CM | POA: Diagnosis not present

## 2022-09-13 DIAGNOSIS — F1191 Opioid use, unspecified, in remission: Secondary | ICD-10-CM | POA: Diagnosis not present

## 2022-09-13 DIAGNOSIS — F332 Major depressive disorder, recurrent severe without psychotic features: Secondary | ICD-10-CM

## 2022-09-13 MED ORDER — BUPRENORPHINE HCL-NALOXONE HCL 4-1 MG SL FILM
1.0000 | ORAL_FILM | Freq: Three times a day (TID) | SUBLINGUAL | 0 refills | Status: DC
  Filled 2022-09-13: qty 90, 30d supply, fill #0
  Filled 2022-09-13: qty 90, fill #0
  Filled 2022-09-14 – 2022-09-16 (×2): qty 90, 30d supply, fill #0
  Filled 2022-09-17: qty 60, 20d supply, fill #0
  Filled 2022-10-05: qty 30, 10d supply, fill #1

## 2022-09-13 NOTE — Assessment & Plan Note (Signed)
Doing well on increased dose with symptoms of OUD well controlled. Continue current dose. UDS collected today.

## 2022-09-13 NOTE — Progress Notes (Signed)
   GYNECOLOGY OFFICE VISIT NOTE  History:   Kathy Bird is a 36 y.o. [id number] here today for OUD follow up.  Last seen 08/09/2022, assumed suboxone prescribing at that time and increased to 2 mg QID Subsequently increased to 4 mg TID (12mg  daily total) a few days prior  Today reports dose increase is going well Does not feel too sedated Does not have cravings  Health Maintenance Due  Topic Date Due   COVID-19 Vaccine (1) Never done    Past Medical History:  Diagnosis Date   Anxiety    Bipolar disorder (HCC)    Depression    Pregnancy induced hypertension    Schizophrenia (HCC)    Seizures (HCC)    as child. last seizure when 78yrs old    Past Surgical History:  Procedure Laterality Date   ADENOIDECTOMY      The following portions of the patient's history were reviewed and updated as appropriate: allergies, current medications, past family history, past medical history, past social history, past surgical history and problem list.   Health Maintenance:   Last pap: Lab Results  Component Value Date   DIAGPAP  05/26/2022    - Negative for Intraepithelial Lesions or Malignancy (NILM)   DIAGPAP - Benign reactive/reparative changes 05/26/2022   HPVHIGH Negative 05/26/2022    Last mammogram:  N/a    Review of Systems:  Pertinent items noted in HPI and remainder of comprehensive ROS otherwise negative.  Physical Exam:  BP 134/89   Pulse 92   Ht 5' 4.5" (1.638 m)   Wt 176 lb 1.6 oz (79.9 kg)   BMI 29.76 kg/m  CONSTITUTIONAL: Well-developed, well-nourished female in no acute distress.  HEENT:  Normocephalic, atraumatic. External right and left ear normal. No scleral icterus.  NECK: Normal range of motion, supple, no masses noted on observation SKIN: No rash noted. Not diaphoretic. No erythema. No pallor. MUSCULOSKELETAL: Normal range of motion. No edema noted. NEUROLOGIC: Alert and oriented to person, place, and time. Normal muscle tone coordination.   PSYCHIATRIC: Normal mood and affect. Normal behavior. Normal judgment and thought content. RESPIRATORY: Effort normal, no problems with respiration noted   Labs and Imaging No results found for this or any previous visit (from the past 168 hour(s)). No results found.    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 Continue current dose. UDS collected today.       Relevant Medications   Buprenorphine HCl-Naloxone HCl (SUBOXONE) 4-1 MG FILM   Other Visit Diagnoses     Well female exam without gynecological exam       Relevant Orders   Flu Vaccine QUAD 28mo+IM (Fluarix, Fluzone & Alfiuria Quad PF) (Completed)       Routine preventative health maintenance measures emphasized. Please refer to After Visit Summary for other counseling recommendations.   Return in 4 weeks (on 10/11/2022) for [facility] patient, OUD f/u.    Total face-to-face time with patient: 15 minutes.  Over 50% of encounter was spent on counseling and coordination of care.   14/09/2022, MD/MPH Attending Family Medicine Physician, [facility] for [facility], [facility]

## 2022-09-14 ENCOUNTER — Other Ambulatory Visit (HOSPITAL_COMMUNITY): Payer: Self-pay

## 2022-09-16 ENCOUNTER — Other Ambulatory Visit (HOSPITAL_COMMUNITY): Payer: Self-pay

## 2022-09-16 LAB — TOXASSURE SELECT 13 (MW), URINE

## 2022-09-17 ENCOUNTER — Other Ambulatory Visit (HOSPITAL_COMMUNITY): Payer: Self-pay

## 2022-09-27 ENCOUNTER — Other Ambulatory Visit: Payer: Self-pay | Admitting: Family Medicine

## 2022-09-27 DIAGNOSIS — F332 Major depressive disorder, recurrent severe without psychotic features: Secondary | ICD-10-CM

## 2022-09-28 ENCOUNTER — Other Ambulatory Visit: Payer: Self-pay

## 2022-09-28 MED ORDER — SERTRALINE HCL 50 MG PO TABS
50.0000 mg | ORAL_TABLET | Freq: Every day | ORAL | 3 refills | Status: DC
  Filled 2022-09-28 – 2022-09-30 (×2): qty 30, 30d supply, fill #0

## 2022-09-30 ENCOUNTER — Other Ambulatory Visit: Payer: Self-pay

## 2022-10-05 ENCOUNTER — Other Ambulatory Visit (HOSPITAL_COMMUNITY): Payer: Self-pay

## 2022-10-06 ENCOUNTER — Encounter: Payer: Self-pay | Admitting: *Deleted

## 2022-10-06 ENCOUNTER — Other Ambulatory Visit: Payer: Self-pay

## 2022-10-12 ENCOUNTER — Other Ambulatory Visit: Payer: Self-pay

## 2022-10-12 ENCOUNTER — Other Ambulatory Visit: Payer: Self-pay | Admitting: Family Medicine

## 2022-10-12 DIAGNOSIS — F1110 Opioid abuse, uncomplicated: Secondary | ICD-10-CM

## 2022-10-14 ENCOUNTER — Other Ambulatory Visit: Payer: Self-pay

## 2022-10-14 MED ORDER — BUPRENORPHINE HCL-NALOXONE HCL 4-1 MG SL FILM
1.0000 | ORAL_FILM | Freq: Three times a day (TID) | SUBLINGUAL | 0 refills | Status: DC
  Filled 2022-10-14: qty 30, 10d supply, fill #0
  Filled 2022-10-17: qty 60, 20d supply, fill #0

## 2022-10-14 NOTE — Addendum Note (Signed)
Addended by: Merian Capron on: 10/14/2022 12:41 PM   Modules accepted: Orders

## 2022-10-15 ENCOUNTER — Other Ambulatory Visit (HOSPITAL_COMMUNITY): Payer: Self-pay

## 2022-10-17 ENCOUNTER — Other Ambulatory Visit: Payer: Self-pay

## 2022-10-17 ENCOUNTER — Ambulatory Visit: Payer: Managed Care, Other (non HMO) | Admitting: General Practice

## 2022-10-17 DIAGNOSIS — F1191 Opioid use, unspecified, in remission: Secondary | ICD-10-CM

## 2022-10-17 MED ORDER — SERTRALINE HCL 50 MG PO TABS: 50.0000 mg | ORAL_TABLET | Freq: Every day | ORAL | 3 refills | Status: DC

## 2022-10-17 NOTE — Progress Notes (Signed)
Patient came by office and dropped off urine sample for drug screening. Patient was not seen by nursing staff.   Chase Caller RN BSN 10/17/22

## 2022-10-17 NOTE — Addendum Note (Signed)
Addended by: Merian Capron on: 10/17/2022 04:11 PM   Modules accepted: Orders

## 2022-10-20 ENCOUNTER — Other Ambulatory Visit: Payer: Self-pay

## 2022-11-02 ENCOUNTER — Other Ambulatory Visit: Payer: Managed Care, Other (non HMO)

## 2022-11-02 DIAGNOSIS — F1191 Opioid use, unspecified, in remission: Secondary | ICD-10-CM

## 2022-11-04 LAB — TOXASSURE SELECT 13 (MW), URINE

## 2022-11-07 DIAGNOSIS — F1110 Opioid abuse, uncomplicated: Secondary | ICD-10-CM

## 2022-11-07 IMAGING — US US MFM OB FOLLOW-UP
1 series · 14 of 28 positions shown · non-contrast
Comparison: none

[Series 1: us mfm ob follow-up · 14 of 35 slices shown]
[im 2/35]
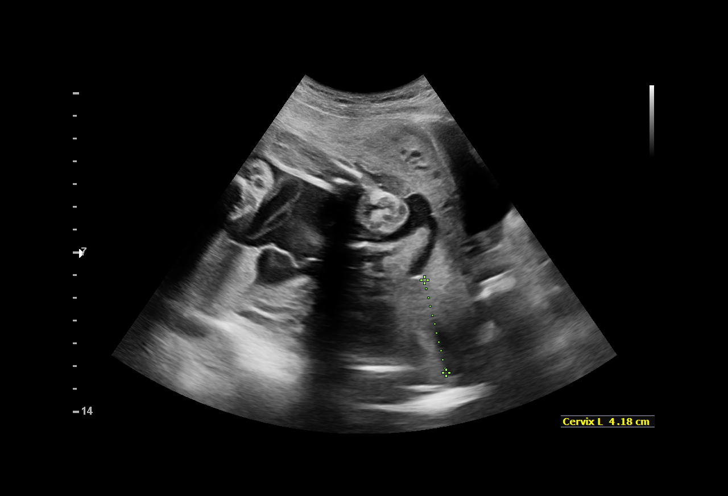
[im 4/35]
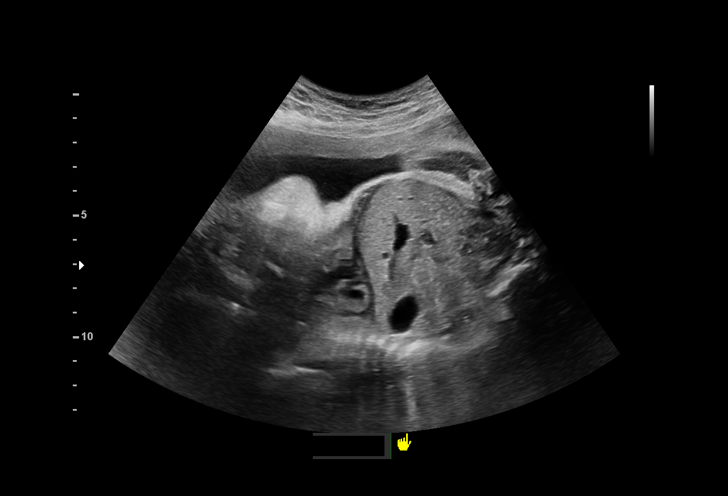
[im 7/35]
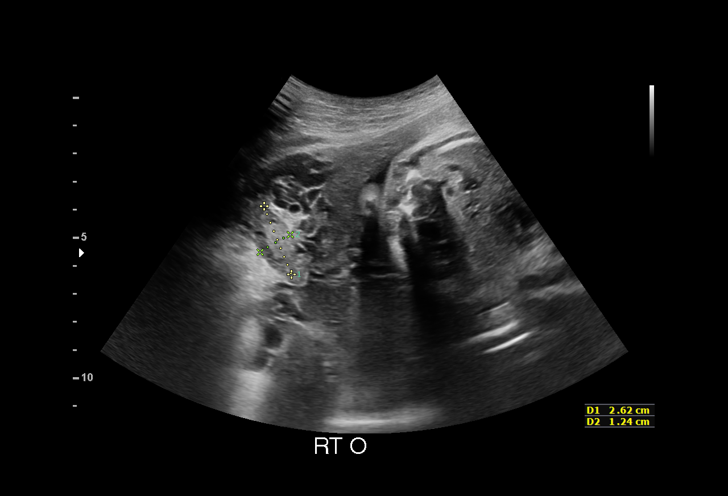
[im 9/35]
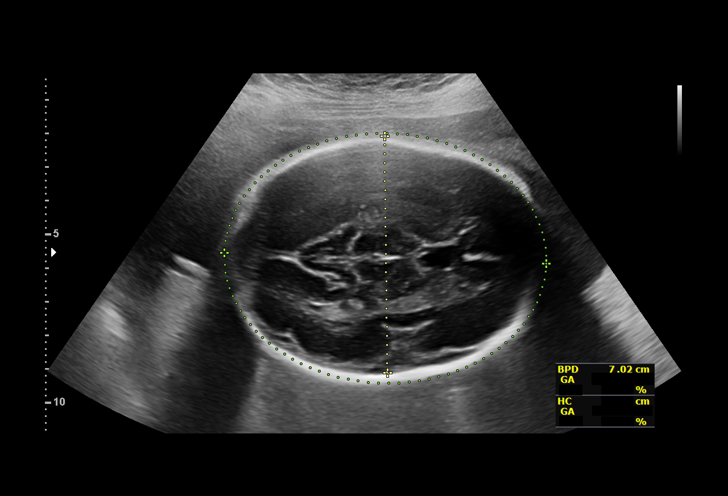
[im 12/35]
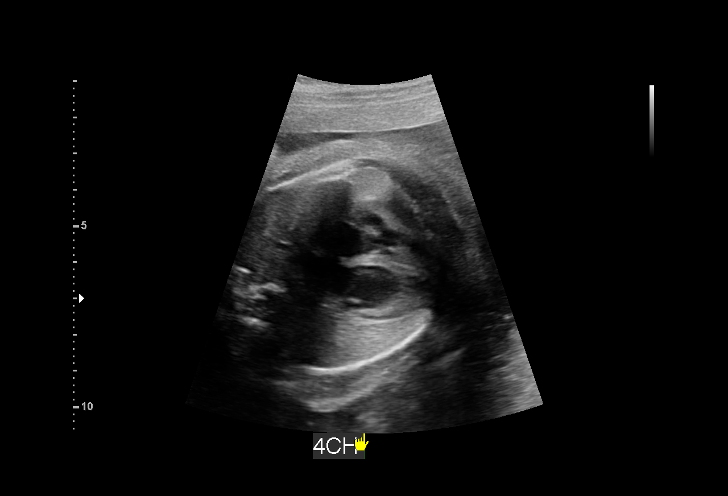
[im 14/35]
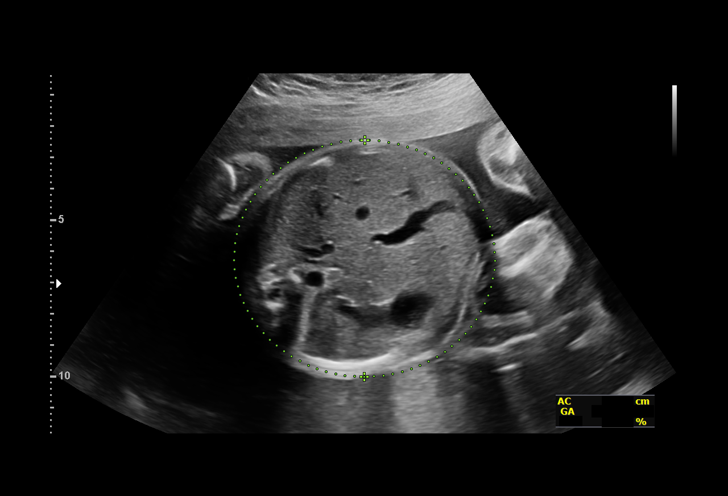
[im 17/35]
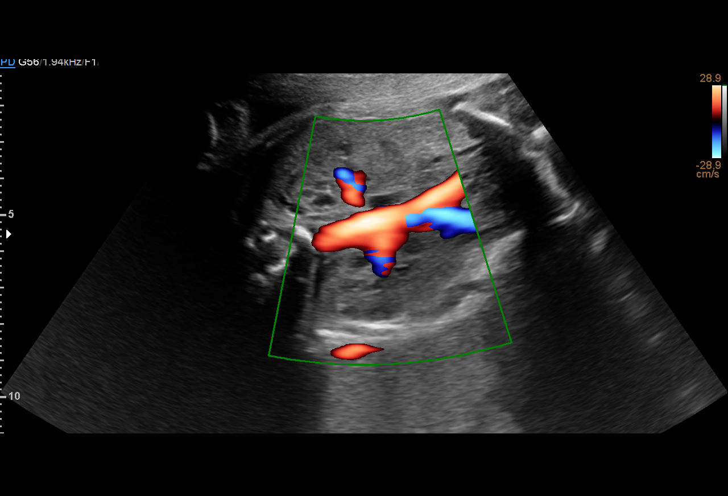
[im 19/35]
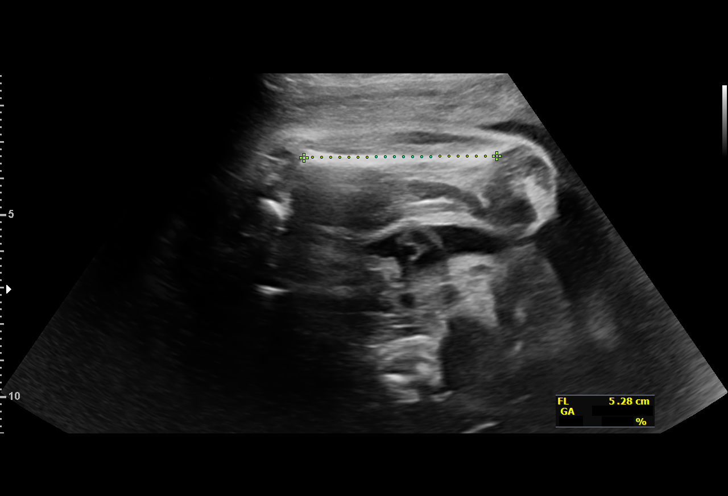
[im 22/35]
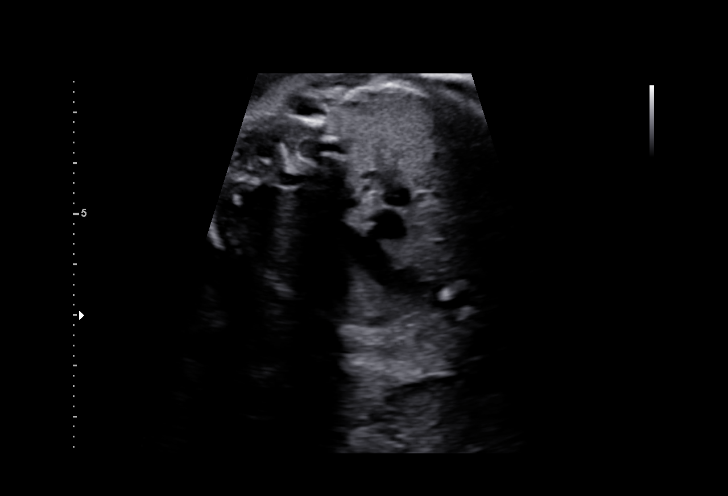
[im 24/35]
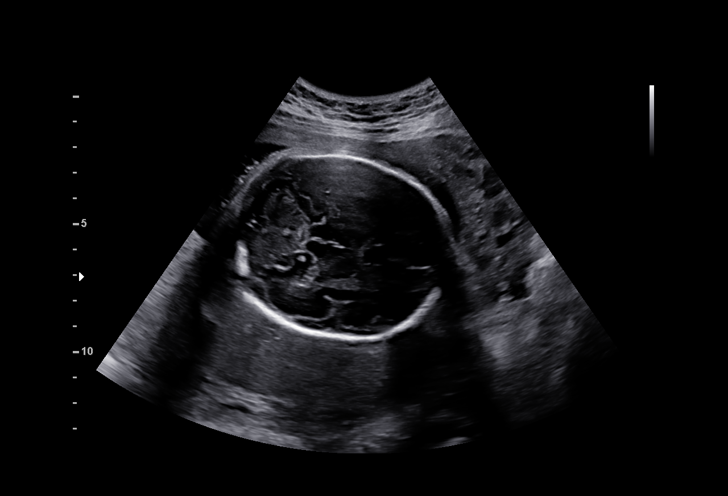
[im 27/35]
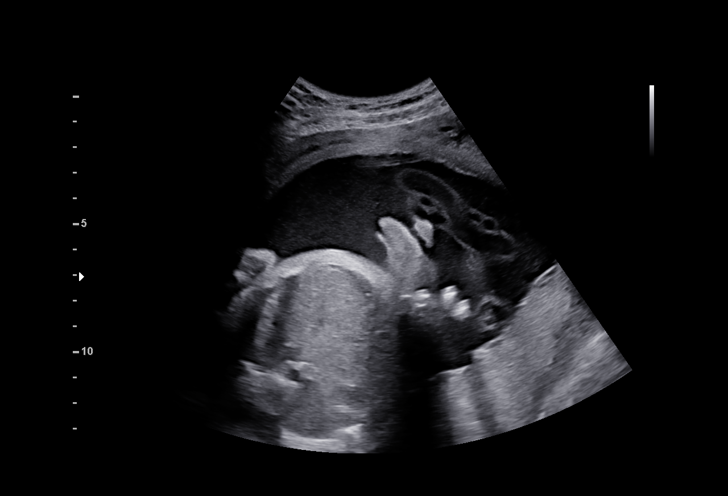
[im 29/35]
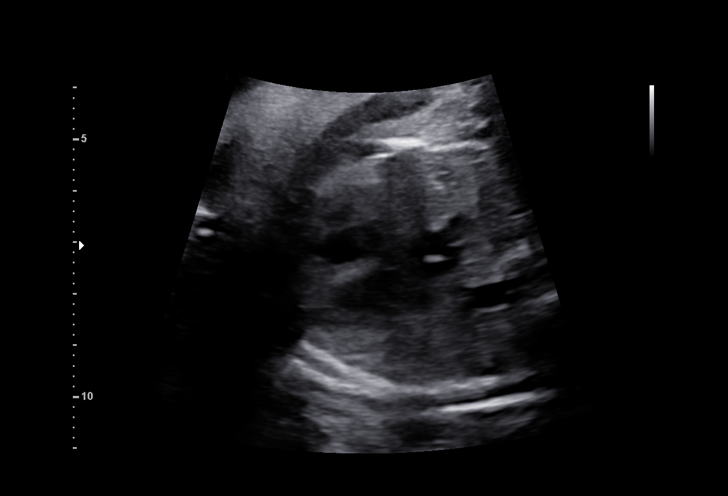
[im 32/35]
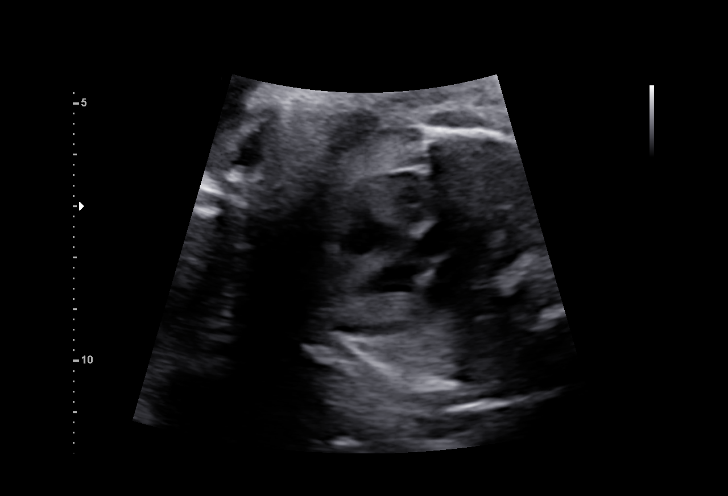
[im 35/35]
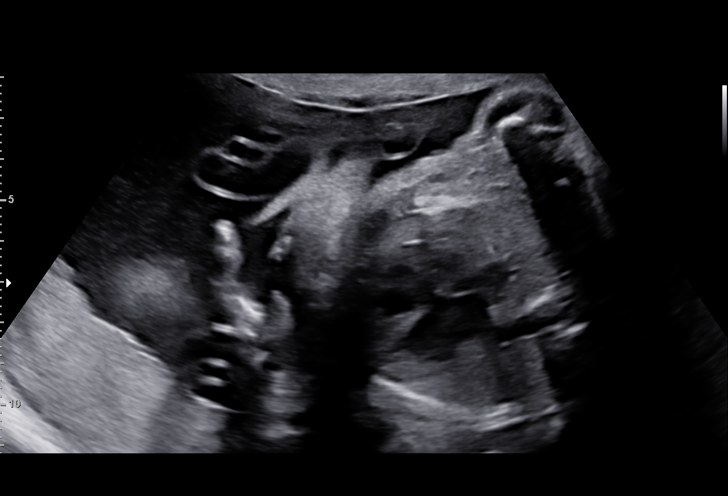

[14 of 28 positions shown; findings below may reference images not displayed]

[REDACTED]care
                   WALKER CNM

                                                      TSZ HUNG

Indications

 Drug use complicating pregnancy, second
 trimester (subutex)
 28 weeks gestation of pregnancy
 Poor obstetric history: Previous preeclampsia
 NIPS pending/Neg AFP
 Seizure disorder
Fetal Evaluation

 Num Of Fetuses:         1
 Fetal Heart Rate(bpm):  144
 Cardiac Activity:       Observed
 Presentation:           Breech
 Placenta:               Posterior
 P. Cord Insertion:      Previously Visualized

 Amniotic Fluid
 AFI FV:      Within normal limits

 AFI Sum(cm)     %Tile       Largest Pocket(cm)
 18.26           70

 RUQ(cm)       RLQ(cm)       LUQ(cm)        LLQ(cm)

Biometry
 BPD:      70.3  mm     G. Age:  28w 2d         41  %    CI:        69.36   %    70 - 86
                                                         FL/HC:      20.0   %    18.8 -
 HC:      269.5  mm     G. Age:  29w 3d         58  %    HC/AC:      1.07        1.05 -
 AC:      251.2  mm     G. Age:  29w 2d         78  %    FL/BPD:     76.7   %    71 - 87
 FL:       53.9  mm     G. Age:  28w 4d         47  %    FL/AC:      21.5   %    20 - 24

 LV:          2  mm

 Est. FW:    8281  gm    2 lb 15 oz      71  %
OB History

 Gravidity:    4         Term:   1        Prem:   0        SAB:   0
 TOP:          2       Ectopic:  0        Living: 1
Gestational Age

 LMP:           41w 5d        Date:  04/04/21                 EDD:   01/09/22
 U/S Today:     28w 6d                                        EDD:   04/09/22
 Best:          28w 1d     Det. By:  U/S  (11/22/21)          EDD:   04/14/22
Anatomy

 Cranium:               Appears normal         Aortic Arch:            Previously seen
 Cavum:                 Previously seen        Ductal Arch:            Previously seen
 Ventricles:            Appears normal         Diaphragm:              Previously seen
 Choroid Plexus:        Previously seen        Stomach:                Appears normal, left
                                                                       sided
 Cerebellum:            Previously seen        Abdomen:                Previously seen
 Posterior Fossa:       Previously seen        Abdominal Wall:         Previously seen
 Nuchal Fold:           Not applicable (>20    Cord Vessels:           Previously seen
                        wks GA)
 Face:                  Orbits and profile     Kidneys:                Appear normal
                        previously seen
 Lips:                  Previously seen        Bladder:                Appears normal
 Thoracic:              Previously seen        Spine:                  Previously seen
 Heart:                 Appears normal         Upper Extremities:      Previously seen
                        (4CH, axis, and
                        situs)
 RVOT:                  Not well visualized    Lower Extremities:      Previously seen
 LVOT:                  Not well visualized

 Other:  Male gender previously seen.
Cervix Uterus Adnexa

 Cervix
 Length:           4.18  cm.
 Not visualized (advanced GA >40wks)

 Right Ovary
 Visualized.

 Left Ovary
 Visualized.
Comments

 This patient was seen for a follow up growth scan due to
 treatment with Subutex.  She denies any problems since her
 last exam.
 She was informed that the fetal growth and amniotic fluid
 level appears appropriate for her gestational age.
 A follow-up growth scan was scheduled in 6 weeks.

## 2022-11-11 ENCOUNTER — Other Ambulatory Visit: Payer: Self-pay

## 2022-11-11 MED ORDER — BUPRENORPHINE HCL-NALOXONE HCL 4-1 MG SL FILM
1.0000 | ORAL_FILM | Freq: Four times a day (QID) | SUBLINGUAL | 0 refills | Status: DC
  Filled 2022-11-11 (×2): qty 28, 7d supply, fill #0

## 2022-11-15 ENCOUNTER — Ambulatory Visit (INDEPENDENT_AMBULATORY_CARE_PROVIDER_SITE_OTHER): Payer: Managed Care, Other (non HMO) | Admitting: Family Medicine

## 2022-11-15 ENCOUNTER — Encounter: Payer: Self-pay | Admitting: Family Medicine

## 2022-11-15 ENCOUNTER — Other Ambulatory Visit: Payer: Self-pay

## 2022-11-15 VITALS — BP 151/106 | HR 73 | Ht 64.5 in | Wt 184.3 lb

## 2022-11-15 DIAGNOSIS — F1110 Opioid abuse, uncomplicated: Secondary | ICD-10-CM | POA: Diagnosis not present

## 2022-11-15 DIAGNOSIS — I10 Essential (primary) hypertension: Secondary | ICD-10-CM

## 2022-11-15 DIAGNOSIS — F1191 Opioid use, unspecified, in remission: Secondary | ICD-10-CM

## 2022-11-15 MED ORDER — BUPRENORPHINE HCL-NALOXONE HCL 4-1 MG SL FILM
1.0000 | ORAL_FILM | Freq: Four times a day (QID) | SUBLINGUAL | 0 refills | Status: DC
  Filled 2022-11-15: qty 120, fill #0
  Filled 2022-11-16 – 2022-11-17 (×2): qty 120, 30d supply, fill #0

## 2022-11-15 MED ORDER — HYDROCHLOROTHIAZIDE 25 MG PO TABS
25.0000 mg | ORAL_TABLET | Freq: Every day | ORAL | 11 refills | Status: DC
  Filled 2022-11-15: qty 30, 30d supply, fill #0
  Filled 2022-12-13: qty 30, 30d supply, fill #1
  Filled 2023-01-09: qty 30, 30d supply, fill #2
  Filled 2023-02-07: qty 30, 30d supply, fill #3
  Filled 2023-04-05: qty 30, 30d supply, fill #4
  Filled 2023-05-02 – 2023-05-05 (×2): qty 30, 30d supply, fill #5
  Filled 2023-06-27: qty 30, 30d supply, fill #6
  Filled 2023-07-25: qty 30, 30d supply, fill #7
  Filled 2023-08-24: qty 30, 30d supply, fill #8
  Filled 2023-09-20 – 2023-11-13 (×3): qty 30, 30d supply, fill #9
  Filled ????-??-??: fill #4
  Filled ????-??-??: fill #9

## 2022-11-15 NOTE — Assessment & Plan Note (Signed)
Poorly controlled on HCTZ 12.5 mg, will increase to 25 mg daily, check BMP at next visit

## 2022-11-15 NOTE — Progress Notes (Signed)
GYNECOLOGY OFFICE VISIT NOTE  History:   Kathy Bird is a 37 y.o. [id number] here today for OUD follow up.  Patient has been struggling with increased cravings and wanting to increase from suboxone 4 mg TID to QID Last fill on 11/11/2022 for 7d supply of 4 mg QID Reports that she feels much better on QID 1 year sober date is coming up on 11/17/22, going to [employer] with the kids to celebrate   Health Maintenance Due  Topic Date Due   COVID-19 Vaccine (1) Never done    Past Medical History:  Diagnosis Date   Anxiety    Bipolar disorder ([place])    Depression    Pregnancy induced hypertension    Schizophrenia ([place])    Seizures ([place])    as child. last seizure when 52yrs old    Past Surgical History:  Procedure Laterality Date   ADENOIDECTOMY      The following portions of the patient's history were reviewed and updated as appropriate: allergies, current medications, past family history, past medical history, past social history, past surgical history and problem list.   Health Maintenance:   Last pap: Lab Results  Component Value Date   DIAGPAP  05/26/2022    - Negative for Intraepithelial Lesions or Malignancy (NILM)   DIAGPAP - Benign reactive/reparative changes 05/26/2022   HPVHIGH Negative 05/26/2022    Last mammogram:  N/a    Review of Systems:  Pertinent items noted in HPI and remainder of comprehensive ROS otherwise negative.  Physical Exam:  BP (!) 151/106   Pulse 73   Ht 5' 4.5" (1.638 m)   Wt 184 lb 4.8 oz (83.6 kg)   LMP  (LMP Unknown)   Breastfeeding No   BMI 31.15 kg/m  CONSTITUTIONAL: Well-developed, well-nourished female in no acute distress.  HEENT:  Normocephalic, atraumatic. External right and left ear normal. No scleral icterus.  NECK: Normal range of motion, supple, no masses noted on observation SKIN: No rash noted. Not diaphoretic. No erythema. No pallor. MUSCULOSKELETAL: Normal range of motion. No edema noted. NEUROLOGIC:  Alert and oriented to person, place, and time. Normal muscle tone coordination.  PSYCHIATRIC: Normal mood and affect. Normal behavior. Normal judgment and thought content. RESPIRATORY: Effort normal, no problems with respiration noted   Labs and Imaging No results found for this or any previous visit (from the past 168 hour(s)). No results found.    Assessment and Plan:   Problem List Items Addressed This Visit       Cardiovascular and Mediastinum   Essential hypertension    Poorly controlled on HCTZ 12.5 mg, will increase to 25 mg daily, check BMP at next visit      Relevant Medications   hydrochlorothiazide (HYDRODIURIL) 25 MG tablet     Other   Opioid use disorder in remission - Primary    Uds today Will increase to 4 mg QID, total of 16 mg daily, fairly average dose Discussed she could trial 8 mg BID for easier dosing, but if this works for her no problem Follow up in a month to see how she's doing, can space to 3 months at that time if doing well      Relevant Medications   Buprenorphine HCl-Naloxone HCl (SUBOXONE) 4-1 MG FILM   Other Relevant Orders   ToxASSURE Select 13 (MW), Urine   Other Visit Diagnoses     Opioid use disorder, mild, abuse (HCC)       Relevant Medications   Buprenorphine  HCl-Naloxone HCl (SUBOXONE) 4-1 MG FILM       Routine preventative health maintenance measures emphasized. Please refer to After Visit Summary for other counseling recommendations.   Return in about 4 weeks (around 12/13/2022) for suboxone follow up.    Total face-to-face time with patient: 15 minutes.  Over 50% of encounter was spent on counseling and coordination of care.   Clarnce Flock, MD/MPH Attending Family Medicine Physician, [facility] for [facility], [place]

## 2022-11-15 NOTE — Assessment & Plan Note (Signed)
Uds today Will increase to 4 mg QID, total of 16 mg daily, fairly average dose Discussed she could trial 8 mg BID for easier dosing, but if this works for her no problem Follow up in a month to see how she's doing, can space to 3 months at that time if doing well

## 2022-11-16 ENCOUNTER — Other Ambulatory Visit: Payer: Self-pay

## 2022-11-17 ENCOUNTER — Other Ambulatory Visit: Payer: Self-pay

## 2022-11-17 ENCOUNTER — Other Ambulatory Visit (HOSPITAL_COMMUNITY): Payer: Self-pay

## 2022-11-17 LAB — TOXASSURE SELECT 13 (MW), URINE

## 2022-11-18 ENCOUNTER — Other Ambulatory Visit: Payer: Self-pay

## 2022-11-18 ENCOUNTER — Other Ambulatory Visit: Payer: Self-pay | Admitting: Family Medicine

## 2022-11-18 MED ORDER — IBUPROFEN 200 MG PO TABS
400.0000 mg | ORAL_TABLET | Freq: Three times a day (TID) | ORAL | 0 refills | Status: DC | PRN
Start: 2022-11-18 — End: 2023-06-14
  Filled 2022-11-18: qty 100, 17d supply, fill #0

## 2022-11-25 ENCOUNTER — Other Ambulatory Visit: Payer: Self-pay

## 2022-12-13 ENCOUNTER — Other Ambulatory Visit: Payer: Self-pay | Admitting: Family Medicine

## 2022-12-13 ENCOUNTER — Other Ambulatory Visit: Payer: Self-pay

## 2022-12-13 ENCOUNTER — Encounter: Payer: Self-pay | Admitting: Family Medicine

## 2022-12-13 DIAGNOSIS — F1110 Opioid abuse, uncomplicated: Secondary | ICD-10-CM

## 2022-12-13 DIAGNOSIS — F1191 Opioid use, unspecified, in remission: Secondary | ICD-10-CM

## 2022-12-14 ENCOUNTER — Other Ambulatory Visit: Payer: Self-pay

## 2022-12-14 MED ORDER — BUPRENORPHINE HCL-NALOXONE HCL 4-1 MG SL FILM
1.0000 | ORAL_FILM | Freq: Four times a day (QID) | SUBLINGUAL | 0 refills | Status: DC
  Filled 2022-12-14: qty 90, 23d supply, fill #0
  Filled 2022-12-14: qty 120, 30d supply, fill #0

## 2022-12-15 ENCOUNTER — Other Ambulatory Visit: Payer: Self-pay

## 2022-12-16 ENCOUNTER — Other Ambulatory Visit: Payer: Self-pay

## 2022-12-27 ENCOUNTER — Ambulatory Visit: Payer: Managed Care, Other (non HMO) | Attending: Cardiology | Admitting: Cardiology

## 2023-01-09 ENCOUNTER — Other Ambulatory Visit: Payer: Self-pay

## 2023-01-09 ENCOUNTER — Other Ambulatory Visit: Payer: Self-pay | Admitting: Family Medicine

## 2023-01-09 DIAGNOSIS — F1191 Opioid use, unspecified, in remission: Secondary | ICD-10-CM

## 2023-01-09 DIAGNOSIS — F1110 Opioid abuse, uncomplicated: Secondary | ICD-10-CM

## 2023-01-10 ENCOUNTER — Other Ambulatory Visit: Payer: Self-pay | Admitting: Family Medicine

## 2023-01-10 ENCOUNTER — Encounter: Payer: Self-pay | Admitting: Family Medicine

## 2023-01-10 ENCOUNTER — Other Ambulatory Visit: Payer: Self-pay

## 2023-01-10 DIAGNOSIS — F1191 Opioid use, unspecified, in remission: Secondary | ICD-10-CM

## 2023-01-10 DIAGNOSIS — F1110 Opioid abuse, uncomplicated: Secondary | ICD-10-CM

## 2023-01-10 MED ORDER — BUPRENORPHINE HCL-NALOXONE HCL 4-1 MG SL FILM
1.0000 | ORAL_FILM | Freq: Four times a day (QID) | SUBLINGUAL | 0 refills | Status: DC
  Filled 2023-01-10: qty 120, 30d supply, fill #0
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
## 2023-02-05 ENCOUNTER — Encounter: Payer: Self-pay | Admitting: Family Medicine

## 2023-02-05 DIAGNOSIS — F332 Major depressive disorder, recurrent severe without psychotic features: Secondary | ICD-10-CM

## 2023-02-05 DIAGNOSIS — F1191 Opioid use, unspecified, in remission: Secondary | ICD-10-CM

## 2023-02-07 ENCOUNTER — Other Ambulatory Visit: Payer: Self-pay | Admitting: Family Medicine

## 2023-02-07 ENCOUNTER — Other Ambulatory Visit: Payer: Self-pay

## 2023-02-07 DIAGNOSIS — F1191 Opioid use, unspecified, in remission: Secondary | ICD-10-CM

## 2023-02-07 DIAGNOSIS — F1110 Opioid abuse, uncomplicated: Secondary | ICD-10-CM

## 2023-02-09 ENCOUNTER — Encounter: Payer: Self-pay | Admitting: Family Medicine

## 2023-02-09 ENCOUNTER — Other Ambulatory Visit: Payer: Self-pay

## 2023-02-09 ENCOUNTER — Other Ambulatory Visit: Payer: Self-pay | Admitting: Family Medicine

## 2023-02-09 DIAGNOSIS — F1191 Opioid use, unspecified, in remission: Secondary | ICD-10-CM

## 2023-02-09 DIAGNOSIS — F1110 Opioid abuse, uncomplicated: Secondary | ICD-10-CM

## 2023-02-09 MED ORDER — BUPRENORPHINE HCL-NALOXONE HCL 4-1 MG SL FILM
1.0000 | ORAL_FILM | Freq: Four times a day (QID) | SUBLINGUAL | 0 refills | Status: DC
  Filled 2023-02-09: qty 120, 30d supply, fill #0

## 2023-02-09 MED ORDER — SERTRALINE HCL 100 MG PO TABS
100.0000 mg | ORAL_TABLET | Freq: Every day | ORAL | 5 refills | Status: DC
  Filled 2023-02-09: qty 30, 30d supply, fill #0
  Filled 2023-03-08: qty 30, 30d supply, fill #1
  Filled 2023-04-05: qty 30, 30d supply, fill #2
  Filled 2023-05-04: qty 30, 30d supply, fill #3

## 2023-02-10 ENCOUNTER — Other Ambulatory Visit (HOSPITAL_COMMUNITY): Payer: Self-pay

## 2023-02-14 ENCOUNTER — Encounter: Payer: Managed Care, Other (non HMO) | Admitting: Family Medicine

## 2023-02-15 ENCOUNTER — Other Ambulatory Visit: Payer: Self-pay

## 2023-02-15 ENCOUNTER — Encounter: Payer: Self-pay | Admitting: Family Medicine

## 2023-02-15 ENCOUNTER — Ambulatory Visit: Payer: Managed Care, Other (non HMO) | Admitting: Family Medicine

## 2023-02-15 VITALS — BP 138/89 | HR 102 | Ht 64.5 in | Wt 180.0 lb

## 2023-02-15 DIAGNOSIS — I10 Essential (primary) hypertension: Secondary | ICD-10-CM | POA: Diagnosis not present

## 2023-02-15 DIAGNOSIS — Z79899 Other long term (current) drug therapy: Secondary | ICD-10-CM

## 2023-02-15 DIAGNOSIS — F1110 Opioid abuse, uncomplicated: Secondary | ICD-10-CM

## 2023-02-15 DIAGNOSIS — F1191 Opioid use, unspecified, in remission: Secondary | ICD-10-CM | POA: Diagnosis not present

## 2023-02-15 DIAGNOSIS — Z5181 Encounter for therapeutic drug level monitoring: Secondary | ICD-10-CM | POA: Diagnosis not present

## 2023-02-15 DIAGNOSIS — E669 Obesity, unspecified: Secondary | ICD-10-CM

## 2023-02-15 MED ORDER — TIRZEPATIDE 2.5 MG/0.5ML ~~LOC~~ SOAJ
2.5000 mg | SUBCUTANEOUS | 1 refills | Status: DC
Start: 2023-02-15 — End: 2023-03-07
  Filled 2023-02-15: qty 2, 28d supply, fill #0

## 2023-02-15 MED ORDER — BUPRENORPHINE HCL-NALOXONE HCL 4-1 MG SL FILM
1.0000 | ORAL_FILM | Freq: Four times a day (QID) | SUBLINGUAL | 2 refills | Status: DC
Start: 2023-02-15 — End: 2023-05-30
  Filled 2023-02-15: qty 120, fill #0
  Filled 2023-03-08 – 2023-03-09 (×2): qty 120, 30d supply, fill #0
  Filled 2023-04-05 – 2023-04-07 (×2): qty 120, 30d supply, fill #1
  Filled 2023-05-02 – 2023-05-05 (×2): qty 120, 30d supply, fill #2

## 2023-02-15 NOTE — Assessment & Plan Note (Addendum)
UDS today Continue 4 mg QID She feels the increase in sertraline truly helped, she reports her sx are mostly presenting when she is worried about "running out" of medicine and she had the reflection to realize this. She has established with a pysch MD as well.  Discussed she could trial 8 mg BID for easier dosing-- she likes the QID because there are days she does not take the evening dose. She reports her goal is to eventually come off Suboxone Since doing well, will space to q3 months, and sent RX to pharmacy

## 2023-02-15 NOTE — Assessment & Plan Note (Addendum)
Currently eating lean proteins and vegetables Walking daily for to 1 hr Reports inability to decreased weight.   Wt Readings from Last 3 Encounters:  02/15/23 180 lb (81.6 kg)  11/15/22 184 lb 4.8 oz (83.6 kg)  09/13/22 176 lb 1.6 oz (79.9 kg)   In 05/2021 Weight was 61 KG and In Feb 2023  67.5kg (148lb) She has gained nearly 20kg in 1.5 years.

## 2023-02-15 NOTE — Progress Notes (Signed)
GYNECOLOGY OFFICE VISIT NOTE  History:   Kathy Bird is a 37 y.o. [id number] here today for OUD follow up.  Patient has been struggling with increased cravings -- currently suboxone 4 mg QID Reports that she feels much better on QID 1 year sober date is coming up on 11/17/22, going to [employer] with the kids to celebrate   Health Maintenance Due  Topic Date Due   COVID-19 Vaccine (1) Never done    Past Medical History:  Diagnosis Date   Anxiety    Bipolar disorder    Depression    Pregnancy induced hypertension    Schizophrenia    Seizures    as child. last seizure when 60yrs old    Past Surgical History:  Procedure Laterality Date   ADENOIDECTOMY      The following portions of the patient's history were reviewed and updated as appropriate: allergies, current medications, past family history, past medical history, past social history, past surgical history and problem list.   Health Maintenance:   Last pap: Lab Results  Component Value Date   DIAGPAP  05/26/2022    - Negative for Intraepithelial Lesions or Malignancy (NILM)   DIAGPAP - Benign reactive/reparative changes 05/26/2022   HPVHIGH Negative 05/26/2022    Last mammogram:  N/a    Review of Systems:  Pertinent items noted in HPI and remainder of comprehensive ROS otherwise negative.  Physical Exam:  BP 138/89   Pulse (!) 102   Ht 5' 4.5" (1.638 m)   Wt 180 lb (81.6 kg)   LMP  (LMP Unknown)   Breastfeeding No   BMI 30.42 kg/m  CONSTITUTIONAL: Well-developed, well-nourished female in no acute distress.  HEENT:  Normocephalic, atraumatic. External right and left ear normal. No scleral icterus.  NECK: Normal range of motion, supple, no masses noted on observation SKIN: No rash noted. Not diaphoretic. No erythema. No pallor. MUSCULOSKELETAL: Normal range of motion. No edema noted. NEUROLOGIC: Alert and oriented to person, place, and time. Normal muscle tone coordination.  PSYCHIATRIC: Normal  mood and affect. Normal behavior. Normal judgment and thought content. RESPIRATORY: Effort normal, no problems with respiration noted   Labs and Imaging No results found for this or any previous visit (from the past 168 hour(s)). No results found.    Assessment and Plan:   Problem List Items Addressed This Visit       Unprioritized   Essential hypertension    BP is well control improved, Has needed dose increase as weight has increased On HCTZ        Relevant Medications   tirzepatide (MOUNJARO) 2.5 MG/0.5ML Pen   Obesity (BMI 30-39.9)    Currently eating lean proteins and vegetables Walking daily for to 1 hr Reports inability to decreased weight.   Wt Readings from Last 3 Encounters:  02/15/23 180 lb (81.6 kg)  11/15/22 184 lb 4.8 oz (83.6 kg)  09/13/22 176 lb 1.6 oz (79.9 kg)  In 05/2021 Weight was 61 KG and In Feb 2023  67.5kg (148lb) She has gained nearly 20kg in 1.5 years.       Relevant Medications   tirzepatide (MOUNJARO) 2.5 MG/0.5ML Pen   Opioid use disorder in remission - Primary    UDS today Continue 4 mg QID She feels the increase in sertraline truly helped, she reports her sx are mostly presenting when she is worried about "running out" of medicine and she had the reflection to realize this. She has established with a pysch  MD as well.  Discussed she could trial 8 mg BID for easier dosing-- she likes the QID because there are days she does not take the evening dose. She reports her goal is to eventually come off Suboxone Since doing well, will space to q3 months, and sent RX to pharmacy      Relevant Medications   Buprenorphine HCl-Naloxone HCl (SUBOXONE) 4-1 MG FILM   Other Relevant Orders   ToxAssure Flex 15, Ur   Other Visit Diagnoses     Encounter for monitoring Suboxone maintenance therapy       Relevant Orders   ToxAssure Flex 15, Ur   Opioid use disorder, mild, abuse       Relevant Medications   Buprenorphine HCl-Naloxone HCl  (SUBOXONE) 4-1 MG FILM       Routine preventative health maintenance measures emphasized. Please refer to After Visit Summary for other counseling recommendations.   No follow-ups on file.    Total face-to-face time with patient: 15 minutes.  Over 50% of encounter was spent on counseling and coordination of care.   Federico Flake, MD/MPH Attending Family Medicine Physician, [facility] for [facility], [facility]

## 2023-02-15 NOTE — Assessment & Plan Note (Addendum)
BP is well control improved, Has needed dose increase as weight has increased On HCTZ 

## 2023-02-16 ENCOUNTER — Other Ambulatory Visit: Payer: Self-pay

## 2023-02-19 LAB — TOXASSURE FLEX 15, UR
6-ACETYLMORPHINE IA: NEGATIVE ng/mL
7-aminoclonazepam: NOT DETECTED ng/mg creat
AMPHETAMINES IA: NEGATIVE ng/mL
Alpha-hydroxyalprazolam: NOT DETECTED ng/mg creat
Alpha-hydroxymidazolam: NOT DETECTED ng/mg creat
Alpha-hydroxytriazolam: NOT DETECTED ng/mg creat
Alprazolam: NOT DETECTED ng/mg creat
BARBITURATES IA: NEGATIVE ng/mL
BUPRENORPHINE: POSITIVE
Benzodiazepines: NEGATIVE
Buprenorphine: 194 ng/mg creat
CANNABINOIDS IA: NEGATIVE ng/mL
COCAINE METABOLITE IA: NEGATIVE ng/mL
Clonazepam: NOT DETECTED ng/mg creat
Creatinine: 162 mg/dL
Desalkylflurazepam: NOT DETECTED ng/mg creat
Desmethyldiazepam: NOT DETECTED ng/mg creat
Desmethylflunitrazepam: NOT DETECTED ng/mg creat
Diazepam: NOT DETECTED ng/mg creat
ETHYL ALCOHOL Enzymatic: NEGATIVE g/dL
FENTANYL: NEGATIVE
Fentanyl: NOT DETECTED ng/mg creat
Flunitrazepam: NOT DETECTED ng/mg creat
Lorazepam: NOT DETECTED ng/mg creat
METHADONE IA: NEGATIVE ng/mL
METHADONE MTB IA: NEGATIVE ng/mL
Midazolam: NOT DETECTED ng/mg creat
Norbuprenorphine: >617 ng/mg creat
Norfentanyl: NOT DETECTED ng/mg creat
OPIATE CLASS IA: NEGATIVE ng/mL
OXYCODONE CLASS IA: NEGATIVE ng/mL
Oxazepam: NOT DETECTED ng/mg creat
PHENCYCLIDINE IA: NEGATIVE ng/mL
TAPENTADOL, IA: NEGATIVE ng/mL
TRAMADOL IA: NEGATIVE ng/mL
Temazepam: NOT DETECTED ng/mg creat

## 2023-02-21 ENCOUNTER — Other Ambulatory Visit: Payer: Self-pay

## 2023-02-27 ENCOUNTER — Other Ambulatory Visit: Payer: Self-pay

## 2023-02-28 ENCOUNTER — Other Ambulatory Visit: Payer: Self-pay

## 2023-03-06 ENCOUNTER — Encounter: Payer: Self-pay | Admitting: Family Medicine

## 2023-03-07 ENCOUNTER — Other Ambulatory Visit: Payer: Self-pay

## 2023-03-07 ENCOUNTER — Telehealth: Payer: Self-pay

## 2023-03-07 DIAGNOSIS — I10 Essential (primary) hypertension: Secondary | ICD-10-CM

## 2023-03-07 DIAGNOSIS — E669 Obesity, unspecified: Secondary | ICD-10-CM

## 2023-03-07 MED ORDER — TIRZEPATIDE 2.5 MG/0.5ML ~~LOC~~ SOAJ
2.5000 mg | SUBCUTANEOUS | 1 refills | Status: DC
Start: 2023-03-07 — End: 2023-03-22

## 2023-03-07 NOTE — Addendum Note (Signed)
Addended by: Isabell Jarvis on: 03/07/2023 03:58 PM   Modules accepted: Orders

## 2023-03-07 NOTE — Telephone Encounter (Signed)
Sent PA for Rx [facility] today. Awaiting approval.  Judeth Cornfield, RNC

## 2023-03-08 ENCOUNTER — Other Ambulatory Visit: Payer: Self-pay | Admitting: Family Medicine

## 2023-03-08 ENCOUNTER — Other Ambulatory Visit: Payer: Self-pay

## 2023-03-09 ENCOUNTER — Other Ambulatory Visit: Payer: Self-pay

## 2023-03-13 ENCOUNTER — Other Ambulatory Visit (HOSPITAL_COMMUNITY): Payer: Self-pay | Admitting: Internal Medicine

## 2023-03-13 DIAGNOSIS — R2240 Localized swelling, mass and lump, unspecified lower limb: Secondary | ICD-10-CM

## 2023-03-14 NOTE — Telephone Encounter (Signed)
Resent in more information today for PA. Awaiting response. Judeth Cornfield, RNC

## 2023-03-15 ENCOUNTER — Ambulatory Visit (HOSPITAL_COMMUNITY)
Admission: RE | Admit: 2023-03-15 | Discharge: 2023-03-15 | Disposition: A | Discharge status: Home / Self Care | Payer: Managed Care, Other (non HMO) | Source: Ambulatory Visit | Attending: Internal Medicine | Admitting: Internal Medicine

## 2023-03-15 DIAGNOSIS — R2242 Localized swelling, mass and lump, left lower limb: Secondary | ICD-10-CM | POA: Diagnosis not present

## 2023-03-15 DIAGNOSIS — R2240 Localized swelling, mass and lump, unspecified lower limb: Secondary | ICD-10-CM | POA: Insufficient documentation

## 2023-03-22 MED ORDER — TIRZEPATIDE 2.5 MG/0.5ML ~~LOC~~ SOAJ
2.5000 mg | SUBCUTANEOUS | 1 refills | Status: DC
Start: 2023-03-22 — End: 2023-05-30

## 2023-03-22 NOTE — Telephone Encounter (Signed)
Never had response for PA through covermymeds. Resent Rx to [employer] for PA.  Judeth Cornfield, RNC

## 2023-04-05 ENCOUNTER — Encounter: Payer: Self-pay | Admitting: Family Medicine

## 2023-04-05 ENCOUNTER — Other Ambulatory Visit: Payer: Self-pay

## 2023-04-07 ENCOUNTER — Other Ambulatory Visit: Payer: Self-pay

## 2023-04-27 ENCOUNTER — Other Ambulatory Visit: Payer: Self-pay

## 2023-04-27 ENCOUNTER — Other Ambulatory Visit: Payer: Self-pay | Admitting: Family Medicine

## 2023-04-27 DIAGNOSIS — F332 Major depressive disorder, recurrent severe without psychotic features: Secondary | ICD-10-CM

## 2023-04-27 MED ORDER — HYDROXYZINE PAMOATE 25 MG PO CAPS
25.0000 mg | ORAL_CAPSULE | Freq: Three times a day (TID) | ORAL | 5 refills | Status: DC | PRN
Start: 2023-04-27 — End: 2023-05-17
  Filled 2023-04-27: qty 30, 10d supply, fill #0
  Filled 2023-05-16: qty 30, 10d supply, fill #1

## 2023-05-02 ENCOUNTER — Other Ambulatory Visit: Payer: Self-pay

## 2023-05-05 ENCOUNTER — Other Ambulatory Visit: Payer: Self-pay

## 2023-05-16 ENCOUNTER — Encounter: Payer: Self-pay | Admitting: Family Medicine

## 2023-05-16 DIAGNOSIS — F332 Major depressive disorder, recurrent severe without psychotic features: Secondary | ICD-10-CM

## 2023-05-17 ENCOUNTER — Other Ambulatory Visit: Payer: Self-pay

## 2023-05-17 MED ORDER — HYDROXYZINE PAMOATE 25 MG PO CAPS
25.0000 mg | ORAL_CAPSULE | Freq: Four times a day (QID) | ORAL | 5 refills | Status: DC | PRN
Start: 2023-05-17 — End: 2024-05-28
  Filled 2023-05-17 (×2): qty 120, 30d supply, fill #0
  Filled 2023-06-27: qty 120, 30d supply, fill #1
  Filled 2023-08-24: qty 120, 30d supply, fill #2
  Filled 2023-12-12 – 2023-12-29 (×3): qty 120, 30d supply, fill #3
  Filled 2024-04-12: qty 120, 30d supply, fill #4
  Filled 2024-04-12: qty 120, 30d supply, fill #0

## 2023-05-18 ENCOUNTER — Other Ambulatory Visit: Payer: Self-pay

## 2023-05-18 MED ORDER — SERTRALINE HCL 100 MG PO TABS
150.0000 mg | ORAL_TABLET | Freq: Every day | ORAL | 5 refills | Status: DC
Start: 2023-05-18 — End: 2023-06-14
  Filled 2023-05-18 – 2023-05-31 (×4): qty 45, 30d supply, fill #0

## 2023-05-18 NOTE — Addendum Note (Signed)
Addended by: Merian Capron on: 05/18/2023 10:00 AM   Modules accepted: Orders

## 2023-05-23 ENCOUNTER — Encounter: Payer: Managed Care, Other (non HMO) | Admitting: Family Medicine

## 2023-05-25 ENCOUNTER — Other Ambulatory Visit (HOSPITAL_COMMUNITY): Payer: Self-pay

## 2023-05-30 ENCOUNTER — Other Ambulatory Visit: Payer: Self-pay

## 2023-05-30 ENCOUNTER — Encounter: Payer: Self-pay | Admitting: Family Medicine

## 2023-05-30 ENCOUNTER — Ambulatory Visit (INDEPENDENT_AMBULATORY_CARE_PROVIDER_SITE_OTHER): Payer: Managed Care, Other (non HMO) | Admitting: Family Medicine

## 2023-05-30 ENCOUNTER — Encounter: Payer: Managed Care, Other (non HMO) | Admitting: Family Medicine

## 2023-05-30 VITALS — BP 129/82 | HR 81 | Ht 65.0 in | Wt 192.0 lb

## 2023-05-30 DIAGNOSIS — F1191 Opioid use, unspecified, in remission: Secondary | ICD-10-CM

## 2023-05-30 MED ORDER — BUPRENORPHINE HCL-NALOXONE HCL 8-2 MG SL FILM
1.0000 | ORAL_FILM | Freq: Two times a day (BID) | SUBLINGUAL | 2 refills | Status: DC
Start: 2023-05-30 — End: 2023-06-14
  Filled 2023-05-30 – 2023-06-02 (×2): qty 60, 30d supply, fill #0

## 2023-05-30 NOTE — Progress Notes (Signed)
[facility] GYNECOLOGY OFFICE VISIT NOTE  History:   Kathy Bird is a 37 y.o. [id number] here today for OUD follow up.  Doing well with suboxone, no change to dose No relapses Somehow going to run out a bit early, wondering if she can go without for a few days Also considering weaning and discontinuing Has been having some challenging times over the past month, her 69 year old niece is living with her and her two children aged 1 and 66 due to brother's struggles with addiction   Health Maintenance Due  Topic Date Due   COVID-19 Vaccine (1) Never done    Past Medical History:  Diagnosis Date   Anxiety    Bipolar disorder (HCC)    Depression    Pregnancy induced hypertension    Schizophrenia (HCC)    Seizures (HCC)    as child. last seizure when 9yrs old    Past Surgical History:  Procedure Laterality Date   ADENOIDECTOMY      The following portions of the patient's history were reviewed and updated as appropriate: allergies, current medications, past family history, past medical history, past social history, past surgical history and problem list.   Health Maintenance:   Last pap: Lab Results  Component Value Date   DIAGPAP  05/26/2022    - Negative for Intraepithelial Lesions or Malignancy (NILM)   DIAGPAP - Benign reactive/reparative changes 05/26/2022   HPVHIGH Negative 05/26/2022    Last mammogram:  N/a    Review of Systems:  Pertinent items noted in HPI and remainder of comprehensive ROS otherwise negative.  Physical Exam:  BP 129/82   Pulse 81   Ht 5\' 5"  (1.651 m)   Wt 192 lb (87.1 kg)   LMP  (LMP Unknown)   Breastfeeding No   BMI 31.95 kg/m  CONSTITUTIONAL: Well-developed, well-nourished female in no acute distress.  HEENT:  Normocephalic, atraumatic. External right and left ear normal. No scleral icterus.  NECK: Normal range of motion, supple, no masses noted on observation SKIN: No rash noted. Not diaphoretic. No erythema. No  pallor. MUSCULOSKELETAL: Normal range of motion. No edema noted. NEUROLOGIC: Alert and oriented to person, place, and time. Normal muscle tone coordination.  PSYCHIATRIC: Normal mood and affect. Normal behavior. Normal judgment and thought content. RESPIRATORY: Effort normal, no problems with respiration noted   Labs and Imaging No results found for this or any previous visit (from the past 168 hour(s)). No results found.    Assessment and Plan:   Problem List Items Addressed This Visit       Other   Opioid use disorder in remission - Primary    Doing well. Feels like large amount of films with 4 mg size is contributing to having misplaced some. Will do 8 mg that she can split herself and still do 4 mg QID. Recommended strongly against fully running out for several days, recommended cutting back and stretching it out. Also recommended against weaning/coming off but would support her if she chose to do so. Also provided support, she is doing a lot for her family right now and I recognized this.       Relevant Medications   Buprenorphine HCl-Naloxone HCl (SUBOXONE) 8-2 MG FILM   Other Relevant Orders   ToxAssure Flex 15, Ur    Routine preventative health maintenance measures emphasized. Please refer to After Visit Summary for other counseling recommendations.   Return in about 3 months (around 08/30/2023) for [place] patient, OUD f/u.  Total face-to-face time with patient: 20 minutes.  Over 50% of encounter was spent on counseling and coordination of care.   Venora Maples, MD/MPH Attending Family Medicine Physician, [facility] for [facility], [facility]

## 2023-05-30 NOTE — Assessment & Plan Note (Signed)
Doing well. Feels like large amount of films with 4 mg size is contributing to having misplaced some. Will do 8 mg that she can split herself and still do 4 mg QID. Recommended strongly against fully running out for several days, recommended cutting back and stretching it out. Also recommended against weaning/coming off but would support her if she chose to do so. Also provided support, she is doing a lot for her family right now and I recognized this.

## 2023-05-31 ENCOUNTER — Other Ambulatory Visit (HOSPITAL_COMMUNITY): Payer: Self-pay

## 2023-05-31 ENCOUNTER — Other Ambulatory Visit: Payer: Self-pay | Admitting: Family Medicine

## 2023-05-31 ENCOUNTER — Other Ambulatory Visit: Payer: Self-pay

## 2023-05-31 DIAGNOSIS — F1110 Opioid abuse, uncomplicated: Secondary | ICD-10-CM

## 2023-05-31 DIAGNOSIS — F1191 Opioid use, unspecified, in remission: Secondary | ICD-10-CM

## 2023-06-01 ENCOUNTER — Other Ambulatory Visit: Payer: Self-pay

## 2023-06-02 ENCOUNTER — Other Ambulatory Visit (HOSPITAL_COMMUNITY): Payer: Self-pay

## 2023-06-02 ENCOUNTER — Other Ambulatory Visit: Payer: Self-pay

## 2023-06-08 ENCOUNTER — Encounter: Payer: Self-pay | Admitting: Family Medicine

## 2023-06-08 ENCOUNTER — Other Ambulatory Visit: Payer: Self-pay

## 2023-06-08 DIAGNOSIS — F411 Generalized anxiety disorder: Secondary | ICD-10-CM

## 2023-06-08 DIAGNOSIS — F1191 Opioid use, unspecified, in remission: Secondary | ICD-10-CM

## 2023-06-08 DIAGNOSIS — G479 Sleep disorder, unspecified: Secondary | ICD-10-CM

## 2023-06-08 MED ORDER — GABAPENTIN 100 MG PO CAPS
100.0000 mg | ORAL_CAPSULE | Freq: Every day | ORAL | 0 refills | Status: DC
Start: 2023-06-08 — End: 2023-06-14
  Filled 2023-06-08: qty 30, 30d supply, fill #0

## 2023-06-09 ENCOUNTER — Other Ambulatory Visit: Payer: Self-pay

## 2023-06-12 ENCOUNTER — Other Ambulatory Visit: Payer: Self-pay

## 2023-06-14 ENCOUNTER — Other Ambulatory Visit: Payer: Self-pay

## 2023-06-14 ENCOUNTER — Ambulatory Visit (INDEPENDENT_AMBULATORY_CARE_PROVIDER_SITE_OTHER): Payer: Medicaid Other | Admitting: Family Medicine

## 2023-06-14 ENCOUNTER — Encounter: Payer: Self-pay | Admitting: Family Medicine

## 2023-06-14 VITALS — BP 123/82 | HR 80 | Wt 192.4 lb

## 2023-06-14 DIAGNOSIS — F332 Major depressive disorder, recurrent severe without psychotic features: Secondary | ICD-10-CM | POA: Diagnosis not present

## 2023-06-14 DIAGNOSIS — F411 Generalized anxiety disorder: Secondary | ICD-10-CM

## 2023-06-14 DIAGNOSIS — F1191 Opioid use, unspecified, in remission: Secondary | ICD-10-CM

## 2023-06-14 DIAGNOSIS — F1110 Opioid abuse, uncomplicated: Secondary | ICD-10-CM | POA: Diagnosis not present

## 2023-06-14 DIAGNOSIS — I10 Essential (primary) hypertension: Secondary | ICD-10-CM | POA: Diagnosis not present

## 2023-06-14 MED ORDER — NALOXONE HCL 4 MG/0.1ML NA LIQD
NASAL | 1 refills | Status: AC
Start: 2023-06-14 — End: ?
  Filled 2023-06-14: qty 2, 1d supply, fill #0
  Filled 2023-07-25: qty 2, 1d supply, fill #1

## 2023-06-14 MED ORDER — SERTRALINE HCL 100 MG PO TABS
200.0000 mg | ORAL_TABLET | Freq: Every day | ORAL | 2 refills | Status: DC
Start: 2023-06-14 — End: 2023-07-25
  Filled 2023-06-14 – 2023-06-17 (×2): qty 30, 15d supply, fill #0
  Filled 2023-06-27: qty 30, 15d supply, fill #1
  Filled 2023-07-12: qty 30, 15d supply, fill #2

## 2023-06-14 MED ORDER — BUPRENORPHINE HCL-NALOXONE HCL 4-1 MG SL FILM
2.0000 | ORAL_FILM | Freq: Three times a day (TID) | SUBLINGUAL | 0 refills | Status: DC
Start: 2023-06-14 — End: 2023-07-25
  Filled 2023-06-14: qty 30, 5d supply, fill #0
  Filled 2023-06-14: qty 30, fill #0
  Filled 2023-06-19: qty 30, 10d supply, fill #0

## 2023-06-14 NOTE — Progress Notes (Signed)
GYNECOLOGY OFFICE VISIT NOTE  History:   Kathy Bird is a 37 y.o. [id number] here today for follow up mood and OUD.  OUD: - She is waking up in the middle of the night with cravings, interested in a third dose of Suboxone to help w cravings - Has used Suboxone films PRN in the middle of the day -- tries to use half, but has used whole film midday or at night when having sxs - Symptoms include palpitations, sweating, feeling clammy, skin crawling sensation - Was Rx'ed Gabapentin, but never started it -- she is worried about polypharmacy and does not want to start on another medication - Last use 11/17/2021 - Recently quit job bec she felt that it was a trigger for use, has new job offer, excited about it - Was seeing counseling at [employer] [facility], has not seen them in a month, planning on scheduling f/up  MDD  bipolar disorder w/o psychosis: - Feels better on Zoloft, currently on 150mg  daily - Having some ups and downs - Increased stressors especially given work related stress - Finds strength in her kids, has support from her mom - Interested in higher dose given acute stressors - No SI/HI  Health Maintenance Due  Topic Date Due   COVID-19 Vaccine (1) Never done   INFLUENZA VACCINE  06/01/2023    Past Medical History:  Diagnosis Date   Anxiety    Bipolar disorder (HCC)    Depression    Pregnancy induced hypertension    Schizophrenia (HCC)    Seizures (HCC)    as child. last seizure when 27yrs old    Past Surgical History:  Procedure Laterality Date   ADENOIDECTOMY      The following portions of the patient's history were reviewed and updated as appropriate: allergies, current medications, past family history, past medical history, past social history, past surgical history and problem list.   Health Maintenance:   Last pap: Lab Results  Component Value Date   DIAGPAP  05/26/2022    - Negative for Intraepithelial Lesions or Malignancy (NILM)   DIAGPAP - Benign  reactive/reparative changes 05/26/2022   HPVHIGH Negative 05/26/2022   Review of Systems:  Pertinent items noted in HPI and remainder of comprehensive ROS otherwise negative.  Physical Exam:   Today's Vitals   06/14/23 0953  BP: 123/82  Pulse: 80  Weight: 192 lb 6.4 oz (87.3 kg)   Body mass index is 32.02 kg/m.  CONSTITUTIONAL: Well-developed, well-nourished female in no acute distress.  HEENT:  Normocephalic, atraumatic. External right and left ear normal. No scleral icterus.  NECK: Normal range of motion, supple, no masses noted on observation SKIN: No rash noted. Not diaphoretic. No erythema. No pallor. MUSCULOSKELETAL: Normal range of motion. No edema noted. NEUROLOGIC: Alert and oriented to person, place, and time. Normal muscle tone coordination.  PSYCHIATRIC: Normal mood and affect. Normal behavior. Normal judgment and thought content. RESPIRATORY: Effort normal, no problems with respiration noted  Assessment and Plan:  Opioid use disorder in remission - D/w pt option to increase dose -- will increase Suboxone to 8-2mg  TID, requesting 4-1mg  films so she does not have to split, in case she uses 4-1mg  film mid-day and another at night - D/w pt this is max dose that I am comfortable prescribing higher than 24mg /daily buprenorphine - Recommend continue with counseling to work on coping strategies - UDS ordered today   MDD (major depressive disorder), recurrent severe, without psychosis (HCC) - Will uptitrate Zoloft to 200mg   daily - F/up 1 month to ensure symptoms better managed - Per chart review, pt with bipolar d/o but no hx psychosis/mania  Essential hypertension - Well controlled today, continue hydrochlorothiazide 25mg  daily  Please refer to After Visit Summary for other counseling recommendations.   Return in about 4 weeks (around 07/12/2023), or Med follow up, MD only, for MD only.    Total face-to-face time with patient: 30 minutes.  Over 50% of encounter was  spent on counseling and coordination of care.  Sundra Aland, MD OB Fellow, [facility], Center for [facility]

## 2023-06-16 NOTE — Progress Notes (Signed)
Attestation of Attending Supervision of OB Fellow: Evaluation and management procedures were performed by the [facility] Fellow under my supervision and collaboration.  I have reviewed the OB Fellow's note and chart, and I agree with the management and plan.  Reva Bores, MD Center for [facility] Attending 06/16/2023 3:56 PM

## 2023-06-17 ENCOUNTER — Other Ambulatory Visit (HOSPITAL_COMMUNITY): Payer: Self-pay

## 2023-06-19 ENCOUNTER — Other Ambulatory Visit: Payer: Self-pay

## 2023-06-27 ENCOUNTER — Other Ambulatory Visit: Payer: Self-pay

## 2023-06-27 MED ORDER — BUPRENORPHINE HCL-NALOXONE HCL 8-2 MG SL FILM
1.0000 | ORAL_FILM | Freq: Three times a day (TID) | SUBLINGUAL | 0 refills | Status: DC
Start: 2023-06-27 — End: 2023-07-25
  Filled 2023-06-27 – 2023-06-30 (×3): qty 90, 30d supply, fill #0
  Filled ????-??-??: fill #0

## 2023-06-28 ENCOUNTER — Other Ambulatory Visit: Payer: Self-pay

## 2023-06-30 ENCOUNTER — Other Ambulatory Visit: Payer: Self-pay

## 2023-07-06 ENCOUNTER — Ambulatory Visit (INDEPENDENT_AMBULATORY_CARE_PROVIDER_SITE_OTHER): Payer: Medicaid Other | Admitting: Family Medicine

## 2023-07-06 ENCOUNTER — Encounter: Payer: Self-pay | Admitting: Family Medicine

## 2023-07-06 VITALS — BP 132/91 | HR 97 | Wt 188.9 lb

## 2023-07-06 DIAGNOSIS — F1191 Opioid use, unspecified, in remission: Secondary | ICD-10-CM

## 2023-07-06 DIAGNOSIS — F411 Generalized anxiety disorder: Secondary | ICD-10-CM

## 2023-07-06 NOTE — Assessment & Plan Note (Signed)
Stable Uds today

## 2023-07-06 NOTE — Assessment & Plan Note (Signed)
Doing well on current regimen, no changes.
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
## 2023-07-11 LAB — TOXASSURE FLEX 15, UR
6-ACETYLMORPHINE IA: NEGATIVE ng/mL
7-aminoclonazepam: NOT DETECTED ng/mg{creat}
AMPHETAMINES IA: NEGATIVE ng/mL
Alpha-hydroxyalprazolam: NOT DETECTED ng/mg{creat}
Alpha-hydroxymidazolam: NOT DETECTED ng/mg{creat}
Alpha-hydroxytriazolam: NOT DETECTED ng/mg{creat}
Alprazolam: NOT DETECTED ng/mg{creat}
BARBITURATES IA: NEGATIVE ng/mL
BUPRENORPHINE: POSITIVE
Benzodiazepines: NEGATIVE
Buprenorphine: 134 ng/mg{creat}
CANNABINOIDS IA: NEGATIVE ng/mL
COCAINE METABOLITE IA: NEGATIVE ng/mL
Clonazepam: NOT DETECTED ng/mg{creat}
Creatinine: 210 mg/dL
Desalkylflurazepam: NOT DETECTED ng/mg{creat}
Desmethyldiazepam: NOT DETECTED ng/mg{creat}
Desmethylflunitrazepam: NOT DETECTED ng/mg{creat}
Diazepam: NOT DETECTED ng/mg{creat}
ETHYL ALCOHOL Enzymatic: NEGATIVE g/dL
FENTANYL: NEGATIVE
Fentanyl: NOT DETECTED ng/mg{creat}
Flunitrazepam: NOT DETECTED ng/mg{creat}
Lorazepam: NOT DETECTED ng/mg{creat}
METHADONE IA: NEGATIVE ng/mL
METHADONE MTB IA: NEGATIVE ng/mL
Midazolam: NOT DETECTED ng/mg{creat}
Norbuprenorphine: >476 ng/mg{creat}
Norfentanyl: NOT DETECTED ng/mg{creat}
OPIATE CLASS IA: NEGATIVE ng/mL
OXYCODONE CLASS IA: NEGATIVE ng/mL
Oxazepam: NOT DETECTED ng/mg{creat}
PHENCYCLIDINE IA: NEGATIVE ng/mL
TAPENTADOL, IA: NEGATIVE ng/mL
TRAMADOL IA: NEGATIVE ng/mL
Temazepam: NOT DETECTED ng/mg{creat}

## 2023-07-13 ENCOUNTER — Other Ambulatory Visit: Payer: Self-pay

## 2023-07-13 ENCOUNTER — Encounter: Payer: Managed Care, Other (non HMO) | Admitting: Family Medicine

## 2023-07-18 ENCOUNTER — Other Ambulatory Visit: Payer: Self-pay

## 2023-07-24 ENCOUNTER — Other Ambulatory Visit: Payer: Self-pay

## 2023-07-25 ENCOUNTER — Other Ambulatory Visit: Payer: Self-pay

## 2023-07-25 ENCOUNTER — Encounter: Payer: Self-pay | Admitting: Family Medicine

## 2023-07-25 ENCOUNTER — Other Ambulatory Visit: Payer: Self-pay | Admitting: Family Medicine

## 2023-07-25 DIAGNOSIS — F332 Major depressive disorder, recurrent severe without psychotic features: Secondary | ICD-10-CM

## 2023-07-25 DIAGNOSIS — F1191 Opioid use, unspecified, in remission: Secondary | ICD-10-CM

## 2023-07-25 MED ORDER — SERTRALINE HCL 100 MG PO TABS
200.0000 mg | ORAL_TABLET | Freq: Every day | ORAL | 2 refills | Status: DC
Start: 2023-07-25 — End: 2023-08-28
  Filled 2023-07-25 – 2023-07-26 (×2): qty 30, 15d supply, fill #0
  Filled 2023-08-08: qty 30, 15d supply, fill #1
  Filled 2023-08-19: qty 30, 15d supply, fill #2

## 2023-07-25 MED ORDER — BUPRENORPHINE HCL-NALOXONE HCL 8-2 MG SL FILM
1.0000 | ORAL_FILM | Freq: Three times a day (TID) | SUBLINGUAL | 0 refills | Status: DC
Start: 2023-07-25 — End: 2023-08-28
  Filled 2023-07-25 – 2023-07-28 (×2): qty 90, 30d supply, fill #0

## 2023-07-26 ENCOUNTER — Other Ambulatory Visit: Payer: Self-pay

## 2023-07-28 ENCOUNTER — Other Ambulatory Visit: Payer: Self-pay

## 2023-07-28 ENCOUNTER — Encounter: Payer: Self-pay | Admitting: Family Medicine

## 2023-08-08 ENCOUNTER — Other Ambulatory Visit (HOSPITAL_COMMUNITY): Payer: Self-pay

## 2023-08-19 ENCOUNTER — Other Ambulatory Visit (HOSPITAL_COMMUNITY): Payer: Self-pay

## 2023-08-24 ENCOUNTER — Other Ambulatory Visit: Payer: Self-pay

## 2023-08-24 ENCOUNTER — Other Ambulatory Visit: Payer: Self-pay | Admitting: Family Medicine

## 2023-08-24 DIAGNOSIS — F1191 Opioid use, unspecified, in remission: Secondary | ICD-10-CM

## 2023-08-24 DIAGNOSIS — F332 Major depressive disorder, recurrent severe without psychotic features: Secondary | ICD-10-CM

## 2023-08-25 ENCOUNTER — Other Ambulatory Visit: Payer: Self-pay

## 2023-08-25 ENCOUNTER — Encounter: Payer: Self-pay | Admitting: Family Medicine

## 2023-08-25 DIAGNOSIS — F332 Major depressive disorder, recurrent severe without psychotic features: Secondary | ICD-10-CM

## 2023-08-25 DIAGNOSIS — F1191 Opioid use, unspecified, in remission: Secondary | ICD-10-CM

## 2023-08-28 ENCOUNTER — Other Ambulatory Visit: Payer: Self-pay

## 2023-08-28 ENCOUNTER — Encounter: Payer: Self-pay | Admitting: Family Medicine

## 2023-08-28 MED ORDER — BUPRENORPHINE HCL-NALOXONE HCL 8-2 MG SL FILM
1.0000 | ORAL_FILM | Freq: Three times a day (TID) | SUBLINGUAL | 0 refills | Status: DC
Start: 2023-08-28 — End: 2023-09-19
  Filled 2023-08-28: qty 81, 27d supply, fill #0
  Filled 2023-08-28: qty 9, 3d supply, fill #0
  Filled 2023-08-30 (×2): qty 81, 27d supply, fill #1

## 2023-08-28 MED ORDER — SERTRALINE HCL 100 MG PO TABS
200.0000 mg | ORAL_TABLET | Freq: Every day | ORAL | 2 refills | Status: DC
  Filled 2023-08-28: qty 60, 30d supply, fill #0
  Filled 2023-09-14 (×2): qty 30, 15d supply, fill #1

## 2023-08-28 MED ORDER — SERTRALINE HCL 200 MG PO CAPS
200.0000 mg | ORAL_CAPSULE | Freq: Every day | ORAL | 0 refills | Status: DC
Start: 2023-08-28 — End: 2023-08-28
  Filled 2023-08-28: qty 30, 30d supply, fill #0

## 2023-08-29 ENCOUNTER — Other Ambulatory Visit: Payer: Self-pay

## 2023-08-30 ENCOUNTER — Other Ambulatory Visit: Payer: Self-pay

## 2023-08-30 ENCOUNTER — Other Ambulatory Visit: Payer: Medicaid Other

## 2023-09-14 ENCOUNTER — Other Ambulatory Visit: Payer: Self-pay

## 2023-09-19 ENCOUNTER — Encounter: Payer: Self-pay | Admitting: Family Medicine

## 2023-09-19 ENCOUNTER — Other Ambulatory Visit: Payer: Self-pay

## 2023-09-19 DIAGNOSIS — F1191 Opioid use, unspecified, in remission: Secondary | ICD-10-CM

## 2023-09-19 MED ORDER — BUPRENORPHINE HCL-NALOXONE HCL 8-2 MG SL FILM
1.0000 | ORAL_FILM | Freq: Three times a day (TID) | SUBLINGUAL | 0 refills | Status: DC
  Filled 2023-09-19 – 2023-09-25 (×3): qty 90, 30d supply, fill #0
  Filled ????-??-?? (×2): fill #0

## 2023-09-20 ENCOUNTER — Other Ambulatory Visit: Payer: Self-pay | Admitting: Family Medicine

## 2023-09-20 ENCOUNTER — Other Ambulatory Visit: Payer: Self-pay

## 2023-09-20 DIAGNOSIS — F332 Major depressive disorder, recurrent severe without psychotic features: Secondary | ICD-10-CM

## 2023-09-21 ENCOUNTER — Other Ambulatory Visit: Payer: Self-pay

## 2023-09-25 ENCOUNTER — Other Ambulatory Visit: Payer: Self-pay

## 2023-09-25 ENCOUNTER — Telehealth: Payer: Self-pay | Admitting: Lactation Services

## 2023-09-25 ENCOUNTER — Other Ambulatory Visit: Payer: Self-pay | Admitting: Family Medicine

## 2023-09-25 DIAGNOSIS — F332 Major depressive disorder, recurrent severe without psychotic features: Secondary | ICD-10-CM

## 2023-09-25 MED ORDER — SERTRALINE HCL 100 MG PO TABS
200.0000 mg | ORAL_TABLET | Freq: Every day | ORAL | 0 refills | Status: DC
  Filled 2023-09-25: qty 180, 90d supply, fill #0

## 2023-09-25 NOTE — Telephone Encounter (Signed)
Patient requesting a refill on Zoloft. Per chart review, patient should have 2 refills at the Pharmacy.   Called [facility] and was informed she does not have any refills. New RX sent in.

## 2023-09-26 ENCOUNTER — Other Ambulatory Visit: Payer: Self-pay

## 2023-10-06 ENCOUNTER — Other Ambulatory Visit: Payer: Self-pay

## 2023-10-17 ENCOUNTER — Encounter: Payer: Self-pay | Admitting: Family Medicine

## 2023-10-17 ENCOUNTER — Other Ambulatory Visit (HOSPITAL_COMMUNITY): Payer: Self-pay

## 2023-10-17 MED ORDER — BUPRENORPHINE ER 300 MG/1.5ML ~~LOC~~ SOSY
300.0000 mg | PREFILLED_SYRINGE | Freq: Once | SUBCUTANEOUS | 0 refills | Status: AC
  Filled 2023-10-17: qty 1.5, 1d supply, fill #0
  Filled 2023-10-31 (×2): qty 1.5, 28d supply, fill #0

## 2023-10-17 NOTE — Telephone Encounter (Signed)
Called patient to discuss depression. Very tearful on our call. Reports she is not worried about relapsing at present just having a hard time and feels like she's in a dark place. Suggested seeing BH with Asher Muir, she is amenable, will route this message to her.   Needs refill on suboxone soon as well. Also agrees to visit at next [facility] clinic in two days, discussed transition to sublocade at that time and she is amenable. Rx sent to pharmacy, will notify pharmacy staff to courier it here for Thursday.

## 2023-10-18 ENCOUNTER — Other Ambulatory Visit: Payer: Self-pay

## 2023-10-18 ENCOUNTER — Other Ambulatory Visit (HOSPITAL_COMMUNITY): Payer: Self-pay

## 2023-10-18 NOTE — Progress Notes (Signed)
Prescription cannot be filled at this time due to temporary procurement issue. Provider office is aware.

## 2023-10-19 ENCOUNTER — Institutional Professional Consult (permissible substitution): Payer: Medicaid Other

## 2023-10-19 ENCOUNTER — Other Ambulatory Visit: Payer: Self-pay

## 2023-10-19 ENCOUNTER — Encounter: Payer: Self-pay | Admitting: Family Medicine

## 2023-10-19 ENCOUNTER — Ambulatory Visit: Payer: Medicaid Other | Admitting: Family Medicine

## 2023-10-19 VITALS — BP 111/71 | HR 91 | Wt 194.8 lb

## 2023-10-19 DIAGNOSIS — F411 Generalized anxiety disorder: Secondary | ICD-10-CM | POA: Diagnosis not present

## 2023-10-19 DIAGNOSIS — F1191 Opioid use, unspecified, in remission: Secondary | ICD-10-CM | POA: Diagnosis not present

## 2023-10-19 MED ORDER — BUPRENORPHINE HCL-NALOXONE HCL 8-2 MG SL FILM
1.0000 | ORAL_FILM | Freq: Three times a day (TID) | SUBLINGUAL | 0 refills | Status: DC
  Filled 2023-10-19 – 2023-10-23 (×4): qty 90, 30d supply, fill #0

## 2023-10-19 NOTE — Progress Notes (Signed)
[facility] clinic postpartum consult complete.  Baby Tammy with mom and well appearing, well nourished and interactive during visit. Mom verbalizing feelings of depression and stress.  Stressors identified and coping strategies discussed.  Mom interested in meeting with behavior health.  Referral complete and appointment scheduled for tomorrow.  Will follow in [facility] clinic as needed.
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
## 2023-10-20 ENCOUNTER — Encounter: Payer: Self-pay | Admitting: Family Medicine

## 2023-10-20 ENCOUNTER — Ambulatory Visit: Payer: Medicaid Other | Admitting: Clinical

## 2023-10-20 DIAGNOSIS — F332 Major depressive disorder, recurrent severe without psychotic features: Secondary | ICD-10-CM

## 2023-10-20 DIAGNOSIS — F430 Acute stress reaction: Secondary | ICD-10-CM | POA: Diagnosis not present

## 2023-10-20 DIAGNOSIS — F411 Generalized anxiety disorder: Secondary | ICD-10-CM

## 2023-10-20 DIAGNOSIS — F3160 Bipolar disorder, current episode mixed, unspecified: Secondary | ICD-10-CM
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
## 2023-10-20 NOTE — Patient Instructions (Addendum)
Center for [facility] at [facility] [address] [place], [place] [address] [phone] (main office) [phone] [facility] office)  [facility]  [address] [place], [place] [address] [phone] [email or web address]  [facility]:  [address], 2nd floor, [place], [place] [address] [phone])  Main line [phone]  [place] location **Parking is available in the [address] parking deck.  [place]:  [address], [place], [place] [address] [phone]) Main line [phone]  [place] location  **Located on the backside of the [facility]. Limited parking is available off of [address]  [place] hours: Monday -Friday 8:30am-4:30pm  [email or web address]    If immediate emergency, call 9-1-1, or [facility] the [place] 24 hour crisis hotline at: [phone]  ------------------------------------------------------------------------------------------------------------------------------  [facility] [email or web address]   [facility]  [address] [place], [place] [address] [phone] or [phone] [facility] 24/7 FOR ANYONE [address] [place], [place]  [phone] Fax: [phone] [email or web address] *Interpreters available *Accepts all insurance and uninsured for Urgent Care needs *Accepts Medicaid and uninsured for outpatient treatment (below)    ONLY FOR [facility]  Below:   Outpatient New Patient Assessment/Therapy Walk-ins:        Monday -Thursday 8am until slots are full.        Every Friday 1pm-4pm  (first come, first served)                   New Patient Psychiatry/Medication Management        Monday-Friday 8am-11am (first come, first served)              For all walk-ins  we ask that you arrive by 7:15am, because patients will be seen in the order of arrival.

## 2023-10-20 NOTE — Assessment & Plan Note (Signed)
Unable to get medication delivered to clinic in time for today's visit, plan to schedule her for next week to administer first loading dose. UDS collected today
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
## 2023-10-23 ENCOUNTER — Other Ambulatory Visit (HOSPITAL_COMMUNITY): Payer: Self-pay

## 2023-10-23 ENCOUNTER — Other Ambulatory Visit: Payer: Self-pay

## 2023-10-26 ENCOUNTER — Institutional Professional Consult (permissible substitution): Payer: Medicaid Other

## 2023-10-26 ENCOUNTER — Ambulatory Visit: Payer: Medicaid Other | Admitting: Family Medicine

## 2023-10-26 ENCOUNTER — Encounter: Payer: Medicaid Other | Admitting: Family Medicine

## 2023-10-30 ENCOUNTER — Other Ambulatory Visit (HOSPITAL_BASED_OUTPATIENT_CLINIC_OR_DEPARTMENT_OTHER): Payer: Self-pay
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
## 2023-10-31 ENCOUNTER — Other Ambulatory Visit (HOSPITAL_COMMUNITY): Payer: Self-pay

## 2023-10-31 ENCOUNTER — Other Ambulatory Visit: Payer: Self-pay

## 2023-11-06 ENCOUNTER — Other Ambulatory Visit: Payer: Self-pay

## 2023-11-07 ENCOUNTER — Other Ambulatory Visit: Payer: Self-pay

## 2023-11-08 ENCOUNTER — Other Ambulatory Visit: Payer: Self-pay

## 2023-11-08 ENCOUNTER — Other Ambulatory Visit: Payer: Self-pay | Admitting: Family Medicine

## 2023-11-09 ENCOUNTER — Other Ambulatory Visit: Payer: Self-pay

## 2023-11-10 MED ORDER — SERTRALINE HCL 100 MG PO TABS
200.0000 mg | ORAL_TABLET | Freq: Every day | ORAL | 3 refills | Status: DC
  Filled 2023-11-10 – 2023-11-14 (×3): qty 180, 90d supply, fill #0

## 2023-11-13 ENCOUNTER — Other Ambulatory Visit: Payer: Self-pay

## 2023-11-13 ENCOUNTER — Ambulatory Visit (INDEPENDENT_AMBULATORY_CARE_PROVIDER_SITE_OTHER): Payer: Medicaid Other | Admitting: Clinical

## 2023-11-13 DIAGNOSIS — F3160 Bipolar disorder, current episode mixed, unspecified: Secondary | ICD-10-CM

## 2023-11-13 DIAGNOSIS — Z658 Other specified problems related to psychosocial circumstances: Secondary | ICD-10-CM

## 2023-11-14 ENCOUNTER — Other Ambulatory Visit (HOSPITAL_COMMUNITY): Payer: Self-pay

## 2023-11-14 ENCOUNTER — Other Ambulatory Visit: Payer: Self-pay

## 2023-11-16 ENCOUNTER — Encounter: Payer: Self-pay | Admitting: Family Medicine

## 2023-11-16 ENCOUNTER — Ambulatory Visit: Payer: Medicaid Other | Admitting: Family Medicine

## 2023-11-16 ENCOUNTER — Other Ambulatory Visit: Payer: Self-pay

## 2023-11-16 ENCOUNTER — Other Ambulatory Visit (HOSPITAL_COMMUNITY): Payer: Self-pay

## 2023-11-16 VITALS — BP 123/87 | HR 86 | Wt 191.2 lb

## 2023-11-16 DIAGNOSIS — F411 Generalized anxiety disorder: Secondary | ICD-10-CM

## 2023-11-16 DIAGNOSIS — F332 Major depressive disorder, recurrent severe without psychotic features: Secondary | ICD-10-CM | POA: Diagnosis not present

## 2023-11-16 DIAGNOSIS — F1191 Opioid use, unspecified, in remission: Secondary | ICD-10-CM | POA: Diagnosis not present

## 2023-11-16 MED ORDER — TRAZODONE HCL 50 MG PO TABS
50.0000 mg | ORAL_TABLET | Freq: Every day | ORAL | 1 refills | Status: DC
  Filled 2023-11-16 – 2024-01-25 (×3): qty 30, 30d supply, fill #0
  Filled 2024-02-19: qty 30, 30d supply, fill #1

## 2023-11-16 MED ORDER — HYDROCHLOROTHIAZIDE 25 MG PO TABS
25.0000 mg | ORAL_TABLET | Freq: Every day | ORAL | 11 refills | Status: AC
  Filled 2023-12-12 – 2024-01-25 (×2): qty 30, 30d supply, fill #0
  Filled 2024-02-19: qty 30, 30d supply, fill #1
  Filled 2024-04-12: qty 30, 30d supply, fill #2
  Filled 2024-04-12: qty 30, 30d supply, fill #0
  Filled 2024-08-07: qty 30, 30d supply, fill #1

## 2023-11-16 MED ORDER — BUPRENORPHINE ER 300 MG/1.5ML ~~LOC~~ SOSY
300.0000 mg | PREFILLED_SYRINGE | Freq: Once | SUBCUTANEOUS | Status: AC
  Administered 2023-11-16: 300 mg via SUBCUTANEOUS

## 2023-11-16 MED ORDER — SERTRALINE HCL 100 MG PO TABS
200.0000 mg | ORAL_TABLET | Freq: Every day | ORAL | 3 refills | Status: DC
  Filled 2023-11-16: qty 60, 30d supply, fill #0
  Filled 2023-11-16: qty 180, 90d supply, fill #0
  Filled 2023-12-06: qty 60, 30d supply, fill #1
  Filled 2024-01-25: qty 60, 30d supply, fill #2
  Filled 2024-02-06 (×4): qty 60, 30d supply, fill #3
  Filled 2024-02-19 – 2024-02-20 (×2): qty 60, 30d supply, fill #4
  Filled 2024-03-07 – 2024-03-08 (×3): qty 60, 30d supply, fill #5
  Filled 2024-04-12 (×3): qty 60, 30d supply, fill #6

## 2023-11-16 MED ORDER — ARIPIPRAZOLE 5 MG PO TABS
5.0000 mg | ORAL_TABLET | Freq: Every day | ORAL | 1 refills | Status: DC
  Filled 2023-11-16: qty 30, 30d supply, fill #0
  Filled 2023-12-01 – 2023-12-06 (×3): qty 30, 30d supply, fill #1

## 2023-11-16 MED ORDER — SERTRALINE HCL 100 MG PO TABS
100.0000 mg | ORAL_TABLET | Freq: Every day | ORAL | 1 refills | Status: DC
  Filled 2023-11-16 (×2): qty 30, 30d supply, fill #0
  Filled 2023-12-12 – 2024-01-09 (×2): qty 30, 30d supply, fill #1

## 2023-11-16 NOTE — Assessment & Plan Note (Signed)
Staying strong despite some very difficult personal circumstances, refill sent for zoloft.
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
## 2023-11-16 NOTE — Assessment & Plan Note (Signed)
Transitioned to sublocade today, given initial 300 mg dose. Will see back in 4 weeks for second dose and then switch to maintenance 100 mg dose thereafter.

## 2023-11-17 ENCOUNTER — Other Ambulatory Visit: Payer: Self-pay

## 2023-11-21 LAB — TOXASSURE FLEX 15, UR
6-ACETYLMORPHINE IA: NEGATIVE ng/mL
7-aminoclonazepam: NOT DETECTED ng/mg{creat}
AMPHETAMINES IA: NEGATIVE ng/mL
Alpha-hydroxyalprazolam: NOT DETECTED ng/mg{creat}
Alpha-hydroxymidazolam: NOT DETECTED ng/mg{creat}
Alpha-hydroxytriazolam: NOT DETECTED ng/mg{creat}
Alprazolam: NOT DETECTED ng/mg{creat}
BARBITURATES IA: NEGATIVE ng/mL
BUPRENORPHINE: POSITIVE
Benzodiazepines: NEGATIVE
Buprenorphine: 332 ng/mg{creat}
CANNABINOIDS IA: NEGATIVE ng/mL
COCAINE METABOLITE IA: NEGATIVE ng/mL
Clonazepam: NOT DETECTED ng/mg{creat}
Creatinine: 182 mg/dL
Desalkylflurazepam: NOT DETECTED ng/mg{creat}
Desmethyldiazepam: NOT DETECTED ng/mg{creat}
Desmethylflunitrazepam: NOT DETECTED ng/mg{creat}
Diazepam: NOT DETECTED ng/mg{creat}
ETHYL ALCOHOL Enzymatic: NEGATIVE g/dL
FENTANYL: NEGATIVE
Fentanyl: NOT DETECTED ng/mg{creat}
Flunitrazepam: NOT DETECTED ng/mg{creat}
Lorazepam: NOT DETECTED ng/mg{creat}
METHADONE IA: NEGATIVE ng/mL
METHADONE MTB IA: NEGATIVE ng/mL
Midazolam: NOT DETECTED ng/mg{creat}
Norbuprenorphine: >549 ng/mg{creat}
Norfentanyl: NOT DETECTED ng/mg{creat}
OPIATE CLASS IA: NEGATIVE ng/mL
OXYCODONE CLASS IA: NEGATIVE ng/mL
Oxazepam: NOT DETECTED ng/mg{creat}
PHENCYCLIDINE IA: NEGATIVE ng/mL
TAPENTADOL, IA: NEGATIVE ng/mL
TRAMADOL IA: NEGATIVE ng/mL
Temazepam: NOT DETECTED ng/mg{creat}

## 2023-11-24 ENCOUNTER — Other Ambulatory Visit (HOSPITAL_COMMUNITY): Payer: Self-pay

## 2023-11-24 ENCOUNTER — Other Ambulatory Visit: Payer: Self-pay

## 2023-11-24 ENCOUNTER — Other Ambulatory Visit: Payer: Self-pay | Admitting: Family Medicine

## 2023-11-27 ENCOUNTER — Other Ambulatory Visit (HOSPITAL_COMMUNITY): Payer: Self-pay

## 2023-11-28 ENCOUNTER — Other Ambulatory Visit: Payer: Self-pay

## 2023-11-28 ENCOUNTER — Telehealth: Payer: Self-pay | Admitting: Family Medicine

## 2023-11-28 ENCOUNTER — Other Ambulatory Visit (HOSPITAL_COMMUNITY): Payer: Self-pay

## 2023-11-28 DIAGNOSIS — F1191 Opioid use, unspecified, in remission: Secondary | ICD-10-CM

## 2023-11-28 MED ORDER — BUPRENORPHINE HCL-NALOXONE HCL 8-2 MG SL FILM
1.0000 | ORAL_FILM | Freq: Three times a day (TID) | SUBLINGUAL | 0 refills | Status: DC
  Filled 2023-11-28 (×3): qty 21, 7d supply, fill #0

## 2023-11-28 NOTE — Telephone Encounter (Signed)
Patient called in requesting more medication, can  be reached out [phone].

## 2023-11-28 NOTE — Telephone Encounter (Signed)
Patient previously reported to me needing to supplement with SL buprenorphine as recently given sublocade has not been adequately treating her withdrawal symptoms. Tried to reach her by phone but went straight to voicemail. Will send one week refill as last rx was filled 10/23/23, can send more pending when I get in touch with her. I left her a voicemail outlining this plan.

## 2023-11-29 ENCOUNTER — Other Ambulatory Visit: Payer: Self-pay

## 2023-11-30 ENCOUNTER — Other Ambulatory Visit: Payer: Self-pay

## 2023-11-30 MED ORDER — VITAMIN D (ERGOCALCIFEROL) 50000 UNITS PO CAPS
ORAL_CAPSULE | ORAL | 1 refills | Status: AC
  Filled 2023-11-30: qty 17, 90d supply, fill #0
  Filled 2024-04-12: qty 12, 84d supply, fill #0
  Filled 2024-04-12: qty 12, 84d supply, fill #1
  Filled 2024-04-12: qty 17, 90d supply, fill #1

## 2023-12-01 ENCOUNTER — Other Ambulatory Visit: Payer: Self-pay

## 2023-12-04 ENCOUNTER — Other Ambulatory Visit: Payer: Self-pay

## 2023-12-06 ENCOUNTER — Other Ambulatory Visit: Payer: Self-pay

## 2023-12-07 ENCOUNTER — Other Ambulatory Visit (HOSPITAL_COMMUNITY): Payer: Self-pay

## 2023-12-07 ENCOUNTER — Other Ambulatory Visit: Payer: Self-pay | Admitting: Family Medicine

## 2023-12-07 ENCOUNTER — Other Ambulatory Visit: Payer: Self-pay

## 2023-12-07 DIAGNOSIS — F1191 Opioid use, unspecified, in remission: Secondary | ICD-10-CM

## 2023-12-07 MED ORDER — SUBLOCADE 300 MG/1.5ML ~~LOC~~ SOSY
300.0000 mg | PREFILLED_SYRINGE | Freq: Once | SUBCUTANEOUS | 0 refills | Status: AC
  Filled 2023-12-07: qty 1.5, 28d supply, fill #0

## 2023-12-07 NOTE — Progress Notes (Signed)
 Specialty Pharmacy Refill Coordination Note  Kathy Bird is a 38 y.o. female contacted today regarding refills of specialty medication(s) Buprenorphine  (Sublocade )   Patient requested Courier to Provider Office   Delivery date: 12/11/23   Verified address: [facility]  [address]   Medication will be filled on 2/7.   Same day courier and signature required

## 2023-12-07 NOTE — Progress Notes (Signed)
 Sublocade order for visit next week

## 2023-12-08 ENCOUNTER — Other Ambulatory Visit: Payer: Self-pay

## 2023-12-08 MED ORDER — SERTRALINE HCL 100 MG PO TABS
150.0000 mg | ORAL_TABLET | Freq: Every day | ORAL | 0 refills | Status: DC
  Filled 2023-12-08 – 2023-12-29 (×3): qty 45, 30d supply, fill #0

## 2023-12-08 MED ORDER — ARIPIPRAZOLE 10 MG PO TABS
10.0000 mg | ORAL_TABLET | Freq: Every day | ORAL | 0 refills | Status: DC
  Filled 2023-12-08: qty 30, 30d supply, fill #0

## 2023-12-08 MED ORDER — LAMOTRIGINE 25 MG PO TABS
ORAL_TABLET | ORAL | 0 refills | Status: DC
  Filled 2023-12-08: qty 60, 37d supply, fill #0

## 2023-12-09 ENCOUNTER — Other Ambulatory Visit (HOSPITAL_COMMUNITY): Payer: Self-pay

## 2023-12-11 ENCOUNTER — Other Ambulatory Visit: Payer: Self-pay

## 2023-12-12 ENCOUNTER — Other Ambulatory Visit: Payer: Self-pay

## 2023-12-12 ENCOUNTER — Ambulatory Visit (INDEPENDENT_AMBULATORY_CARE_PROVIDER_SITE_OTHER): Payer: Medicaid Other | Admitting: Family Medicine

## 2023-12-12 ENCOUNTER — Encounter: Payer: Self-pay | Admitting: Family Medicine

## 2023-12-12 ENCOUNTER — Other Ambulatory Visit (HOSPITAL_COMMUNITY): Payer: Self-pay

## 2023-12-12 VITALS — BP 133/95 | HR 92 | Wt 195.4 lb

## 2023-12-12 DIAGNOSIS — F1191 Opioid use, unspecified, in remission: Secondary | ICD-10-CM | POA: Diagnosis not present

## 2023-12-12 MED ORDER — BUPRENORPHINE HCL-NALOXONE HCL 8-2 MG SL FILM
1.0000 | ORAL_FILM | Freq: Two times a day (BID) | SUBLINGUAL | 0 refills | Status: DC
  Filled 2023-12-12: qty 60, 30d supply, fill #0

## 2023-12-12 MED ORDER — BUPRENORPHINE ER 300 MG/1.5ML ~~LOC~~ SOSY
300.0000 mg | PREFILLED_SYRINGE | Freq: Once | SUBCUTANEOUS | Status: AC
  Administered 2023-12-12: 300 mg via SUBCUTANEOUS

## 2023-12-12 NOTE — Progress Notes (Signed)
Met with Kathy Bird today at [facility] for ongoing substance exposed newborn consult. We discussed her ongoing stressors of custody of her son and things she is doing to advocate for her mental health. Daughter Babette Relic) is doing well and meeting all developmental milestones.   Dennison Bulla, NNP-BC

## 2023-12-12 NOTE — Progress Notes (Signed)
Kathy Bird is a 38 y.o. [id number] here today for OUD follow up.  Given first dose of sublocade last visit Needed to use SL suboxone on top for a bit afterwards but reported she liked the shot overall Continues to need about two 8 mg films daily Would like to continue today  Health Maintenance Due  Topic Date Due   COVID-19 Vaccine (1) Never done   INFLUENZA VACCINE  06/01/2023    Past Medical History:  Diagnosis Date   Anxiety    Bipolar disorder (HCC)    Depression    Pregnancy induced hypertension    Schizophrenia (HCC)    Seizures (HCC)    as child. last seizure when 48yrs old    Past Surgical History:  Procedure Laterality Date   ADENOIDECTOMY      The following portions of the patient's history were reviewed and updated as appropriate: allergies, current medications, past family history, past medical history, past social history, past surgical history and problem list.   Health Maintenance:   Last pap: Lab Results  Component Value Date   DIAGPAP  05/26/2022    - Negative for Intraepithelial Lesions or Malignancy (NILM)   DIAGPAP - Benign reactive/reparative changes 05/26/2022   HPVHIGH Negative 05/26/2022     Last mammogram:  N/a    Hepatitis serologies: No results found for: "HAV", "HEPAIGM", "HEPBIGM", "HEPBCAB", "HBEAG", "HEPCAB"  Hep A Immunization:   Hep B Immunization:   Last LFTs: Lab Results  Component Value Date   ALT 29 05/04/2022   AST 24 05/04/2022   ALKPHOS 66 05/04/2022   BILITOT 0.5 05/04/2022     Review of Systems:  Pertinent items noted in HPI and remainder of comprehensive ROS otherwise negative.  Physical Exam:  BP (!) 133/95   Pulse 92   Wt 195 lb 6.4 oz (88.6 kg)   Breastfeeding No   BMI 32.52 kg/m  CONSTITUTIONAL: Well-developed, well-nourished female in no acute distress.  HEENT:  Normocephalic, atraumatic. External right and left ear normal. No scleral icterus.  NECK: Normal range of motion, supple, no masses  noted on observation SKIN: No rash noted. Not diaphoretic. No erythema. No pallor. MUSCULOSKELETAL: Normal range of motion. No edema noted. NEUROLOGIC: Alert and oriented to person, place, and time. Normal muscle tone coordination.  PSYCHIATRIC: Normal mood and affect. Normal behavior. Normal judgment and thought content. RESPIRATORY: Effort normal, no problems with respiration noted  Labs and Imaging I have reviewed the PDMP during this encounter.    Last UDS: Date 11/16/2023 Results :   No results found for this or any previous visit (from the past week). No results found.      Assessment and Plan:    Opioid use disorder in remission Assessment & Plan: Given second 300 mg dose today. Ok to continue supplementing, hopefully with second dose need will go down, refill for films sent. Also discussed possibility of remaining at 300 mg dose if necessary. UDS collected today. Plan to repeat CMP at next visit as last done 04/2022.   Orders: -     Buprenorphine ER -     ToxAssure Flex 15, Ur -     Buprenorphine HCl-Naloxone HCl; Place 1 Film under the tongue in the morning and at bedtime.  Dispense: 60 Film; Refill: 0     Return in about 4 weeks (around 01/09/2024) for REACH clinic.    Total face-to-face time with patient: 15 minutes.  Over 50% of encounter was spent on counseling and  coordination of care.   Venora Maples, MD/MPH Attending Family Medicine Physician, [facility] for [facility], [facility]

## 2023-12-12 NOTE — Assessment & Plan Note (Addendum)
Given second 300 mg dose today. Ok to continue supplementing, hopefully with second dose need will go down, refill for films sent. Also discussed possibility of remaining at 300 mg dose if necessary. UDS collected today. Plan to repeat CMP at next visit as last done 04/2022.

## 2023-12-14 LAB — TOXASSURE FLEX 15, UR
6-ACETYLMORPHINE IA: NEGATIVE ng/mL
7-aminoclonazepam: NOT DETECTED ng/mg{creat}
AMPHETAMINES IA: NEGATIVE ng/mL
Alpha-hydroxyalprazolam: NOT DETECTED ng/mg{creat}
Alpha-hydroxymidazolam: NOT DETECTED ng/mg{creat}
Alpha-hydroxytriazolam: NOT DETECTED ng/mg{creat}
Alprazolam: NOT DETECTED ng/mg{creat}
BARBITURATES IA: NEGATIVE ng/mL
BUPRENORPHINE: POSITIVE
Benzodiazepines: NEGATIVE
Buprenorphine: 150 ng/mg{creat}
CANNABINOIDS IA: NEGATIVE ng/mL
COCAINE METABOLITE IA: NEGATIVE ng/mL
Clonazepam: NOT DETECTED ng/mg{creat}
Creatinine: 104 mg/dL
Desalkylflurazepam: NOT DETECTED ng/mg{creat}
Desmethyldiazepam: NOT DETECTED ng/mg{creat}
Desmethylflunitrazepam: NOT DETECTED ng/mg{creat}
Diazepam: NOT DETECTED ng/mg{creat}
ETHYL ALCOHOL Enzymatic: NEGATIVE g/dL
FENTANYL: NEGATIVE
Fentanyl: NOT DETECTED ng/mg{creat}
Flunitrazepam: NOT DETECTED ng/mg{creat}
Lorazepam: NOT DETECTED ng/mg{creat}
METHADONE IA: NEGATIVE ng/mL
METHADONE MTB IA: NEGATIVE ng/mL
Midazolam: NOT DETECTED ng/mg{creat}
Norbuprenorphine: 194 ng/mg{creat}
Norfentanyl: NOT DETECTED ng/mg{creat}
OPIATE CLASS IA: NEGATIVE ng/mL
OXYCODONE CLASS IA: NEGATIVE ng/mL
Oxazepam: NOT DETECTED ng/mg{creat}
PHENCYCLIDINE IA: NEGATIVE ng/mL
TAPENTADOL, IA: NEGATIVE ng/mL
TRAMADOL IA: NEGATIVE ng/mL
Temazepam: NOT DETECTED ng/mg{creat}

## 2023-12-22 ENCOUNTER — Other Ambulatory Visit: Payer: Self-pay

## 2023-12-29 ENCOUNTER — Other Ambulatory Visit (HOSPITAL_COMMUNITY): Payer: Self-pay

## 2023-12-29 ENCOUNTER — Other Ambulatory Visit: Payer: Self-pay

## 2024-01-04 ENCOUNTER — Other Ambulatory Visit: Payer: Self-pay

## 2024-01-04 ENCOUNTER — Other Ambulatory Visit: Payer: Self-pay | Admitting: Family Medicine

## 2024-01-04 DIAGNOSIS — F1191 Opioid use, unspecified, in remission: Secondary | ICD-10-CM

## 2024-01-04 MED ORDER — SUBLOCADE 100 MG/0.5ML ~~LOC~~ SOSY
100.0000 mg | PREFILLED_SYRINGE | Freq: Once | SUBCUTANEOUS | 0 refills | Status: AC
  Filled 2024-01-05: qty 0.5, 30d supply, fill #0
  Filled 2024-01-05: qty 0.5, 1d supply, fill #0

## 2024-01-04 NOTE — Progress Notes (Signed)
 Sublocade refill rx

## 2024-01-05 ENCOUNTER — Other Ambulatory Visit: Payer: Self-pay

## 2024-01-05 NOTE — Progress Notes (Signed)
 Specialty Pharmacy Refill Coordination Note  Kathy Bird is a 38 y.o. female contacted today regarding refills of specialty medication(s) Buprenorphine [name])   Patient requested Courier to Provider Office   Delivery date: 01/08/24   Verified address: [facility] for [facility]  [address]   Medication will be filled on 3/10. Same day courier and signature required.

## 2024-01-06 ENCOUNTER — Other Ambulatory Visit (HOSPITAL_COMMUNITY): Payer: Self-pay

## 2024-01-08 ENCOUNTER — Other Ambulatory Visit: Payer: Self-pay

## 2024-01-09 ENCOUNTER — Ambulatory Visit (INDEPENDENT_AMBULATORY_CARE_PROVIDER_SITE_OTHER): Admitting: Family Medicine

## 2024-01-09 ENCOUNTER — Ambulatory Visit: Payer: Medicaid Other | Admitting: Family Medicine

## 2024-01-09 ENCOUNTER — Other Ambulatory Visit (HOSPITAL_COMMUNITY): Payer: Self-pay

## 2024-01-09 ENCOUNTER — Encounter: Payer: Self-pay | Admitting: Family Medicine

## 2024-01-09 ENCOUNTER — Other Ambulatory Visit: Payer: Self-pay

## 2024-01-09 DIAGNOSIS — F1191 Opioid use, unspecified, in remission: Secondary | ICD-10-CM | POA: Diagnosis not present

## 2024-01-09 MED ORDER — BUPRENORPHINE HCL-NALOXONE HCL 8-2 MG SL SUBL
1.0000 | SUBLINGUAL_TABLET | Freq: Two times a day (BID) | SUBLINGUAL | 0 refills | Status: DC
  Filled 2024-01-09: qty 60, 30d supply, fill #0

## 2024-01-09 MED ORDER — BUPRENORPHINE ER 100 MG/0.5ML ~~LOC~~ SOSY
100.0000 mg | PREFILLED_SYRINGE | Freq: Once | SUBCUTANEOUS | Status: AC
  Administered 2024-01-09: 100 mg via SUBCUTANEOUS

## 2024-01-09 NOTE — Progress Notes (Signed)
    Kathy Bird is a 38 y.o. [id number] here today for third Sublocade injection. Taking Suboxone films 8-2mg  BID tpo control withdrawal Sx.     Health Maintenance Due  Topic Date Due   COVID-19 Vaccine (1) Never done   INFLUENZA VACCINE  06/01/2023    Past Medical History:  Diagnosis Date   Anxiety    Bipolar disorder (HCC)    Depression    Pregnancy induced hypertension    Schizophrenia (HCC)    Seizures (HCC)    as child. last seizure when 80yrs old    Past Surgical History:  Procedure Laterality Date   ADENOIDECTOMY      The following portions of the patient's history were reviewed and updated as appropriate: allergies, current medications, past family history, past medical history, past social history, past surgical history and problem list.   Health Maintenance:   Last pap:  Result Date Procedure Results Follow-ups  05/26/2022 Cytology - PAP( [place]) High risk HPV: Negative Neisseria Gonorrhea: Negative Chlamydia: Negative Adequacy: Satisfactory for evaluation; transformation zone component PRESENT. Diagnosis: - Negative for Intraepithelial Lesions or Malignancy (NILM) Diagnosis: - Benign reactive/reparative changes Comment: Normal Reference Ranger Chlamydia - Negative Comment: Normal Reference Range Neisseria Gonorrhea - Negative Comment: Normal Reference Range HPV - Negative      Hepatitis serologies: No results found for: "HAV", "HEPAIGM", "HEPBIGM", "HEPBCAB", "HBEAG", "HEPCAB"   Last LFTs: Lab Results  Component Value Date   ALT 29 05/04/2022   AST 24 05/04/2022   ALKPHOS 66 05/04/2022   BILITOT 0.5 05/04/2022     Review of Systems:  Pertinent items noted in HPI and remainder of comprehensive ROS otherwise negative.  Physical Exam:  BP 117/77   Pulse 79   Wt 197 lb 6 oz (89.5 kg)   BMI 32.84 kg/m  CONSTITUTIONAL: Well-developed, well-nourished female in no acute distress.  HEENT:  Normocephalic, atraumatic. External right and left ear  normal. No scleral icterus.  NECK: Normal range of motion, supple, no masses noted on observation SKIN: No rash noted. Not diaphoretic. No erythema. No pallor. MUSCULOSKELETAL: Normal range of motion. No edema noted. NEUROLOGIC: Alert and oriented to person, place, and time. Normal muscle tone coordination.  PSYCHIATRIC: Normal mood and affect. Normal behavior. Normal judgment and thought content. RESPIRATORY: Effort normal, no problems with respiration noted ABDOMEN: No masses noted. No other overt distention noted.   PELVIC: Deferred  Labs and Imaging 12/12/23   Last UDS: Lab Results  Component Value Date   CREATIUR 104 12/12/2023    No results found for this or any previous visit (from the past week). No results found.     2 cc's 1% Lidocaine injected at site prior to [facility] injection by CNM. Sublocade injected my [employer] fundraiser. Pt tolerated will.   Assessment and Plan:   Problem List Items Addressed This Visit       Other   Opioid use disorder in remission  - Refill Suboxone sent to pharmacy. Hope to be able to reduce need.     Return in about 1 month (around 02/09/2024) for Sublocade Injection.    Total face-to-face time with patient: 10 minutes.  Over 50% of encounter was spent on counseling and coordination of care.  [place], [place] 01/09/2024 6:26 PM

## 2024-01-10 ENCOUNTER — Other Ambulatory Visit: Payer: Self-pay

## 2024-01-11 ENCOUNTER — Encounter: Admitting: Family Medicine

## 2024-01-11 LAB — TOXASSURE FLEX 15, UR
6-ACETYLMORPHINE IA: NEGATIVE ng/mL
7-aminoclonazepam: NOT DETECTED ng/mg{creat}
AMPHETAMINES IA: NEGATIVE ng/mL
Alpha-hydroxyalprazolam: NOT DETECTED ng/mg{creat}
Alpha-hydroxymidazolam: NOT DETECTED ng/mg{creat}
Alpha-hydroxytriazolam: NOT DETECTED ng/mg{creat}
Alprazolam: NOT DETECTED ng/mg{creat}
BARBITURATES IA: NEGATIVE ng/mL
BUPRENORPHINE: POSITIVE
Benzodiazepines: NEGATIVE
Buprenorphine: 189 ng/mg{creat}
CANNABINOIDS IA: NEGATIVE ng/mL
COCAINE METABOLITE IA: NEGATIVE ng/mL
Clonazepam: NOT DETECTED ng/mg{creat}
Creatinine: 193 mg/dL
Desalkylflurazepam: NOT DETECTED ng/mg{creat}
Desmethyldiazepam: NOT DETECTED ng/mg{creat}
Desmethylflunitrazepam: NOT DETECTED ng/mg{creat}
Diazepam: NOT DETECTED ng/mg{creat}
ETHYL ALCOHOL Enzymatic: NEGATIVE g/dL
FENTANYL: NEGATIVE
Fentanyl: NOT DETECTED ng/mg{creat}
Flunitrazepam: NOT DETECTED ng/mg{creat}
Lorazepam: NOT DETECTED ng/mg{creat}
METHADONE IA: NEGATIVE ng/mL
METHADONE MTB IA: NEGATIVE ng/mL
Midazolam: NOT DETECTED ng/mg{creat}
Norbuprenorphine: 296 ng/mg{creat}
Norfentanyl: NOT DETECTED ng/mg{creat}
OPIATE CLASS IA: NEGATIVE ng/mL
OXYCODONE CLASS IA: NEGATIVE ng/mL
Oxazepam: NOT DETECTED ng/mg{creat}
PHENCYCLIDINE IA: NEGATIVE ng/mL
TAPENTADOL, IA: NEGATIVE ng/mL
TRAMADOL IA: NEGATIVE ng/mL
Temazepam: NOT DETECTED ng/mg{creat}

## 2024-01-17 ENCOUNTER — Other Ambulatory Visit: Payer: Self-pay

## 2024-01-25 ENCOUNTER — Other Ambulatory Visit: Payer: Self-pay

## 2024-01-30 ENCOUNTER — Other Ambulatory Visit: Payer: Self-pay

## 2024-01-30 ENCOUNTER — Other Ambulatory Visit: Payer: Self-pay | Admitting: Family Medicine

## 2024-01-30 DIAGNOSIS — F1191 Opioid use, unspecified, in remission: Secondary | ICD-10-CM

## 2024-01-30 MED ORDER — SUBLOCADE 100 MG/0.5ML ~~LOC~~ SOSY
100.0000 mg | PREFILLED_SYRINGE | Freq: Once | SUBCUTANEOUS | 0 refills | Status: DC
Start: 2024-01-30 — End: 2024-02-06
  Filled 2024-01-31: qty 0.5, 28d supply, fill #0

## 2024-01-30 NOTE — Progress Notes (Signed)
Sublocade refill

## 2024-01-31 ENCOUNTER — Other Ambulatory Visit: Payer: Self-pay

## 2024-01-31 NOTE — Progress Notes (Signed)
 Specialty Pharmacy Refill Coordination Note  DONELLA PASCARELLA is a 38 y.o. female contacted today regarding refills of specialty medication(s) Buprenorphine [name])   Patient requested Courier to Provider Office   Delivery date: 02/05/24   Verified address: [facility] for [facility]  [address]   Medication will be filled on 4/7.   Same day courier and signature required. Appointment is 4/8.

## 2024-02-01 ENCOUNTER — Other Ambulatory Visit: Payer: Self-pay

## 2024-02-02 ENCOUNTER — Other Ambulatory Visit: Payer: Self-pay

## 2024-02-03 ENCOUNTER — Other Ambulatory Visit (HOSPITAL_COMMUNITY): Payer: Self-pay

## 2024-02-05 ENCOUNTER — Other Ambulatory Visit: Payer: Self-pay

## 2024-02-05 ENCOUNTER — Other Ambulatory Visit (HOSPITAL_COMMUNITY): Payer: Self-pay

## 2024-02-06 ENCOUNTER — Other Ambulatory Visit (HOSPITAL_COMMUNITY): Payer: Self-pay | Admitting: Internal Medicine

## 2024-02-06 ENCOUNTER — Other Ambulatory Visit: Payer: Self-pay

## 2024-02-06 ENCOUNTER — Other Ambulatory Visit (HOSPITAL_COMMUNITY): Payer: Self-pay

## 2024-02-06 ENCOUNTER — Ambulatory Visit (INDEPENDENT_AMBULATORY_CARE_PROVIDER_SITE_OTHER): Admitting: Family Medicine

## 2024-02-06 ENCOUNTER — Encounter: Payer: Self-pay | Admitting: Family Medicine

## 2024-02-06 VITALS — BP 123/80 | HR 72 | Wt 201.8 lb

## 2024-02-06 DIAGNOSIS — M7989 Other specified soft tissue disorders: Secondary | ICD-10-CM

## 2024-02-06 DIAGNOSIS — F1191 Opioid use, unspecified, in remission: Secondary | ICD-10-CM | POA: Diagnosis not present

## 2024-02-06 DIAGNOSIS — R600 Localized edema: Secondary | ICD-10-CM | POA: Diagnosis not present

## 2024-02-06 MED ORDER — SUBLOCADE 300 MG/1.5ML ~~LOC~~ SOSY: 300.0000 mg | PREFILLED_SYRINGE | Freq: Once | SUBCUTANEOUS | 0 refills | Status: DC

## 2024-02-06 MED ORDER — BUPRENORPHINE HCL-NALOXONE HCL 8-2 MG SL SUBL
1.0000 | SUBLINGUAL_TABLET | Freq: Two times a day (BID) | SUBLINGUAL | 0 refills | Status: DC
  Filled 2024-02-06 (×2): qty 60, 30d supply, fill #0

## 2024-02-06 MED ORDER — SUBLOCADE 300 MG/1.5ML ~~LOC~~ SOSY
300.0000 mg | PREFILLED_SYRINGE | Freq: Once | SUBCUTANEOUS | 0 refills | Status: AC
Start: 2024-02-06 — End: 2024-02-21
  Filled 2024-02-06: qty 1.5, 1d supply, fill #0
  Filled 2024-02-20: qty 1.5, 28d supply, fill #0

## 2024-02-06 MED ORDER — BUPRENORPHINE ER 100 MG/0.5ML ~~LOC~~ SOSY
100.0000 mg | PREFILLED_SYRINGE | Freq: Once | SUBCUTANEOUS | Status: AC
  Administered 2024-02-06: 100 mg via SUBCUTANEOUS

## 2024-02-06 NOTE — Progress Notes (Signed)
 Kathy Bird is a 38 y.o. [id number] here today for OUD follow up.  Overall feels like she is still very much needing the BID suboxone daily on top of her sublocade to get relief from withdrawal symptoms Wondering if this continues what utility in injections is  Also reports significant leg swelling Has been present for the past four months, L>R Reports no changes in bowel habits No excessive frothing of urine   Health Maintenance Due  Topic Date Due   COVID-19 Vaccine (1) Never done    Past Medical History:  Diagnosis Date   Anxiety    Bipolar disorder (HCC)    Depression    Pregnancy induced hypertension    Schizophrenia (HCC)    Seizures (HCC)    as child. last seizure when 58yrs old    Past Surgical History:  Procedure Laterality Date   ADENOIDECTOMY      The following portions of the patient's history were reviewed and updated as appropriate: allergies, current medications, past family history, past medical history, past social history, past surgical history and problem list.   Health Maintenance:   Last pap:  Result Date Procedure Results Follow-ups  05/26/2022 Cytology - PAP( [place]) High risk HPV: Negative Neisseria Gonorrhea: Negative Chlamydia: Negative Adequacy: Satisfactory for evaluation; transformation zone component PRESENT. Diagnosis: - Negative for Intraepithelial Lesions or Malignancy (NILM) Diagnosis: - Benign reactive/reparative changes Comment: Normal Reference Ranger Chlamydia - Negative Comment: Normal Reference Range Neisseria Gonorrhea - Negative Comment: Normal Reference Range HPV - Negative     Last mammogram:  N/a    Hepatitis serologies: No results found for: "HAV", "HEPAIGM", "HEPBIGM", "HEPBCAB", "HBEAG", "HEPCAB"  Hep A Immunization: no serologies on file, collected today  Hep B Immunization: no serologies on file, collected today  Last LFTs: Lab Results  Component Value Date   ALT 29 05/04/2022   AST 24 05/04/2022    ALKPHOS 66 05/04/2022   BILITOT 0.5 05/04/2022     Review of Systems:  Pertinent items noted in HPI and remainder of comprehensive ROS otherwise negative.  Physical Exam:  BP 123/80   Pulse 72   Wt 201 lb 12.8 oz (91.5 kg)   Breastfeeding No   BMI 33.58 kg/m  CONSTITUTIONAL: Well-developed, well-nourished female in no acute distress.  HEENT:  Normocephalic, atraumatic. External right and left ear normal. No scleral icterus.  NECK: Normal range of motion, supple, no masses noted on observation SKIN: No rash noted. Not diaphoretic. No erythema. No pallor. MUSCULOSKELETAL: Normal range of motion. No edema noted. NEUROLOGIC: Alert and oriented to person, place, and time. Normal muscle tone coordination.  PSYCHIATRIC: Normal mood and affect. Normal behavior. Normal judgment and thought content. RESPIRATORY: Effort normal, no problems with respiration noted  Labs and Imaging I have reviewed the PDMP during this encounter.    Last UDS: Lab Results  Component Value Date   CREATIUR 193 01/09/2024       Assessment and Plan:   Problem List Items Addressed This Visit       Other   Lower extremity edema   Marked. No suspicion for intraabdominal mass or nephrotic syndrome on history. Suspect venous insufficiency, will send for lower extremity dopplers, further referral pending this result.       Relevant Orders   Comp Met (CMET)   VAS [place] LOWER EXTREMITY VENOUS (DVT)   Opioid use disorder in remission - Primary   Doing well overall but insufficient relief since switching to 100 mg maintenance dose  of sublocade. Looking at metabolite levels I am not surprised, they are definitely lower on sublocade. Discussed we can go back up to the 300 mg dose in 2-3 weeks (do not need to wait full four weeks) and see if she can wean off oral dose after 2-3 months, and if not we can discuss going back to just SL dosing.   Also without any Hep A or B immunity on file, will obtain that as  well as screening LFTs as these have also not been done recently.       Relevant Medications   buprenorphine ER 100 MG/0.5ML injection 100 mg (Start on 02/06/2024  2:30 PM)   buprenorphine-naloxone (SUBOXONE) 8-2 mg SUBL SL tablet   buprenorphine ER (SUBLOCADE) 300 MG/1.5ML injection   Other Relevant Orders   ToxAssure Flex 15, Ur   Comp Met (CMET)   Hepatitis B surface antibody,quantitative   Hepatitis A Ab, Total      Return in about 2 weeks (around 02/20/2024) for REACH clinic, OUD f/u.    Total face-to-face time with patient: 20 minutes.  Over 50% of encounter was spent on counseling and coordination of care.   Venora Maples, MD/MPH Attending Family Medicine Physician, [facility] for [facility], [facility]

## 2024-02-06 NOTE — Assessment & Plan Note (Signed)
 Doing well overall but insufficient relief since switching to 100 mg maintenance dose of sublocade. Looking at metabolite levels I am not surprised, they are definitely lower on sublocade. Discussed we can go back up to the 300 mg dose in 2-3 weeks (do not need to wait full four weeks) and see if she can wean off oral dose after 2-3 months, and if not we can discuss going back to just SL dosing.   Also without any Hep A or B immunity on file, will obtain that as well as screening LFTs as these have also not been done recently.

## 2024-02-06 NOTE — Assessment & Plan Note (Addendum)
 Marked. No suspicion for intraabdominal mass or nephrotic syndrome on history. Suspect venous insufficiency, will send for lower extremity dopplers, further referral pending this result.

## 2024-02-06 NOTE — Patient Instructions (Addendum)
 Compression socks.  [facility] [address] [place], [place] [address] Tel.[phone] [email or web address]

## 2024-02-07 ENCOUNTER — Ambulatory Visit (HOSPITAL_COMMUNITY)
Admission: RE | Admit: 2024-02-07 | Discharge: 2024-02-07 | Disposition: A | Discharge status: Home / Self Care | Source: Ambulatory Visit | Attending: Advanced Practice Midwife | Admitting: Advanced Practice Midwife

## 2024-02-07 DIAGNOSIS — R600 Localized edema: Secondary | ICD-10-CM

## 2024-02-07 LAB — COMPREHENSIVE METABOLIC PANEL WITH GFR
ALT: 20 IU/L (ref 0–32)
AST: 22 IU/L (ref 0–40)
Albumin: 3.8 g/dL — ABNORMAL LOW (ref 3.9–4.9)
Alkaline Phosphatase: 80 IU/L (ref 44–121)
BUN/Creatinine Ratio: 16 (ref 9–23)
BUN: 10 mg/dL (ref 6–20)
Bilirubin Total: 0.2 mg/dL (ref 0.0–1.2)
CO2: 24 mmol/L (ref 20–29)
Calcium: 9.1 mg/dL (ref 8.7–10.2)
Chloride: 105 mmol/L (ref 96–106)
Creatinine, Ser: 0.61 mg/dL (ref 0.57–1.00)
Globulin, Total: 3.5 g/dL (ref 1.5–4.5)
Glucose: 91 mg/dL (ref 70–99)
Potassium: 4.4 mmol/L (ref 3.5–5.2)
Sodium: 140 mmol/L (ref 134–144)
Total Protein: 7.3 g/dL (ref 6.0–8.5)
eGFR: 119 mL/min/{1.73_m2} (ref >59)

## 2024-02-07 LAB — HEPATITIS A ANTIBODY, TOTAL: hep A Total Ab: NEGATIVE

## 2024-02-07 LAB — HEPATITIS B SURFACE ANTIBODY, QUANTITATIVE: Hepatitis B Surf Ab Quant: 24.1 m[IU]/mL

## 2024-02-08 LAB — TOXASSURE FLEX 15, UR
6-ACETYLMORPHINE IA: NEGATIVE ng/mL
7-aminoclonazepam: NOT DETECTED ng/mg{creat}
AMPHETAMINES IA: NEGATIVE ng/mL
Alpha-hydroxyalprazolam: NOT DETECTED ng/mg{creat}
Alpha-hydroxymidazolam: NOT DETECTED ng/mg{creat}
Alpha-hydroxytriazolam: NOT DETECTED ng/mg{creat}
Alprazolam: NOT DETECTED ng/mg{creat}
BARBITURATES IA: NEGATIVE ng/mL
BUPRENORPHINE: POSITIVE
Benzodiazepines: NEGATIVE
Buprenorphine: 172 ng/mg{creat}
CANNABINOIDS IA: NEGATIVE ng/mL
COCAINE METABOLITE IA: NEGATIVE ng/mL
Clonazepam: NOT DETECTED ng/mg{creat}
Creatinine: 130 mg/dL
Desalkylflurazepam: NOT DETECTED ng/mg{creat}
Desmethyldiazepam: NOT DETECTED ng/mg{creat}
Desmethylflunitrazepam: NOT DETECTED ng/mg{creat}
Diazepam: NOT DETECTED ng/mg{creat}
ETHYL ALCOHOL Enzymatic: NEGATIVE g/dL
FENTANYL: NEGATIVE
Fentanyl: NOT DETECTED ng/mg{creat}
Flunitrazepam: NOT DETECTED ng/mg{creat}
Lorazepam: NOT DETECTED ng/mg{creat}
METHADONE IA: NEGATIVE ng/mL
METHADONE MTB IA: NEGATIVE ng/mL
Midazolam: NOT DETECTED ng/mg{creat}
Norbuprenorphine: 475 ng/mg{creat}
Norfentanyl: NOT DETECTED ng/mg{creat}
OPIATE CLASS IA: NEGATIVE ng/mL
OXYCODONE CLASS IA: NEGATIVE ng/mL
Oxazepam: NOT DETECTED ng/mg{creat}
PHENCYCLIDINE IA: NEGATIVE ng/mL
TAPENTADOL, IA: NEGATIVE ng/mL
TRAMADOL IA: NEGATIVE ng/mL
Temazepam: NOT DETECTED ng/mg{creat}

## 2024-02-10 ENCOUNTER — Encounter: Payer: Self-pay | Admitting: Advanced Practice Midwife

## 2024-02-10 DIAGNOSIS — F1191 Opioid use, unspecified, in remission: Secondary | ICD-10-CM

## 2024-02-19 ENCOUNTER — Other Ambulatory Visit: Payer: Self-pay

## 2024-02-19 ENCOUNTER — Other Ambulatory Visit: Payer: Self-pay | Admitting: Family Medicine

## 2024-02-19 DIAGNOSIS — F1191 Opioid use, unspecified, in remission: Secondary | ICD-10-CM

## 2024-02-20 ENCOUNTER — Other Ambulatory Visit (HOSPITAL_COMMUNITY): Payer: Self-pay

## 2024-02-20 ENCOUNTER — Other Ambulatory Visit: Payer: Self-pay

## 2024-02-20 ENCOUNTER — Ambulatory Visit: Admitting: Family Medicine

## 2024-02-20 NOTE — Progress Notes (Signed)
 Specialty Pharmacy Refill Coordination Note  Kathy Bird is a 38 y.o. female assessed today regarding refills of clinic administered specialty medication(s) Buprenorphine  (Sublocade )   Clinic requested Courier to Provider Office   Delivery date: 02/20/24   Verified address: [facility] for [facility]  [address]   Medication will be filled on 02/20/24.

## 2024-02-20 NOTE — Progress Notes (Signed)
 Same day courier, signature required. Sending on 02/20/24   Control # [id number]

## 2024-02-20 NOTE — Progress Notes (Signed)
 Originally queued as [employer] in [place] in error, switched to same day signature required with CE.

## 2024-02-27 ENCOUNTER — Encounter: Payer: Self-pay | Admitting: Family Medicine

## 2024-02-27 ENCOUNTER — Other Ambulatory Visit: Payer: Self-pay

## 2024-02-27 ENCOUNTER — Ambulatory Visit: Admitting: Family Medicine

## 2024-02-27 VITALS — BP 130/87 | HR 76 | Wt 195.2 lb

## 2024-02-27 DIAGNOSIS — F1191 Opioid use, unspecified, in remission: Secondary | ICD-10-CM | POA: Diagnosis not present

## 2024-02-27 DIAGNOSIS — Z23 Encounter for immunization: Secondary | ICD-10-CM | POA: Diagnosis not present

## 2024-02-27 MED ORDER — BUPRENORPHINE HCL-NALOXONE HCL 8-2 MG SL FILM
1.0000 | ORAL_FILM | Freq: Three times a day (TID) | SUBLINGUAL | 0 refills | Status: DC
Start: 2024-02-27 — End: 2024-03-26
  Filled 2024-02-27: qty 90, 30d supply, fill #0

## 2024-02-27 MED ORDER — BUPRENORPHINE ER 300 MG/1.5ML ~~LOC~~ SOSY
300.0000 mg | PREFILLED_SYRINGE | Freq: Once | SUBCUTANEOUS | Status: AC
  Administered 2024-02-27: 300 mg via SUBCUTANEOUS

## 2024-02-27 NOTE — Assessment & Plan Note (Signed)
 Non immune on labs from last visit, recommended she get vaccinated at [employer] pharmacy, health department, or PCP office.

## 2024-02-27 NOTE — Assessment & Plan Note (Signed)
 Has needed to use BID to TID SL suboxone  to control symptoms while on 100 mg sublocade  dose since last visit. Bumped her back to 300 mg dose today, Ok to use on top for next few months until she reaches new steady state.

## 2024-02-27 NOTE — Progress Notes (Signed)
 Kathy Bird is a 38 y.o. [id number] here today for OUD follow up.  Last seen 02/06/2024, at that time reported insufficient control of symptoms on 100 mg maintenance sublocade , decision made to increase to 300 mg sooner rather than later UDS appropriate at that time Serologies showed immunity to hepatitis B but not hepatitis A   Health Maintenance Due  Topic Date Due   COVID-19 Vaccine (2 - 2024-25 season) 07/02/2023    Past Medical History:  Diagnosis Date   Anxiety    Bipolar disorder (HCC)    Depression    Pregnancy induced hypertension    Schizophrenia (HCC)    Seizures (HCC)    as child. last seizure when 55yrs old    Past Surgical History:  Procedure Laterality Date   ADENOIDECTOMY      The following portions of the patient's history were reviewed and updated as appropriate: allergies, current medications, past family history, past medical history, past social history, past surgical history and problem list.   Health Maintenance:   Last pap:  Result Date Procedure Results Follow-ups  05/26/2022 Cytology - PAP( ) High risk HPV: Negative Neisseria Gonorrhea: Negative Chlamydia: Negative Adequacy: Satisfactory for evaluation; transformation zone component PRESENT. Diagnosis: - Negative for Intraepithelial Lesions or Malignancy (NILM) Diagnosis: - Benign reactive/reparative changes Comment: Normal Reference Ranger Chlamydia - Negative Comment: Normal Reference Range Neisseria Gonorrhea - Negative Comment: Normal Reference Range HPV - Negative     Last mammogram:  N/a    Hepatitis serologies: Lab Results  Component Value Date   HAV Negative 02/06/2024    Hep A Immunization: *needs, refer*  Hep B Immunization: n/a, previously given or already immune  Last LFTs: Lab Results  Component Value Date   ALT 20 02/06/2024   AST 22 02/06/2024   ALKPHOS 80 02/06/2024   BILITOT 0.2 02/06/2024     Review of Systems:  Pertinent items noted in HPI  and remainder of comprehensive ROS otherwise negative.  Physical Exam:  BP 130/87   Pulse 76   Wt 195 lb 3.2 oz (88.5 kg)   BMI 32.48 kg/m  CONSTITUTIONAL: Well-developed, well-nourished female in no acute distress.  HEENT:  Normocephalic, atraumatic. External right and left ear normal. No scleral icterus.  NECK: Normal range of motion, supple, no masses noted on observation SKIN: No rash noted. Not diaphoretic. No erythema. No pallor. MUSCULOSKELETAL: Normal range of motion. No edema noted. NEUROLOGIC: Alert and oriented to person, place, and time. Normal muscle tone coordination.  PSYCHIATRIC: Normal mood and affect. Normal behavior. Normal judgment and thought content. RESPIRATORY: Effort normal, no problems with respiration noted  Labs and Imaging PDMP not reviewed this encounter.    Last UDS: Lab Results  Component Value Date   CREATIUR 130 02/06/2024     No results found for this or any previous visit (from the past week). VAS US  LOWER EXTREMITY VENOUS (DVT) Result Date: 02/07/2024  Lower Venous DVT Study Patient Name:  Kathy Bird  Date of Exam:   02/07/2024 Medical Rec #: [id number]     Accession #:    [id number] Date of Birth: 05/25/1986     Patient Gender: F Patient Age:   63 years Exam Location:  [facility] Procedure:      VAS US  LOWER EXTREMITY VENOUS (DVT) Referring Phys: [place]  SMITH --------------------------------------------------------------------------------  Indications: Swelling, and Edema.  Comparison Study: Previous study on the left lower extremity on 5.15.2024. Performing Technologist: Ria Chad  Examination Guidelines: A  complete evaluation includes B-mode imaging, spectral Doppler, color Doppler, and power Doppler as needed of all accessible portions of each vessel. Bilateral testing is considered an integral part of a complete examination. Limited examinations for reoccurring indications may be performed as noted. The reflux portion of the  exam is performed with the patient in reverse Trendelenburg.  +---------+---------------+---------+-----------+----------+--------------+ RIGHT    CompressibilityPhasicitySpontaneityPropertiesThrombus Aging +---------+---------------+---------+-----------+----------+--------------+ CFV      Full           Yes      Yes                                 +---------+---------------+---------+-----------+----------+--------------+ SFJ      Full           Yes      Yes                                 +---------+---------------+---------+-----------+----------+--------------+ FV Prox  Full                                                        +---------+---------------+---------+-----------+----------+--------------+ FV Mid   Full                                                        +---------+---------------+---------+-----------+----------+--------------+ FV DistalFull                                                        +---------+---------------+---------+-----------+----------+--------------+ PFV      Full                                                        +---------+---------------+---------+-----------+----------+--------------+ POP      Full           Yes      Yes                                 +---------+---------------+---------+-----------+----------+--------------+ PTV      Full                                                        +---------+---------------+---------+-----------+----------+--------------+ PERO     Full                                                        +---------+---------------+---------+-----------+----------+--------------+   +---------+---------------+---------+-----------+----------+--------------+  LEFT     CompressibilityPhasicitySpontaneityPropertiesThrombus Aging +---------+---------------+---------+-----------+----------+--------------+ CFV      Full           Yes      Yes                                  +---------+---------------+---------+-----------+----------+--------------+ SFJ      Full           Yes      Yes                                 +---------+---------------+---------+-----------+----------+--------------+ FV Prox  Full                                                        +---------+---------------+---------+-----------+----------+--------------+ FV Mid   Full                                                        +---------+---------------+---------+-----------+----------+--------------+ FV DistalFull                                                        +---------+---------------+---------+-----------+----------+--------------+ PFV      Full                                                        +---------+---------------+---------+-----------+----------+--------------+ POP      Full           Yes      Yes                                 +---------+---------------+---------+-----------+----------+--------------+ PTV      Full                                                        +---------+---------------+---------+-----------+----------+--------------+ PERO     Full                                                        +---------+---------------+---------+-----------+----------+--------------+     Summary: BILATERAL: - No evidence of deep vein thrombosis seen in the lower extremities, bilaterally. -No evidence of popliteal cyst, bilaterally.   *See table(s) above for measurements and observations. Electronically signed by Jimmye Moulds MD on 02/07/2024 at 5:16:50 PM.    Final  Assessment and Plan:   Problem List Items Addressed This Visit       Other   Need for vaccination against hepatitis A   Non immune on labs from last visit, recommended she get vaccinated at commercial pharmacy, health department, or PCP office.       Opioid use disorder in remission - Primary   Has needed to use BID to TID SL  suboxone  to control symptoms while on 100 mg sublocade  dose since last visit. Bumped her back to 300 mg dose today, Ok to use on top for next few months until she reaches new steady state.       Relevant Medications   buprenorphine  ER 300 MG/1.5ML injection 300 mg   Other Relevant Orders   ToxAssure Flex 15, Ur      Return in about 4 weeks (around 03/26/2024) for REACH clinic, sublocade .    Total face-to-face time with patient: 20 minutes.  Over 50% of encounter was spent on counseling and coordination of care.  No future appointments.  Teena Feast, MD/MPH Attending Family Medicine Physician, [facility] for [facility], [facility]

## 2024-02-28 ENCOUNTER — Other Ambulatory Visit: Payer: Self-pay

## 2024-02-29 ENCOUNTER — Other Ambulatory Visit: Payer: Self-pay

## 2024-03-01 LAB — TOXASSURE FLEX 15, UR
6-ACETYLMORPHINE IA: NEGATIVE ng/mL
7-aminoclonazepam: NOT DETECTED ng/mg{creat}
AMPHETAMINES IA: NEGATIVE ng/mL
Alpha-hydroxyalprazolam: NOT DETECTED ng/mg{creat}
Alpha-hydroxymidazolam: NOT DETECTED ng/mg{creat}
Alpha-hydroxytriazolam: NOT DETECTED ng/mg{creat}
Alprazolam: NOT DETECTED ng/mg{creat}
BARBITURATES IA: NEGATIVE ng/mL
BUPRENORPHINE: POSITIVE
Benzodiazepines: NEGATIVE
Buprenorphine: 234 ng/mg{creat}
CANNABINOIDS IA: NEGATIVE ng/mL
COCAINE METABOLITE IA: NEGATIVE ng/mL
Clonazepam: NOT DETECTED ng/mg{creat}
Creatinine: 124 mg/dL
Desalkylflurazepam: NOT DETECTED ng/mg{creat}
Desmethyldiazepam: NOT DETECTED ng/mg{creat}
Desmethylflunitrazepam: NOT DETECTED ng/mg{creat}
Diazepam: NOT DETECTED ng/mg{creat}
ETHYL ALCOHOL Enzymatic: NEGATIVE g/dL
FENTANYL: NEGATIVE
Fentanyl: NOT DETECTED ng/mg{creat}
Flunitrazepam: NOT DETECTED ng/mg{creat}
Lorazepam: NOT DETECTED ng/mg{creat}
METHADONE IA: NEGATIVE ng/mL
METHADONE MTB IA: NEGATIVE ng/mL
Midazolam: NOT DETECTED ng/mg{creat}
Norbuprenorphine: >806 ng/mg{creat}
Norfentanyl: NOT DETECTED ng/mg{creat}
OPIATE CLASS IA: NEGATIVE ng/mL
OXYCODONE CLASS IA: NEGATIVE ng/mL
Oxazepam: NOT DETECTED ng/mg{creat}
PHENCYCLIDINE IA: NEGATIVE ng/mL
TAPENTADOL, IA: NEGATIVE ng/mL
TRAMADOL IA: NEGATIVE ng/mL
Temazepam: NOT DETECTED ng/mg{creat}

## 2024-03-07 ENCOUNTER — Other Ambulatory Visit (HOSPITAL_COMMUNITY): Payer: Self-pay

## 2024-03-08 ENCOUNTER — Other Ambulatory Visit (HOSPITAL_COMMUNITY): Payer: Self-pay

## 2024-03-14 ENCOUNTER — Other Ambulatory Visit: Payer: Self-pay

## 2024-03-14 MED ORDER — ARIPIPRAZOLE 10 MG PO TABS
10.0000 mg | ORAL_TABLET | Freq: Every day | ORAL | 0 refills | Status: DC
  Filled 2024-03-14: qty 30, 30d supply, fill #0

## 2024-03-14 MED ORDER — SERTRALINE HCL 100 MG PO TABS
150.0000 mg | ORAL_TABLET | Freq: Every day | ORAL | 0 refills | Status: DC
  Filled 2024-03-14 – 2024-03-27 (×2): qty 45, 30d supply, fill #0

## 2024-03-14 MED ORDER — LAMOTRIGINE 25 MG PO TABS
ORAL_TABLET | ORAL | 0 refills | Status: DC
  Filled 2024-03-14: qty 60, 37d supply, fill #0

## 2024-03-14 MED ORDER — HYDROXYZINE HCL 25 MG PO TABS
25.0000 mg | ORAL_TABLET | Freq: Three times a day (TID) | ORAL | 0 refills | Status: DC | PRN
Start: 2024-03-14 — End: 2024-04-26
  Filled 2024-03-14: qty 42, 14d supply, fill #0

## 2024-03-19 ENCOUNTER — Other Ambulatory Visit: Payer: Self-pay

## 2024-03-19 ENCOUNTER — Other Ambulatory Visit: Payer: Self-pay | Admitting: Family Medicine

## 2024-03-19 DIAGNOSIS — F1191 Opioid use, unspecified, in remission: Secondary | ICD-10-CM

## 2024-03-19 MED ORDER — SUBLOCADE 300 MG/1.5ML ~~LOC~~ SOSY
300.0000 mg | PREFILLED_SYRINGE | Freq: Once | SUBCUTANEOUS | 0 refills | Status: AC
  Filled 2024-03-19: qty 1.5, 1d supply, fill #0
  Filled 2024-03-26: qty 1.5, 30d supply, fill #0

## 2024-03-19 NOTE — Progress Notes (Signed)
 Sublocade  orders

## 2024-03-26 ENCOUNTER — Other Ambulatory Visit: Payer: Self-pay | Admitting: Advanced Practice Midwife

## 2024-03-26 ENCOUNTER — Encounter: Payer: Self-pay | Admitting: Advanced Practice Midwife

## 2024-03-26 ENCOUNTER — Other Ambulatory Visit: Payer: Self-pay

## 2024-03-26 ENCOUNTER — Ambulatory Visit: Admitting: Family Medicine

## 2024-03-26 ENCOUNTER — Encounter: Payer: Self-pay | Admitting: Family Medicine

## 2024-03-26 DIAGNOSIS — F1191 Opioid use, unspecified, in remission: Secondary | ICD-10-CM

## 2024-03-26 DIAGNOSIS — Z5181 Encounter for therapeutic drug level monitoring: Secondary | ICD-10-CM | POA: Insufficient documentation

## 2024-03-26 MED ORDER — BUPRENORPHINE HCL-NALOXONE HCL 8-2 MG SL SUBL
1.0000 | SUBLINGUAL_TABLET | Freq: Three times a day (TID) | SUBLINGUAL | 0 refills | Status: DC
  Filled 2024-03-26: qty 90, 30d supply, fill #0

## 2024-03-26 MED ORDER — SUBOXONE 8-2 MG SL FILM
1.0000 | ORAL_FILM | Freq: Three times a day (TID) | SUBLINGUAL | 0 refills | Status: DC
  Filled 2024-03-26: qty 90, 30d supply, fill #0

## 2024-03-26 NOTE — Progress Notes (Signed)
 Specialty Pharmacy Refill Coordination Note  Kathy Bird is a 38 y.o. female contacted today regarding refills of specialty medication(s) Buprenorphine  (Sublocade )   Patient requested Courier to Provider Office   Delivery date: 03/28/24   Verified address: [facility] for [facility]  [address]   Medication will be filled on 5/29.   Same day courier and signature required.

## 2024-03-26 NOTE — Progress Notes (Signed)
 Pt unable to come to REACH Gyn appt today due to sick child. Rescheduled in 1 week for Sublocade . Also still needing Suboxone  TID to avoid withdrawal Sx. Increased Sublocade  to 300 mg last month.  Rx sent to pharmacy.       Kathy Bird  Benard Brackett 03/26/2024 2:53 PM

## 2024-03-27 ENCOUNTER — Other Ambulatory Visit: Payer: Self-pay

## 2024-03-28 ENCOUNTER — Other Ambulatory Visit: Payer: Self-pay

## 2024-03-29 ENCOUNTER — Other Ambulatory Visit: Payer: Self-pay

## 2024-04-02 ENCOUNTER — Ambulatory Visit: Admitting: Advanced Practice Midwife

## 2024-04-02 ENCOUNTER — Encounter: Payer: Self-pay | Admitting: Advanced Practice Midwife

## 2024-04-02 ENCOUNTER — Encounter: Payer: Self-pay | Admitting: Family Medicine

## 2024-04-02 VITALS — BP 133/77 | HR 71 | Ht 64.0 in | Wt 196.5 lb

## 2024-04-02 DIAGNOSIS — Z5181 Encounter for therapeutic drug level monitoring: Secondary | ICD-10-CM | POA: Diagnosis not present

## 2024-04-02 DIAGNOSIS — Z79891 Long term (current) use of opiate analgesic: Secondary | ICD-10-CM

## 2024-04-02 DIAGNOSIS — F1191 Opioid use, unspecified, in remission: Secondary | ICD-10-CM

## 2024-04-02 MED ORDER — BUPRENORPHINE ER 300 MG/1.5ML ~~LOC~~ SOSY
300.0000 mg | PREFILLED_SYRINGE | Freq: Once | SUBCUTANEOUS | Status: AC
  Administered 2024-04-02: 300 mg via SUBCUTANEOUS

## 2024-04-02 NOTE — Patient Instructions (Signed)
 I recommend that you get vaccinated against Hepatitis A at the Health Department.

## 2024-04-02 NOTE — Progress Notes (Signed)
   Subjective:    Kathy Bird is a 38 y.o. [id number] here today for ongoing substance exposed newborn consult.   Health Maintenance Due  Topic Date Due   COVID-19 Vaccine (2 - 2024-25 season) 07/02/2023    Past Medical History:  Diagnosis Date   Anxiety    Bipolar disorder (HCC)    Depression    Pregnancy induced hypertension    Schizophrenia (HCC)    Seizures (HCC)    as child. last seizure when 10yrs old    Past Surgical History:  Procedure Laterality Date   ADENOIDECTOMY      The following portions of the patient's history were reviewed and updated as appropriate: allergies, current medications, past family history, past medical history, past social history, past surgical history and problem list.     Objective:   Kathy Bird is well appearing in no acute distress. She has linear thinking and clear communication.      Assessment and Plan:  Met with Kathy Bird today at [facility] for ongoing substance exposed newborn consult. Infant (Kathy Bird) was not present for appointment today. We discussed her ongoing care and medication management. She stated that Kathy Bird is meeting all of her developmental milestones.    Encouraged Kathy Bird to contact [facility] consult phone in between appointments with any further questions or concerns.     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 Please refer to After Visit Summary for other counseling recommendations.   Return in about 4 weeks (around 04/30/2024) for REACH Sublocade  .    Total face-to-face time with patient: 15 minutes.  Over 50% of encounter was spent on counseling and coordination of care.   Kathy Bird, NNP-BC Neonatal Nurse Practitioner Substance Exposed Newborn Consult at the [facility] [phone]

## 2024-04-02 NOTE — Progress Notes (Signed)
 Kathy Bird is a 38 y.o. [id number] here today for Sublocade  injection. Still taking Suboxone  8-2 TID while on Sublocade  300 mg injection. Has not tried spacing out doses. Denies withdrawal Sx on current regimen. Reports being up all night with baby.    Health Maintenance Due  Topic Date Due   COVID-19 Vaccine (2 - 2024-25 season) 07/02/2023    Past Medical History:  Diagnosis Date   Anxiety    Bipolar disorder (HCC)    Depression    Pregnancy induced hypertension    Schizophrenia (HCC)    Seizures (HCC)    as child. last seizure when 73yrs old    Past Surgical History:  Procedure Laterality Date   ADENOIDECTOMY      The following portions of the patient's history were reviewed and updated as appropriate: allergies, current medications, past family history, past medical history, past social history, past surgical history and problem list.   Health Maintenance:   Last pap:  Result Date Procedure Results Follow-ups  05/26/2022 Cytology - PAP( [place]) High risk HPV: Negative Neisseria Gonorrhea: Negative Chlamydia: Negative Adequacy: Satisfactory for evaluation; transformation zone component PRESENT. Diagnosis: - Negative for Intraepithelial Lesions or Malignancy (NILM) Diagnosis: - Benign reactive/reparative changes Comment: Normal Reference Ranger Chlamydia - Negative Comment: Normal Reference Range Neisseria Gonorrhea - Negative Comment: Normal Reference Range HPV - Negative     Hepatitis serologies: Lab Results  Component Value Date   HAV Negative 02/06/2024    Hep A Immunization: Non-immune. Has not received vaccine.  Hep B Immunization: n/a, previously given or already immune  Last LFTs: Lab Results  Component Value Date   ALT 20 02/06/2024   AST 22 02/06/2024   ALKPHOS 80 02/06/2024   BILITOT 0.2 02/06/2024     Review of Systems:  Pertinent items noted in HPI and remainder of comprehensive ROS otherwise negative.  Physical Exam:  BP 133/77    Pulse 71   Ht 5\' 4"  (1.626 m)   Wt 196 lb 8 oz (89.1 kg)   Breastfeeding No   BMI 33.73 kg/m  CONSTITUTIONAL: Well-developed, well-nourished female in no acute distress. Drowsy. HEENT:  Normocephalic, atraumatic. External right and left ear normal. No scleral icterus.  NECK: Normal range of motion, supple, no masses noted on observation SKIN: No rash noted. Not diaphoretic. No erythema. No pallor. MUSCULOSKELETAL: Normal range of motion. No edema noted. NEUROLOGIC: Alert and oriented to person, place, and time. Normal muscle tone coordination.  PSYCHIATRIC: Normal mood and affect. Normal behavior. Normal judgment and thought content. RESPIRATORY: Effort normal, no problems with respiration noted ABDOMEN: No masses noted. No other overt distention noted.   PELVIC: Deferred  Labs and Imaging I have reviewed the PDMP during this encounter.   Last UDS: Lab Results  Component Value Date   CREATIUR 124 02/27/2024     No results found for this or any previous visit (from the past week). No results found.      Assessment and Plan:  1. Opioid use disorder in remission (Primary) - buprenorphine  ER 300 MG/1.5ML injection 300 mg - ToxAssure Flex 15, Ur  2. Encounter for monitoring Suboxone  maintenance therapy - Considering that pt has still needed TID Suboxone  while on Sublocade , we discussed whether she is a good candidate for Sublocade . CNM asked if pt if she had tried spacing out Suboxone  recently and if she experienced withdrawal Sx and she said she hadn't tried to space out Suboxone . CNM suggested she try and let us  know. -  Pt significantly drowsy at appt today. Reports being up much of the night with her child. CNM also wondered if meds soul be contributing. Will assess at [place]. Pt did not drive herself today fortunately.  Return in about 4 weeks (around 04/30/2024) for REACH Sublocade  .   Future Appointments  Date Time Provider Department Center  04/30/2024  3:35 PM Venetia Gilford , CNM [facility]    Total face-to-face time with patient: 15 minutes.  Over 50% of encounter was spent on counseling and coordination of care.   Mary Hockey  Benard Brackett 04/02/2024 3:35 PM Center for [employer], [facility]

## 2024-04-04 ENCOUNTER — Other Ambulatory Visit (HOSPITAL_COMMUNITY): Payer: Self-pay

## 2024-04-04 ENCOUNTER — Other Ambulatory Visit: Payer: Self-pay

## 2024-04-05 ENCOUNTER — Ambulatory Visit: Payer: Self-pay | Admitting: Advanced Practice Midwife

## 2024-04-05 LAB — TOXASSURE FLEX 15, UR
6-ACETYLMORPHINE IA: NEGATIVE ng/mL
7-aminoclonazepam: NOT DETECTED ng/mg{creat}
AMPHETAMINES IA: NEGATIVE ng/mL
Alpha-hydroxyalprazolam: NOT DETECTED ng/mg{creat}
Alpha-hydroxymidazolam: NOT DETECTED ng/mg{creat}
Alpha-hydroxytriazolam: NOT DETECTED ng/mg{creat}
Alprazolam: NOT DETECTED ng/mg{creat}
BARBITURATES IA: NEGATIVE ng/mL
BUPRENORPHINE: POSITIVE
Benzodiazepines: NEGATIVE
Buprenorphine: 270 ng/mg{creat}
CANNABINOIDS IA: NEGATIVE ng/mL
COCAINE METABOLITE IA: NEGATIVE ng/mL
Clonazepam: NOT DETECTED ng/mg{creat}
Creatinine: 119 mg/dL (ref >=20)
Desalkylflurazepam: NOT DETECTED ng/mg{creat}
Desmethyldiazepam: NOT DETECTED ng/mg{creat}
Desmethylflunitrazepam: NOT DETECTED ng/mg{creat}
Diazepam: NOT DETECTED ng/mg{creat}
ETHYL ALCOHOL Enzymatic: NEGATIVE g/dL
FENTANYL: NEGATIVE
Fentanyl: NOT DETECTED ng/mg{creat}
Flunitrazepam: NOT DETECTED ng/mg{creat}
Lorazepam: NOT DETECTED ng/mg{creat}
METHADONE IA: NEGATIVE ng/mL
METHADONE MTB IA: NEGATIVE ng/mL
Midazolam: NOT DETECTED ng/mg{creat}
Norbuprenorphine: >840 ng/mg{creat}
Norfentanyl: NOT DETECTED ng/mg{creat}
OPIATE CLASS IA: NEGATIVE ng/mL
OXYCODONE CLASS IA: NEGATIVE ng/mL
Oxazepam: NOT DETECTED ng/mg{creat}
PHENCYCLIDINE IA: NEGATIVE ng/mL
TAPENTADOL, IA: NEGATIVE ng/mL
TRAMADOL IA: NEGATIVE ng/mL
Temazepam: NOT DETECTED ng/mg{creat}

## 2024-04-12 ENCOUNTER — Other Ambulatory Visit: Payer: Self-pay

## 2024-04-12 ENCOUNTER — Other Ambulatory Visit (HOSPITAL_COMMUNITY): Payer: Self-pay

## 2024-04-15 ENCOUNTER — Other Ambulatory Visit (HOSPITAL_COMMUNITY): Payer: Self-pay

## 2024-04-15 MED ORDER — LAMOTRIGINE 25 MG PO TABS
25.0000 mg | ORAL_TABLET | Freq: Two times a day (BID) | ORAL | 0 refills | Status: DC
  Filled 2024-04-15 (×2): qty 60, 30d supply, fill #0

## 2024-04-17 ENCOUNTER — Other Ambulatory Visit: Payer: Self-pay | Admitting: Family Medicine

## 2024-04-17 ENCOUNTER — Other Ambulatory Visit (HOSPITAL_COMMUNITY): Payer: Self-pay

## 2024-04-17 ENCOUNTER — Other Ambulatory Visit: Payer: Self-pay

## 2024-04-17 DIAGNOSIS — F1191 Opioid use, unspecified, in remission: Secondary | ICD-10-CM

## 2024-04-22 ENCOUNTER — Encounter: Payer: Self-pay | Admitting: Advanced Practice Midwife

## 2024-04-23 ENCOUNTER — Other Ambulatory Visit: Payer: Self-pay

## 2024-04-23 ENCOUNTER — Other Ambulatory Visit: Payer: Self-pay | Admitting: Advanced Practice Midwife

## 2024-04-23 ENCOUNTER — Encounter: Payer: Self-pay | Admitting: Family Medicine

## 2024-04-23 DIAGNOSIS — Z79891 Long term (current) use of opiate analgesic: Secondary | ICD-10-CM

## 2024-04-23 DIAGNOSIS — F411 Generalized anxiety disorder: Secondary | ICD-10-CM

## 2024-04-23 DIAGNOSIS — F332 Major depressive disorder, recurrent severe without psychotic features: Secondary | ICD-10-CM

## 2024-04-23 MED ORDER — SERTRALINE HCL 100 MG PO TABS
200.0000 mg | ORAL_TABLET | Freq: Every day | ORAL | 3 refills | Status: DC
  Filled 2024-04-23 – 2024-04-30 (×2): qty 180, 90d supply, fill #0
  Filled 2024-06-24: qty 180, 90d supply, fill #1
  Filled 2024-06-25: qty 60, 30d supply, fill #1
  Filled 2024-07-08: qty 60, 30d supply, fill #0
  Filled 2024-07-25: qty 60, 30d supply, fill #1
  Filled 2024-08-08: qty 60, 30d supply, fill #2
  Filled 2024-08-26: qty 60, 30d supply, fill #3

## 2024-04-23 MED ORDER — BUPRENORPHINE HCL-NALOXONE HCL 8-2 MG SL FILM
1.0000 | ORAL_FILM | Freq: Three times a day (TID) | SUBLINGUAL | 0 refills | Status: DC
  Filled 2024-04-23 – 2024-04-24 (×2): qty 90, 30d supply, fill #0

## 2024-04-23 NOTE — Addendum Note (Signed)
 Addended by: BARBRA LANG PARAS on: 04/23/2024 05:16 PM   Modules accepted: Orders

## 2024-04-23 NOTE — Telephone Encounter (Signed)
 Pt left voicemail requesting refill for Sertraline  and Suboxone .  RN called [facility].  Per pharmacy staff, Sertraline  was last picked up on 04/12/24 and Suboxone  was filled last on 03/27/24.   Wadd[place], RN     04/23/24 1545 Returned pt call. She still has enough Sertaline for remainder of monthly period, she said she asked because she received a notification from her pharmacy about a refill.  Informed pt that pharmacy verified Suboxone  was last filled on 03/27/24 and that message has been sent to provider about refilling the prescription.  Pt verbalized understanding with no further questions.   Waddell, RN

## 2024-04-23 NOTE — Telephone Encounter (Signed)
 refilled

## 2024-04-24 ENCOUNTER — Other Ambulatory Visit: Payer: Self-pay

## 2024-04-24 NOTE — Telephone Encounter (Signed)
 Already taken care of

## 2024-04-25 ENCOUNTER — Other Ambulatory Visit (HOSPITAL_COMMUNITY): Payer: Self-pay

## 2024-04-26 ENCOUNTER — Other Ambulatory Visit: Payer: Self-pay

## 2024-04-29 ENCOUNTER — Other Ambulatory Visit: Payer: Self-pay

## 2024-04-30 ENCOUNTER — Other Ambulatory Visit: Payer: Self-pay

## 2024-04-30 ENCOUNTER — Ambulatory Visit: Admitting: Advanced Practice Midwife

## 2024-05-06 ENCOUNTER — Other Ambulatory Visit (HOSPITAL_COMMUNITY): Payer: Self-pay

## 2024-05-06 MED ORDER — LAMOTRIGINE 25 MG PO TABS
75.0000 mg | ORAL_TABLET | Freq: Every day | ORAL | 0 refills | Status: AC
  Filled 2024-05-06 – 2024-05-10 (×3): qty 90, 30d supply, fill #0

## 2024-05-08 ENCOUNTER — Other Ambulatory Visit (HOSPITAL_COMMUNITY): Payer: Self-pay

## 2024-05-10 ENCOUNTER — Other Ambulatory Visit: Payer: Self-pay

## 2024-05-10 ENCOUNTER — Other Ambulatory Visit (HOSPITAL_COMMUNITY): Payer: Self-pay

## 2024-05-13 ENCOUNTER — Other Ambulatory Visit (HOSPITAL_COMMUNITY): Payer: Self-pay

## 2024-05-16 ENCOUNTER — Other Ambulatory Visit: Payer: Self-pay

## 2024-05-21 ENCOUNTER — Other Ambulatory Visit (HOSPITAL_COMMUNITY): Payer: Self-pay

## 2024-05-21 ENCOUNTER — Ambulatory Visit: Admitting: Advanced Practice Midwife

## 2024-05-21 ENCOUNTER — Encounter: Payer: Self-pay | Admitting: Advanced Practice Midwife

## 2024-05-21 ENCOUNTER — Other Ambulatory Visit: Payer: Self-pay

## 2024-05-21 VITALS — BP 135/86 | HR 72 | Wt 201.6 lb

## 2024-05-21 DIAGNOSIS — F1191 Opioid use, unspecified, in remission: Secondary | ICD-10-CM

## 2024-05-21 DIAGNOSIS — Z79891 Long term (current) use of opiate analgesic: Secondary | ICD-10-CM

## 2024-05-21 DIAGNOSIS — Z5181 Encounter for therapeutic drug level monitoring: Secondary | ICD-10-CM

## 2024-05-21 DIAGNOSIS — F411 Generalized anxiety disorder: Secondary | ICD-10-CM

## 2024-05-21 MED ORDER — SUBLOCADE 300 MG/1.5ML ~~LOC~~ SOSY
300.0000 mg | PREFILLED_SYRINGE | Freq: Once | SUBCUTANEOUS | 0 refills | Status: AC
  Filled 2024-05-22: qty 1.5, 1d supply, fill #0
  Filled 2024-05-22: qty 1.5, 30d supply, fill #0

## 2024-05-21 MED ORDER — BUPRENORPHINE HCL-NALOXONE HCL 8-2 MG SL FILM
1.0000 | ORAL_FILM | Freq: Three times a day (TID) | SUBLINGUAL | 0 refills | Status: DC
  Filled 2024-05-21 – 2024-05-22 (×2): qty 90, 30d supply, fill #0

## 2024-05-21 NOTE — Progress Notes (Signed)
 Kathy Bird is a 38 y.o. [id number] here today for Suboxone  and Sublocade  management. Missed last dose Sublocade . Last dose 7 weeks ago. Had continued to feel like she needed 8 TID Suboxone  films on top of Sublocade  300 mg. Felt very bad for about two weeks after missing dose, but says she did not use any illicit substances. Wants to resume Sublocade .   Recently started on Lamictal  by Psychiatrist and feels like it is really working well for her. Reports leaving her job because she got very little sleep and was very stressed.     Health Maintenance Due  Topic Date Due   Hepatitis B Vaccines (1 of 3 - 19+ 3-dose series) Never done   HPV VACCINES (1 - 3-dose SCDM series) Never done   COVID-19 Vaccine (1 - 2024-25 season) Never done    Past Medical History:  Diagnosis Date   Anxiety    Bipolar disorder (HCC)    Depression    Pregnancy induced hypertension    Schizophrenia (HCC)    Seizures (HCC)    as child. last seizure when 81yrs old    Past Surgical History:  Procedure Laterality Date   ADENOIDECTOMY      The following portions of the patient's history were reviewed and updated as appropriate: allergies, current medications, past family history, past medical history, past social history, past surgical history and problem list.   Health Maintenance:   Last pap:  Result Date Procedure Results Follow-ups  05/26/2022 Cytology - PAP( [place]) High risk HPV: Negative Neisseria Gonorrhea: Negative Chlamydia: Negative Adequacy: Satisfactory for evaluation; transformation zone component PRESENT. Diagnosis: - Negative for Intraepithelial Lesions or Malignancy (NILM) Diagnosis: - Benign reactive/reparative changes Comment: Normal Reference Ranger Chlamydia - Negative Comment: Normal Reference Range Neisseria Gonorrhea - Negative Comment: Normal Reference Range HPV - Negative    \  Hepatitis serologies: Lab Results  Component Value Date   HAV Negative 02/06/2024    Last  LFTs: Lab Results  Component Value Date   ALT 20 02/06/2024   AST 22 02/06/2024   ALKPHOS 80 02/06/2024   BILITOT 0.2 02/06/2024     Review of Systems:  Pertinent items noted in HPI and remainder of comprehensive ROS otherwise negative.  Physical Exam:  BP 135/86   Pulse 72   Wt 201 lb 9.6 oz (91.4 kg)   BMI 34.60 kg/m  CONSTITUTIONAL: Well-developed, well-nourished female in no acute distress.  HEENT:  Normocephalic, atraumatic. External right and left ear normal. No scleral icterus.  NECK: Normal range of motion, supple, no masses noted on observation SKIN: No rash noted. Not diaphoretic. No erythema. No pallor. MUSCULOSKELETAL: Normal range of motion. No edema noted. NEUROLOGIC: Alert and oriented to person, place, and time. Normal muscle tone coordination.  PSYCHIATRIC: Normal mood and affect. Normal behavior. Normal judgment and thought content. RESPIRATORY: Effort normal, no problems with respiration noted ABDOMEN: Deferred PELVIC: Deferred  Labs and Imaging I have reviewed the PDMP during this encounter.   Last UDS: Lab Results  Component Value Date   CREATIUR 119 04/02/2024     Assessment and Plan:  1. Opioid use disorder in remission (Primary) - buprenorphine  ER (SUBLOCADE ) 300 MG/1.5ML injection; Inject 1.5 mLs (300 mg total) into the skin once for 1 dose.  Dispense: 1.5 mL; Refill: 0 - Buprenorphine  HCl-Naloxone  HCl (SUBOXONE ) 8-2 MG FILM; Place 1 Film under the tongue in the morning, at noon, and at bedtime.  Dispense: 90 Film; Refill: 0  2. Generalized anxiety  disorder - Managed by Psych - Lamictal  helping a lot.  3. Encounter for monitoring Suboxone  maintenance therapy - Lengthy conversation about stopping vs resuming Sublocade  and importance of coming to appointments consistently every 4-5 weeks to avoid withdrawal Sx and increased risk of illicit use or overdose. Pt strongly desires to resume Sublocade  and is committed to consistent appointments.  Also discussed that since the Sublocade  has worn off, when she resumes she probably won't need as much Suboxone  and should take into consideration that the total dose of Buprenorphine  will be be a big step up and possibly sedating.  - buprenorphine  ER (SUBLOCADE ) 300 MG/1.5ML injection; Inject 1.5 mLs (300 mg total) into the skin once for 1 dose.  Dispense: 1.5 mL; Refill: 0 - Buprenorphine  HCl-Naloxone  HCl (SUBOXONE ) 8-2 MG FILM; Place 1 Film under the tongue in the morning, at noon, and at bedtime.  Dispense: 90 Film; Refill: 0 - ToxAssure Flex 15, Ur   Return in about 1 week (around 05/28/2024) for REACH Sublocade .   Future Appointments  Date Time Provider Department Center  05/28/2024  3:55 PM Lola Donnice HERO, MD [facility]    Total face-to-face time with patient: 20 minutes.  Over 50% of encounter was spent on counseling and coordination of care.   Henleigh Robello  Claudene HOWARD 05/21/2024 5:41 PM Center for [employer] [facility], [facility]

## 2024-05-22 ENCOUNTER — Other Ambulatory Visit (HOSPITAL_COMMUNITY): Payer: Self-pay

## 2024-05-22 ENCOUNTER — Other Ambulatory Visit: Payer: Self-pay

## 2024-05-22 NOTE — Progress Notes (Signed)
 Specialty Pharmacy Refill Coordination Note  Kathy Bird is a 38 y.o. female assessed today regarding refills of clinic administered specialty medication(s) Buprenorphine  (Sublocade )   Clinic requested Courier to Provider Office   Delivery date: 05/23/24   Verified address: [facility]  [address]   Medication will be filled on 05/22/24.    Appointment 05/28/24.

## 2024-05-22 NOTE — Progress Notes (Deleted)
 Specialty Pharmacy Refill Coordination Note  Kathy Bird is a 38 y.o. female contacted today regarding refills of specialty medication(s) Buprenorphine  (Sublocade )   Patient requested Courier to Provider Office   Delivery date: 05/23/24   Verified address: [facility]  [address]   Medication will be filled on 05/22/24.

## 2024-05-23 ENCOUNTER — Other Ambulatory Visit: Payer: Self-pay

## 2024-05-23 NOTE — Progress Notes (Addendum)
 Medication was ordered and arrived on 05/23/24.  Same day courier to office 05/23/24.  Signature required.

## 2024-05-26 LAB — TOXASSURE FLEX 15, UR
6-ACETYLMORPHINE IA: NEGATIVE ng/mL
7-aminoclonazepam: NOT DETECTED ng/mg{creat}
AMPHETAMINES IA: NEGATIVE ng/mL
Alpha-hydroxyalprazolam: NOT DETECTED ng/mg{creat}
Alpha-hydroxymidazolam: NOT DETECTED ng/mg{creat}
Alpha-hydroxytriazolam: NOT DETECTED ng/mg{creat}
Alprazolam: NOT DETECTED ng/mg{creat}
BARBITURATES IA: NEGATIVE ng/mL
BUPRENORPHINE: POSITIVE
Benzodiazepines: NEGATIVE
Buprenorphine: 324 ng/mg{creat}
CANNABINOIDS IA: NEGATIVE ng/mL
COCAINE METABOLITE IA: NEGATIVE ng/mL
Clonazepam: NOT DETECTED ng/mg{creat}
Creatinine: 129 mg/dL (ref >=20)
Desalkylflurazepam: NOT DETECTED ng/mg{creat}
Desmethyldiazepam: NOT DETECTED ng/mg{creat}
Desmethylflunitrazepam: NOT DETECTED ng/mg{creat}
Diazepam: NOT DETECTED ng/mg{creat}
ETHYL ALCOHOL Enzymatic: NEGATIVE g/dL
FENTANYL: NEGATIVE
Fentanyl: NOT DETECTED ng/mg{creat}
Flunitrazepam: NOT DETECTED ng/mg{creat}
Lorazepam: NOT DETECTED ng/mg{creat}
METHADONE IA: NEGATIVE ng/mL
METHADONE MTB IA: NEGATIVE ng/mL
Midazolam: NOT DETECTED ng/mg{creat}
Norbuprenorphine: >775 ng/mg{creat}
Norfentanyl: NOT DETECTED ng/mg{creat}
OPIATE CLASS IA: NEGATIVE ng/mL
OXYCODONE CLASS IA: NEGATIVE ng/mL
Oxazepam: NOT DETECTED ng/mg{creat}
PHENCYCLIDINE IA: NEGATIVE ng/mL
TAPENTADOL, IA: NEGATIVE ng/mL
TRAMADOL IA: NEGATIVE ng/mL
Temazepam: NOT DETECTED ng/mg{creat}

## 2024-05-27 ENCOUNTER — Ambulatory Visit: Payer: Self-pay | Admitting: Advanced Practice Midwife

## 2024-05-27 DIAGNOSIS — F1191 Opioid use, unspecified, in remission: Secondary | ICD-10-CM

## 2024-05-27 DIAGNOSIS — Z5181 Encounter for therapeutic drug level monitoring: Secondary | ICD-10-CM

## 2024-05-28 ENCOUNTER — Other Ambulatory Visit (HOSPITAL_COMMUNITY): Payer: Self-pay

## 2024-05-28 ENCOUNTER — Other Ambulatory Visit: Payer: Self-pay

## 2024-05-28 ENCOUNTER — Ambulatory Visit: Admitting: Family Medicine

## 2024-05-28 VITALS — BP 122/83 | HR 82 | Wt 200.6 lb

## 2024-05-28 DIAGNOSIS — F332 Major depressive disorder, recurrent severe without psychotic features: Secondary | ICD-10-CM

## 2024-05-28 DIAGNOSIS — F1110 Opioid abuse, uncomplicated: Secondary | ICD-10-CM | POA: Diagnosis not present

## 2024-05-28 DIAGNOSIS — F1191 Opioid use, unspecified, in remission: Secondary | ICD-10-CM

## 2024-05-28 MED ORDER — HYDROXYZINE PAMOATE 25 MG PO CAPS
25.0000 mg | ORAL_CAPSULE | Freq: Four times a day (QID) | ORAL | 5 refills | Status: AC | PRN
  Filled 2024-05-28: qty 120, 30d supply, fill #0
  Filled 2024-08-08: qty 120, 30d supply, fill #1
  Filled 2024-11-13: qty 120, 30d supply, fill #2

## 2024-05-28 NOTE — Progress Notes (Unsigned)
 Kathy Bird is a 38 y.o. [id number] here today for OUD follow up.  Patient had rough patch but doing better now Had been on sublocade  previously but missed some doses Visit today to discuss maybe going back on Reflecting on it, patient never felt like she could go down much on her SL films despite being on sublocade   Health Maintenance Due  Topic Date Due   Hepatitis B Vaccines (1 of 3 - 19+ 3-dose series) Never done   HPV VACCINES (1 - 3-dose SCDM series) Never done   COVID-19 Vaccine (2 - 2024-25 season) 07/02/2023    Past Medical History:  Diagnosis Date   Anxiety    Bipolar disorder (HCC)    Depression    Pregnancy induced hypertension    Schizophrenia (HCC)    Seizures (HCC)    as child. last seizure when 68yrs old    Past Surgical History:  Procedure Laterality Date   ADENOIDECTOMY      The following portions of the patient's history were reviewed and updated as appropriate: allergies, current medications, past family history, past medical history, past social history, past surgical history and problem list.   Health Maintenance:   Last pap:  Result Date Procedure Results Follow-ups  05/26/2022 Cytology - PAP( [place]) High risk HPV: Negative Neisseria Gonorrhea: Negative Chlamydia: Negative Adequacy: Satisfactory for evaluation; transformation zone component PRESENT. Diagnosis: - Negative for Intraepithelial Lesions or Malignancy (NILM) Diagnosis: - Benign reactive/reparative changes Comment: Normal Reference Ranger Chlamydia - Negative Comment: Normal Reference Range Neisseria Gonorrhea - Negative Comment: Normal Reference Range HPV - Negative HPV testing in 5 years: due 05/27/2027 Pap in 5 years: due 05/27/2027    Last mammogram:  N/a   Hepatitis serologies: Lab Results  Component Value Date   HAV Negative 02/06/2024     Last LFTs: Lab Results  Component Value Date   ALT 20 02/06/2024   AST 22 02/06/2024   ALKPHOS 80 02/06/2024   BILITOT  0.2 02/06/2024     Review of Systems:  Pertinent items noted in HPI and remainder of comprehensive ROS otherwise negative.  Physical Exam:  BP 122/83   Pulse 82   Wt 200 lb 9.6 oz (91 kg)   BMI 34.43 kg/m  CONSTITUTIONAL: Well-developed, well-nourished female in no acute distress.  HEENT:  Normocephalic, atraumatic. External right and left ear normal. No scleral icterus.  NECK: Normal range of motion, supple, no masses noted on observation SKIN: No rash noted. Not diaphoretic. No erythema. No pallor. MUSCULOSKELETAL: Normal range of motion. No edema noted. NEUROLOGIC: Alert and oriented to person, place, and time. Normal muscle tone coordination.  PSYCHIATRIC: Normal mood and affect. Normal behavior. Normal judgment and thought content. RESPIRATORY: Effort normal, no problems with respiration noted  Labs and Imaging I have reviewed the PDMP during this encounter.    Last UDS: Lab Results  Component Value Date   CREATIUR 129 05/21/2024    Results for orders placed or performed in visit on 05/21/24 (from the past week)  ToxAssure Flex 15, Ur   Collection Time: 05/21/24  5:20 PM  Result Value Ref Range   Summary Report FINAL    Creatinine 129 >=20 mg/dL   AMPHETAMINES IA Negative CUTOFF:300 ng/mL   Benzodiazepines Negative    Diazepam Not Detected ng/mg creat   Desmethyldiazepam Not Detected ng/mg creat   Oxazepam Not Detected ng/mg creat   Temazepam Not Detected ng/mg creat   Alprazolam  Not Detected ng/mg creat   Alpha-hydroxyalprazolam Not  Detected ng/mg creat   Desalkylflurazepam Not Detected ng/mg creat   Lorazepam Not Detected ng/mg creat   Alpha-hydroxytriazolam Not Detected ng/mg creat   Clonazepam Not Detected ng/mg creat   7-aminoclonazepam Not Detected ng/mg creat   Midazolam Not Detected ng/mg creat   Alpha-hydroxymidazolam Not Detected ng/mg creat   Flunitrazepam Not Detected ng/mg creat   Desmethylflunitrazepam Not Detected ng/mg creat   COCAINE  METABOLITE IA Negative CUTOFF:150 ng/mL   ETHYL ALCOHOL Enzymatic Negative CUTOFF:0.020 g/dL   CANNABINOIDS IA Negative CUTOFF:20 ng/mL   6-ACETYLMORPHINE IA Negative CUTOFF:10 ng/mL   OPIATE CLASS IA Negative CUTOFF:100 ng/mL   OXYCODONE  CLASS IA Negative CUTOFF:100 ng/mL   METHADONE IA Negative CUTOFF:100 ng/mL   METHADONE MTB IA Negative CUTOFF:100 ng/mL   BUPRENORPHINE  +POSITIVE+    Buprenorphine  324 ng/mg creat   Norbuprenorphine >775 ng/mg creat   FENTANYL  Negative    Fentanyl  Not Detected ng/mg creat   Norfentanyl Not Detected ng/mg creat   TAPENTADOL, IA Negative CUTOFF:200 ng/mL   TRAMADOL  IA Negative CUTOFF:200 ng/mL   BARBITURATES IA Negative CUTOFF:200 ng/mL   PHENCYCLIDINE IA Negative CUTOFF:25 ng/mL   No results found.      Assessment and Plan:   Problem List Items Addressed This Visit       Other   MDD (major depressive disorder), recurrent severe, without psychosis (HCC)   Relevant Medications   hydrOXYzine  (VISTARIL ) 25 MG capsule   Opioid use disorder in remission - Primary   After discussion patient does not feel she got sufficient benefit from sublocade  to stay on it. Plan to continue with SL buprenorphine  for now. UDS from last visit appropriate. F/u in 3 months.       Other Visit Diagnoses       Opioid use disorder, mild, abuse (HCC)           Return in about 3 months (around 08/28/2024) for REACH clinic, OUD f/u.    Total face-to-face time with patient: 15 minutes.  Over 50% of encounter was spent on counseling and coordination of care.  No future appointments.  Donnice CHRISTELLA Carolus, MD/MPH Attending Family Medicine Physician, [facility] for [facility], [facility]

## 2024-05-29 ENCOUNTER — Other Ambulatory Visit: Payer: Self-pay

## 2024-05-30 ENCOUNTER — Encounter: Payer: Self-pay | Admitting: Family Medicine

## 2024-05-30 NOTE — Assessment & Plan Note (Addendum)
 After discussion patient does not feel she got sufficient benefit from sublocade  to stay on it. Plan to continue with SL buprenorphine  for now. UDS from last visit appropriate. F/u in 3 months.

## 2024-06-10 ENCOUNTER — Other Ambulatory Visit (HOSPITAL_BASED_OUTPATIENT_CLINIC_OR_DEPARTMENT_OTHER): Payer: Self-pay

## 2024-06-10 ENCOUNTER — Other Ambulatory Visit (HOSPITAL_COMMUNITY): Payer: Self-pay

## 2024-06-10 ENCOUNTER — Other Ambulatory Visit: Payer: Self-pay

## 2024-06-10 MED ORDER — LAMOTRIGINE 25 MG PO TABS
ORAL_TABLET | ORAL | 0 refills | Status: AC
  Filled 2024-06-10: qty 90, 30d supply, fill #0
  Filled 2024-06-10: qty 80, 27d supply, fill #0

## 2024-06-11 ENCOUNTER — Other Ambulatory Visit: Payer: Self-pay

## 2024-06-19 ENCOUNTER — Encounter: Payer: Self-pay | Admitting: Family Medicine

## 2024-06-20 ENCOUNTER — Other Ambulatory Visit: Payer: Self-pay | Admitting: Advanced Practice Midwife

## 2024-06-20 ENCOUNTER — Other Ambulatory Visit: Payer: Self-pay

## 2024-06-20 DIAGNOSIS — F1191 Opioid use, unspecified, in remission: Secondary | ICD-10-CM

## 2024-06-20 DIAGNOSIS — Z5181 Encounter for therapeutic drug level monitoring: Secondary | ICD-10-CM

## 2024-06-21 ENCOUNTER — Other Ambulatory Visit: Payer: Self-pay

## 2024-06-21 ENCOUNTER — Encounter (HOSPITAL_COMMUNITY): Payer: Self-pay

## 2024-06-21 ENCOUNTER — Other Ambulatory Visit (HOSPITAL_COMMUNITY): Payer: Self-pay

## 2024-06-21 MED ORDER — BUPRENORPHINE HCL-NALOXONE HCL 8-2 MG SL FILM
1.0000 | ORAL_FILM | Freq: Three times a day (TID) | SUBLINGUAL | 0 refills | Status: DC
  Filled 2024-06-21: qty 90, 30d supply, fill #0

## 2024-06-21 NOTE — Progress Notes (Signed)
 Per Dr. Waldon no longer using Sublocade . Dis-enrolling.

## 2024-06-24 ENCOUNTER — Other Ambulatory Visit: Payer: Self-pay

## 2024-06-25 ENCOUNTER — Other Ambulatory Visit (HOSPITAL_COMMUNITY): Payer: Self-pay

## 2024-07-08 ENCOUNTER — Other Ambulatory Visit (HOSPITAL_COMMUNITY): Payer: Self-pay

## 2024-07-16 ENCOUNTER — Other Ambulatory Visit (HOSPITAL_COMMUNITY): Payer: Self-pay

## 2024-07-16 MED ORDER — LAMOTRIGINE 25 MG PO TABS
50.0000 mg | ORAL_TABLET | ORAL | 0 refills | Status: AC
  Filled 2024-07-16: qty 45, 15d supply, fill #0
  Filled 2024-07-16: qty 45, 30d supply, fill #0

## 2024-07-17 ENCOUNTER — Other Ambulatory Visit (HOSPITAL_COMMUNITY): Payer: Self-pay

## 2024-07-17 ENCOUNTER — Other Ambulatory Visit: Payer: Self-pay

## 2024-07-22 ENCOUNTER — Other Ambulatory Visit: Payer: Self-pay

## 2024-07-22 ENCOUNTER — Other Ambulatory Visit: Payer: Self-pay | Admitting: Advanced Practice Midwife

## 2024-07-22 ENCOUNTER — Encounter: Payer: Self-pay | Admitting: Family Medicine

## 2024-07-22 DIAGNOSIS — F1191 Opioid use, unspecified, in remission: Secondary | ICD-10-CM

## 2024-07-22 MED ORDER — SUBOXONE 8-2 MG SL FILM
1.0000 | ORAL_FILM | Freq: Three times a day (TID) | SUBLINGUAL | 0 refills | Status: DC
  Filled 2024-07-22: qty 90, 30d supply, fill #0

## 2024-07-23 ENCOUNTER — Other Ambulatory Visit: Payer: Self-pay

## 2024-07-23 MED ORDER — BUPRENORPHINE HCL-NALOXONE HCL 8-2 MG SL FILM
1.0000 | ORAL_FILM | Freq: Three times a day (TID) | SUBLINGUAL | 0 refills | Status: DC
  Filled 2024-07-23 – 2024-08-19 (×3): qty 90, 30d supply, fill #0

## 2024-07-23 NOTE — Telephone Encounter (Signed)
 UDS appropriate last visit PDMP reviewed Refill sent

## 2024-07-25 ENCOUNTER — Other Ambulatory Visit (HOSPITAL_COMMUNITY): Payer: Self-pay

## 2024-08-07 ENCOUNTER — Encounter: Payer: Self-pay | Admitting: Family Medicine

## 2024-08-08 ENCOUNTER — Other Ambulatory Visit: Payer: Self-pay

## 2024-08-08 ENCOUNTER — Other Ambulatory Visit (HOSPITAL_COMMUNITY): Payer: Self-pay

## 2024-08-08 MED ORDER — LAMOTRIGINE 25 MG PO TABS
ORAL_TABLET | ORAL | 0 refills | Status: AC
  Filled 2024-08-08: qty 270, 90d supply, fill #0

## 2024-08-19 ENCOUNTER — Other Ambulatory Visit: Payer: Self-pay

## 2024-08-19 ENCOUNTER — Other Ambulatory Visit (HOSPITAL_COMMUNITY): Payer: Self-pay

## 2024-08-20 ENCOUNTER — Ambulatory Visit: Admitting: Family Medicine

## 2024-08-26 ENCOUNTER — Other Ambulatory Visit (HOSPITAL_COMMUNITY): Payer: Self-pay

## 2024-09-09 ENCOUNTER — Encounter: Payer: Self-pay | Admitting: Family Medicine

## 2024-09-09 ENCOUNTER — Other Ambulatory Visit: Payer: Self-pay

## 2024-09-09 DIAGNOSIS — F332 Major depressive disorder, recurrent severe without psychotic features: Secondary | ICD-10-CM

## 2024-09-09 DIAGNOSIS — F411 Generalized anxiety disorder: Secondary | ICD-10-CM

## 2024-09-09 DIAGNOSIS — F1191 Opioid use, unspecified, in remission: Secondary | ICD-10-CM

## 2024-09-09 MED ORDER — BUPRENORPHINE HCL-NALOXONE HCL 8-2 MG SL FILM
1.0000 | ORAL_FILM | Freq: Three times a day (TID) | SUBLINGUAL | 0 refills | Status: DC
  Filled 2024-09-09 – 2024-09-17 (×3): qty 90, 30d supply, fill #0

## 2024-09-09 MED ORDER — SERTRALINE HCL 100 MG PO TABS
200.0000 mg | ORAL_TABLET | Freq: Every day | ORAL | 3 refills | Status: DC
  Filled 2024-09-09 – 2024-09-13 (×2): qty 180, 90d supply, fill #0
  Filled 2024-11-01: qty 180, 90d supply, fill #1
  Filled 2024-11-05: qty 180, 90d supply, fill #0
  Filled 2024-11-05: qty 180, 90d supply, fill #1

## 2024-09-13 ENCOUNTER — Other Ambulatory Visit (HOSPITAL_COMMUNITY): Payer: Self-pay

## 2024-09-17 ENCOUNTER — Other Ambulatory Visit (HOSPITAL_COMMUNITY): Payer: Self-pay

## 2024-09-17 ENCOUNTER — Other Ambulatory Visit: Payer: Self-pay

## 2024-09-18 ENCOUNTER — Other Ambulatory Visit (HOSPITAL_BASED_OUTPATIENT_CLINIC_OR_DEPARTMENT_OTHER): Payer: Self-pay

## 2024-10-12 ENCOUNTER — Encounter: Payer: Self-pay | Admitting: Family Medicine

## 2024-10-12 DIAGNOSIS — F1191 Opioid use, unspecified, in remission: Secondary | ICD-10-CM

## 2024-10-14 ENCOUNTER — Encounter: Payer: Self-pay | Admitting: Advanced Practice Midwife

## 2024-10-15 ENCOUNTER — Other Ambulatory Visit (HOSPITAL_COMMUNITY): Payer: Self-pay

## 2024-10-15 ENCOUNTER — Other Ambulatory Visit: Payer: Self-pay | Admitting: Advanced Practice Midwife

## 2024-10-15 ENCOUNTER — Other Ambulatory Visit: Payer: Self-pay

## 2024-10-15 DIAGNOSIS — F1191 Opioid use, unspecified, in remission: Secondary | ICD-10-CM

## 2024-10-15 MED ORDER — BUPRENORPHINE HCL-NALOXONE HCL 8-2 MG SL FILM
1.0000 | ORAL_FILM | Freq: Three times a day (TID) | SUBLINGUAL | 0 refills | Status: AC
  Filled 2024-11-14 – 2024-11-15 (×2): qty 90, 30d supply, fill #0

## 2024-10-15 MED ORDER — BUPRENORPHINE HCL-NALOXONE HCL 8-2 MG SL FILM
1.0000 | ORAL_FILM | Freq: Three times a day (TID) | SUBLINGUAL | 0 refills | Status: DC
  Filled 2024-10-15: qty 90, 30d supply, fill #0

## 2024-10-16 ENCOUNTER — Other Ambulatory Visit (HOSPITAL_COMMUNITY): Payer: Self-pay

## 2024-10-16 ENCOUNTER — Other Ambulatory Visit: Payer: Self-pay

## 2024-11-01 ENCOUNTER — Other Ambulatory Visit (HOSPITAL_COMMUNITY): Payer: Self-pay

## 2024-11-02 ENCOUNTER — Other Ambulatory Visit: Payer: Self-pay | Admitting: Family Medicine

## 2024-11-02 ENCOUNTER — Other Ambulatory Visit (HOSPITAL_COMMUNITY): Payer: Self-pay

## 2024-11-02 DIAGNOSIS — F411 Generalized anxiety disorder: Secondary | ICD-10-CM

## 2024-11-02 DIAGNOSIS — F332 Major depressive disorder, recurrent severe without psychotic features: Secondary | ICD-10-CM

## 2024-11-05 ENCOUNTER — Other Ambulatory Visit (HOSPITAL_COMMUNITY): Payer: Self-pay

## 2024-11-05 ENCOUNTER — Other Ambulatory Visit: Payer: Self-pay

## 2024-11-05 ENCOUNTER — Other Ambulatory Visit: Payer: Self-pay | Admitting: Advanced Practice Midwife

## 2024-11-05 DIAGNOSIS — F332 Major depressive disorder, recurrent severe without psychotic features: Secondary | ICD-10-CM

## 2024-11-05 DIAGNOSIS — F411 Generalized anxiety disorder: Secondary | ICD-10-CM

## 2024-11-05 MED ORDER — SERTRALINE HCL 100 MG PO TABS
200.0000 mg | ORAL_TABLET | Freq: Every day | ORAL | 0 refills | Status: AC
  Filled 2024-11-05: qty 60, 30d supply, fill #0

## 2024-11-05 NOTE — Telephone Encounter (Signed)
 Patient lost Zoloft . I called pharmacy to follow up on refill. They need approval from prescriber's office due to prescription being misplaced in November 2025 and also needing an early refill at that time. Will need to pay out of pocket for refill. Patient notified refill has been approved.

## 2024-11-06 ENCOUNTER — Other Ambulatory Visit (HOSPITAL_COMMUNITY): Payer: Self-pay

## 2024-11-07 ENCOUNTER — Encounter: Admitting: Family Medicine

## 2024-11-14 ENCOUNTER — Other Ambulatory Visit: Payer: Self-pay

## 2024-11-15 ENCOUNTER — Other Ambulatory Visit (HOSPITAL_COMMUNITY): Payer: Self-pay

## 2024-11-18 ENCOUNTER — Other Ambulatory Visit: Payer: Self-pay
